# Patient Record
Sex: Male | Born: 1950 | Race: White | Hispanic: No | Marital: Married | Smoking: Former smoker
Health system: Southern US, Community
[De-identification: ages and names within clinical notes are randomized; demographics above are authoritative.]

## PROBLEM LIST (undated history)

## (undated) DIAGNOSIS — I25110 Atherosclerotic heart disease of native coronary artery with unstable angina pectoris: Secondary | ICD-10-CM

## (undated) DIAGNOSIS — I350 Nonrheumatic aortic (valve) stenosis: Secondary | ICD-10-CM

## (undated) DIAGNOSIS — E785 Hyperlipidemia, unspecified: Secondary | ICD-10-CM

## (undated) DIAGNOSIS — R011 Cardiac murmur, unspecified: Secondary | ICD-10-CM

## (undated) DIAGNOSIS — H269 Unspecified cataract: Secondary | ICD-10-CM

## (undated) DIAGNOSIS — E119 Type 2 diabetes mellitus without complications: Secondary | ICD-10-CM

## (undated) DIAGNOSIS — I10 Essential (primary) hypertension: Secondary | ICD-10-CM

## (undated) DIAGNOSIS — M1990 Unspecified osteoarthritis, unspecified site: Secondary | ICD-10-CM

## (undated) DIAGNOSIS — J45909 Unspecified asthma, uncomplicated: Secondary | ICD-10-CM

## (undated) HISTORY — DX: Atherosclerotic heart disease of native coronary artery with unstable angina pectoris: I25.110

## (undated) HISTORY — DX: Type 2 diabetes mellitus without complications: E11.9

## (undated) HISTORY — DX: Nonrheumatic aortic (valve) stenosis: I35.0

## (undated) HISTORY — DX: Unspecified asthma, uncomplicated: J45.909

## (undated) HISTORY — PX: COLONOSCOPY: SHX174

## (undated) HISTORY — DX: Essential (primary) hypertension: I10

## (undated) HISTORY — PX: CARDIAC CATHETERIZATION: SHX172

## (undated) HISTORY — DX: Unspecified cataract: H26.9

## (undated) HISTORY — DX: Cardiac murmur, unspecified: R01.1

## (undated) HISTORY — DX: Hyperlipidemia, unspecified: E78.5

## (undated) HISTORY — PX: WISDOM TOOTH EXTRACTION: SHX21

---
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
## 1998-08-12 ENCOUNTER — Ambulatory Visit (HOSPITAL_COMMUNITY): Admission: RE | Admit: 1998-08-12 | Discharge: 1998-08-12 | Payer: Self-pay | Admitting: *Deleted

## 1998-08-12 ENCOUNTER — Encounter: Payer: Self-pay | Admitting: *Deleted

## 1998-09-27 ENCOUNTER — Encounter: Admission: RE | Admit: 1998-09-27 | Discharge: 1998-12-26 | Payer: Self-pay | Admitting: *Deleted

## 2012-08-21 ENCOUNTER — Ambulatory Visit (INDEPENDENT_AMBULATORY_CARE_PROVIDER_SITE_OTHER): Payer: No Typology Code available for payment source | Admitting: Cardiovascular Disease

## 2012-08-21 ENCOUNTER — Encounter: Payer: Self-pay | Admitting: Cardiovascular Disease

## 2012-08-21 VITALS — BP 123/80 | HR 87 | Ht 69.0 in | Wt 230.0 lb

## 2012-08-21 DIAGNOSIS — E785 Hyperlipidemia, unspecified: Secondary | ICD-10-CM

## 2012-08-21 DIAGNOSIS — Z7189 Other specified counseling: Secondary | ICD-10-CM

## 2012-08-21 DIAGNOSIS — Z7689 Persons encountering health services in other specified circumstances: Secondary | ICD-10-CM | POA: Insufficient documentation

## 2012-08-21 DIAGNOSIS — E1165 Type 2 diabetes mellitus with hyperglycemia: Secondary | ICD-10-CM | POA: Insufficient documentation

## 2012-08-21 DIAGNOSIS — I10 Essential (primary) hypertension: Secondary | ICD-10-CM | POA: Insufficient documentation

## 2012-08-21 DIAGNOSIS — R42 Dizziness and giddiness: Secondary | ICD-10-CM

## 2012-08-21 DIAGNOSIS — E119 Type 2 diabetes mellitus without complications: Secondary | ICD-10-CM

## 2012-08-21 DIAGNOSIS — E782 Mixed hyperlipidemia: Secondary | ICD-10-CM | POA: Insufficient documentation

## 2012-08-21 NOTE — Patient Instructions (Addendum)
You are doing well. No medication changes were made.  If dizziness gets worse, consider cutting the losartan in 1/2 (25 mg daily) Call the office if you have any worsening Shortness of breath, chest pain  Please call us if you have new issues that need to be addressed before your next appt.  Your physician wants you to follow-up in: 12 months.  You will receive a reminder letter in the mail two months in advance. If you don't receive a letter, please call our office to schedule the follow-up appointment.

## 2012-08-21 NOTE — Assessment & Plan Note (Signed)
Records were requested from [facility].

## 2012-08-21 NOTE — Assessment & Plan Note (Addendum)
He states that diabetes numbers are slowly improving, still not at baseline. Dietary guide was given him today.

## 2012-08-21 NOTE — Assessment & Plan Note (Signed)
We have suggested he stay on his cholesterol medication.

## 2012-08-21 NOTE — Assessment & Plan Note (Signed)
We have encouraged continued exercise, careful diet management in an effort to lose weight. 

## 2012-08-21 NOTE — Assessment & Plan Note (Signed)
Orthostatic dizziness, worse when he bends over and then stands up. Uncertain if he is holding his breath when he bends over but he does have obesity which could be contributing to his symptoms. I suggested that he try not to bend at the waist and rather bent at the knees. No significant symptoms when upright and exerting himself. If symptoms get worse, I suggested he stay hydrated, cut his losartan in half.

## 2012-08-21 NOTE — Assessment & Plan Note (Signed)
Blood pressure is well controlled. If he continues to have dizziness with bending over, perhaps we could wean down the dose of his losartan.

## 2012-08-21 NOTE — Progress Notes (Signed)
Patient ID: Chase Saunders, male    DOB: 04/07/1950, 62 y.o.   MRN: [id number]  HPI Comments: 62 year old male who recently moved to the area from [place], with history of smoking for 20-25 years who stopped 20 years ago, history of diabetes, obesity, hypertension, who presents to establish care in our office.   He states that he has had some recent malaise of uncertain etiology. He attributes this to being off his medications for several months. For a period of time, he did not have insurance and was unable to afford his medications. Now he has insurance and is back on his medications. Hemoglobin A1c was previously in the 6 range, climbed up to the 9 range and reports it is better now. It is still not back to its baseline.   He reports having some lightheadedness and shortness of breath when he bends over and then stands up again. He walks his dog and when he bends over to help his dog or pick up something, with standing back up, he is symptomatic for several seconds.   Otherwise with activity such as walking his dog he has no symptoms. He is able to walk reasonable distances without any symptoms he reports having a stress test 4-5 years ago for chest pain that showed no abnormality.   He does report having a significant family history. Parents died of cancer in her 29s. Grandparents had coronary artery disease  EKG shows normal sinus rhythm with rate 87 beats a minute, no significant ST or T wave changes    Outpatient Encounter Prescriptions as of 08/21/2012  Medication Sig Dispense Refill  . aspirin EC 81 MG tablet Take 81 mg by mouth daily.      . Chromium Picolinate 1000 MCG TABS Take by mouth daily.      . Chromium-Cinnamon [phone] MCG-MG CAPS Take by mouth daily.      . citalopram (CELEXA) 20 MG tablet Take 20 mg by mouth daily.      . Fish Oil-Cholecalciferol (OMEGA-3 FISH OIL-VITAMIN D3) [phone] MG-UNIT CAPS Take by mouth daily.      . Glucosamine-Chondroitin-Vit D3  1500-1200-800 MG-MG-UNIT PACK Take by mouth daily.      . insulin detemir (LEVEMIR) 100 unit/ml SOLN Inject 68 Units into the skin daily at 10 pm.      . losartan (COZAAR) 50 MG tablet Take 50 mg by mouth daily.      Marland Kitchen lovastatin (MEVACOR) 40 MG tablet Take 40 mg by mouth at bedtime.      . Magnesium 400 MG CAPS Take by mouth daily.      . metFORMIN (GLUMETZA) 1000 MG (MOD) 24 hr tablet Take 1,000 mg by mouth daily with breakfast.      . Multiple Vitamins-Minerals (MENS MULTIVITAMIN PLUS) TABS Take by mouth daily.      . Triamcinolone Acetonide (NASACORT ALLERGY 24HR) 55 MCG/ACT AERO Place into the nose daily.         Review of Systems  Constitutional: Negative.   HENT: Negative.   Eyes: Negative.   Respiratory: Negative.   Cardiovascular: Negative.   Gastrointestinal: Negative.   Musculoskeletal: Negative.   Skin: Negative.   Neurological: Positive for dizziness.  Psychiatric/Behavioral: Negative.   All other systems reviewed and are negative.    BP 123/80  Pulse 87  Ht 5\' 9"  (1.753 m)  Wt 230 lb (104.327 kg)  BMI 33.95 kg/m2  Physical Exam  Nursing note and vitals reviewed. Constitutional: He is oriented to person, place,  and time. He appears well-developed and well-nourished.  HENT:  Head: Normocephalic.  Nose: Nose normal.  Mouth/Throat: Oropharynx is clear and moist.  Eyes: Conjunctivae are normal. Pupils are equal, round, and reactive to light.  Neck: Normal range of motion. Neck supple. No JVD present.  Cardiovascular: Normal rate, regular rhythm, S1 normal, S2 normal, normal heart sounds and intact distal pulses.  Exam reveals no gallop and no friction rub.   No murmur heard. Pulmonary/Chest: Effort normal and breath sounds normal. No respiratory distress. He has no wheezes. He has no rales. He exhibits no tenderness.  Abdominal: Soft. Bowel sounds are normal. He exhibits no distension. There is no tenderness.  Musculoskeletal: Normal range of motion. He exhibits  no edema and no tenderness.  Lymphadenopathy:    He has no cervical adenopathy.  Neurological: He is alert and oriented to person, place, and time. Coordination normal.  Skin: Skin is warm and dry. No rash noted. No erythema.  Psychiatric: He has a normal mood and affect. His behavior is normal. Judgment and thought content normal.      Assessment and Plan

## 2013-01-28 ENCOUNTER — Encounter: Payer: Self-pay | Admitting: Adult Health

## 2013-01-28 ENCOUNTER — Encounter (INDEPENDENT_AMBULATORY_CARE_PROVIDER_SITE_OTHER): Payer: Self-pay

## 2013-01-28 ENCOUNTER — Ambulatory Visit (INDEPENDENT_AMBULATORY_CARE_PROVIDER_SITE_OTHER): Payer: No Typology Code available for payment source | Admitting: Adult Health

## 2013-01-28 VITALS — BP 126/80 | HR 100 | Temp 97.8°F | Resp 14 | Ht 69.0 in | Wt 229.0 lb

## 2013-01-28 DIAGNOSIS — Z7689 Persons encountering health services in other specified circumstances: Secondary | ICD-10-CM

## 2013-01-28 DIAGNOSIS — E119 Type 2 diabetes mellitus without complications: Secondary | ICD-10-CM

## 2013-01-28 DIAGNOSIS — E785 Hyperlipidemia, unspecified: Secondary | ICD-10-CM

## 2013-01-28 DIAGNOSIS — R42 Dizziness and giddiness: Secondary | ICD-10-CM

## 2013-01-28 DIAGNOSIS — I10 Essential (primary) hypertension: Secondary | ICD-10-CM

## 2013-01-28 DIAGNOSIS — Z79899 Other long term (current) drug therapy: Secondary | ICD-10-CM

## 2013-01-28 DIAGNOSIS — Z7189 Other specified counseling: Secondary | ICD-10-CM

## 2013-01-28 NOTE — Assessment & Plan Note (Signed)
Check lipids. He will return for fasting labs.

## 2013-01-28 NOTE — Assessment & Plan Note (Addendum)
Symptoms occur occasionally. Currently he is asymptomatic. Suspect this may be related to drop in blood pressure with quick changes of position. Patient has been advised to change positions slowly. Maintain good hydration. Check metabolic panel

## 2013-01-28 NOTE — Progress Notes (Signed)
Pre visit review using our clinic review tool, if applicable. No additional management support is needed unless otherwise documented below in the visit note. 

## 2013-01-28 NOTE — Assessment & Plan Note (Signed)
Patient brought in medical records from previous PCP. Records reviewed and set to be scanned.

## 2013-01-28 NOTE — Patient Instructions (Signed)
   Thank you for choosing [facility] for your health care needs.  Please have your labs drawn at your earliest convenience. You will need to fast. You may drink water.  The results will be available through MyChart for your convenience. Please remember to activate this. The activation code is located at the end of this form.

## 2013-01-28 NOTE — Progress Notes (Signed)
Subjective:    Patient ID: Chase Saunders, male    DOB: Oct 10, 1950, 62 y.o.   MRN: [id number]  HPI  Pt is a pleasant 62 y/o male who presents to establish care. He was previously followed by Dr. Richard Miu in [place]. He moved back to the [place] area last year. He reports occasionally feeling lightheaded and dizziness with rapid change of positions. He has a hx of HTN but reports well controlled with medication. Also hx of diabetes but admits this is not as well controlled. Diet with some indiscretions. Otherwise he is feeling well.    Past Medical History  Diagnosis Date  . Asthma     Childhood  . Diabetes mellitus without complication   . Heart murmur   . Hypertension   . Hyperlipidemia      Past Surgical History  Procedure Laterality Date  . Tonsillectomy and adenoidectomy  1969     Family History  Problem Relation Age of Onset  . Alcohol abuse Mother   . Hypertension Mother   . Cancer Father 39    Lung Cancer - smoker  . Heart disease Father     Valve Replacement  . Diabetes Paternal Grandmother      History   Social History  . Marital Status: Married    Spouse Name: Rinaldo Cloud    Number of Children: 2  . Years of Education: 16   Occupational History  . Voice Over Artist     [employer]   Social History Main Topics  . Smoking status: Former Smoker -- 1.50 packs/day for 25 years    Quit date: 03/30/1997  . Smokeless tobacco: Not on file  . Alcohol Use: No  . Drug Use: No  . Sexual Activity: Not on file   Other Topics Concern  . Not on file   Social History Narrative   [name] grew up in [place], [place]. He attended [employer] and obtained his Bachelors in Special educational needs teacher. He moved to the area from [place], [place]. He lives at home with his wife. They have a son and daughter. They have 4 grandchildren (3 grandson, 1 granddaughter). 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 He enjoys fishing. He also enjoys  watching movies. He really considers himself a "home body".     Review of Systems  Constitutional: Negative.   HENT: Negative.   Eyes: Negative.   Respiratory: Negative.   Cardiovascular: Negative.   Gastrointestinal: Negative.   Endocrine: Negative.   Genitourinary: Negative.   Musculoskeletal: Positive for arthralgias.       Arthritis right hand ring finger.  Skin: Negative.   Allergic/Immunologic: Negative.   Neurological: Positive for dizziness and light-headedness.       Occasional episodes of low blood sugar.  Hematological: Negative.   Psychiatric/Behavioral: Negative.        Objective:   Physical Exam  Constitutional: He is oriented to person, place, and time.  Overweight pleasant male in no apparent distress  HENT:  Head: Normocephalic and atraumatic.  Right Ear: External ear normal.  Left Ear: External ear normal.  Mouth/Throat: Oropharynx is clear and moist. No oropharyngeal exudate.  Eyes: Conjunctivae and EOM are normal. Pupils are equal, round, and reactive to light.  Neck: Normal range of motion. No thyromegaly present.  Cardiovascular: Normal rate, regular rhythm and normal heart sounds.  Exam reveals no gallop and no friction rub.   No murmur heard. HR 88  Pulmonary/Chest: Effort normal and breath sounds normal.  No respiratory distress. He has no wheezes. He has no rales.  Abdominal: Soft. Bowel sounds are normal.  Musculoskeletal: Normal range of motion. He exhibits no edema and no tenderness.  Lymphadenopathy:    He has no cervical adenopathy.  Neurological: He is alert and oriented to person, place, and time.  Skin: Skin is warm and dry.  Psychiatric: He has a normal mood and affect. His behavior is normal. Judgment and thought content normal.          Assessment & Plan:

## 2013-01-28 NOTE — Assessment & Plan Note (Addendum)
Reports dietary indiscretions. Check hemoglobin A1c and urine microalbumin. Currently on metformin and Levemir 72 units in the morning

## 2013-01-31 ENCOUNTER — Other Ambulatory Visit (INDEPENDENT_AMBULATORY_CARE_PROVIDER_SITE_OTHER): Payer: No Typology Code available for payment source

## 2013-01-31 DIAGNOSIS — E119 Type 2 diabetes mellitus without complications: Secondary | ICD-10-CM

## 2013-01-31 DIAGNOSIS — E785 Hyperlipidemia, unspecified: Secondary | ICD-10-CM

## 2013-01-31 DIAGNOSIS — Z79899 Other long term (current) drug therapy: Secondary | ICD-10-CM

## 2013-01-31 LAB — CBC WITH DIFFERENTIAL/PLATELET
Basophils Absolute: 0.1 10*3/uL (ref 0.0–0.1)
Basophils Relative: 0.5 % (ref 0.0–3.0)
Eosinophils Absolute: 0.3 10*3/uL (ref 0.0–0.7)
Eosinophils Relative: 3.2 % (ref 0.0–5.0)
HCT: 42.1 % (ref 39.0–52.0)
Hemoglobin: 14.1 g/dL (ref 13.0–17.0)
Lymphocytes Relative: 24 % (ref 12.0–46.0)
Lymphs Abs: 2.4 10*3/uL (ref 0.7–4.0)
MCHC: 33.5 g/dL (ref 30.0–36.0)
MCV: 94.1 fl (ref 78.0–100.0)
Monocytes Absolute: 1 10*3/uL (ref 0.1–1.0)
Monocytes Relative: 9.4 % (ref 3.0–12.0)
Neutro Abs: 6.4 10*3/uL (ref 1.4–7.7)
Neutrophils Relative %: 62.9 % (ref 43.0–77.0)
Platelets: 296 10*3/uL (ref 150.0–400.0)
RBC: 4.47 Mil/uL (ref 4.22–5.81)
RDW: 13.7 % (ref 11.5–14.6)
WBC: 10.1 10*3/uL (ref 4.5–10.5)

## 2013-01-31 LAB — COMPREHENSIVE METABOLIC PANEL
ALT: 51 U/L (ref 0–53)
AST: 29 U/L (ref 0–37)
Albumin: 4 g/dL (ref 3.5–5.2)
Alkaline Phosphatase: 84 U/L (ref 39–117)
BUN: 15 mg/dL (ref 6–23)
CO2: 30 mEq/L (ref 19–32)
Calcium: 8.8 mg/dL (ref 8.4–10.5)
Chloride: 101 mEq/L (ref 96–112)
Creatinine, Ser: 1 mg/dL (ref 0.4–1.5)
GFR: 83.08 mL/min (ref >60.00)
Glucose, Bld: 113 mg/dL — ABNORMAL HIGH (ref 70–99)
Potassium: 4.6 mEq/L (ref 3.5–5.1)
Sodium: 137 mEq/L (ref 135–145)
Total Bilirubin: 0.6 mg/dL (ref 0.3–1.2)
Total Protein: 6.6 g/dL (ref 6.0–8.3)

## 2013-01-31 LAB — LIPID PANEL
Cholesterol: 146 mg/dL (ref 0–200)
HDL: 43.3 mg/dL (ref >39.00)
LDL Cholesterol: 80 mg/dL (ref 0–99)
Total CHOL/HDL Ratio: 3
Triglycerides: 115 mg/dL (ref 0.0–149.0)
VLDL: 23 mg/dL (ref 0.0–40.0)

## 2013-01-31 LAB — HEMOGLOBIN A1C: Hgb A1c MFr Bld: 8.4 % — ABNORMAL HIGH (ref 4.6–6.5)

## 2013-01-31 LAB — MICROALBUMIN / CREATININE URINE RATIO
Creatinine,U: 165.7 mg/dL
Microalb Creat Ratio: 0.2 mg/g (ref 0.0–30.0)
Microalb, Ur: 0.3 mg/dL (ref 0.0–1.9)

## 2013-02-03 ENCOUNTER — Encounter: Payer: Self-pay | Admitting: Adult Health

## 2013-02-04 NOTE — Telephone Encounter (Signed)
Pt came by office and wanted to let you know that he was going to increase dosage of the metformin.

## 2013-03-21 ENCOUNTER — Telehealth: Payer: Self-pay | Admitting: Adult Health

## 2013-03-21 NOTE — Telephone Encounter (Signed)
Pt dropped off paperwork.  Placed in Raquel's chart.  Pt dropped off new pt packet for his ?daughter, Frederik PearKristen Lickteig.  Kristen Erbes has not scheduled an appt.  Lu DuffelStuart Woollard did not wish to schedule an appt for her.  Mr. Jessy Otoorfleet stated that we needed to request Kristen's previous records before her appt.  Advised Mr. Jessy Otoorfleet that would be done at New pt visit and that Baxter HireKristen would need to call to set up an appt.  Mr. Jessy Otoorfleet walked out.  NPP for Kristen Mosher in pink folder up front.

## 2013-04-04 ENCOUNTER — Other Ambulatory Visit: Payer: Self-pay | Admitting: Adult Health

## 2013-04-16 ENCOUNTER — Encounter: Payer: Self-pay | Admitting: Adult Health

## 2013-05-06 ENCOUNTER — Other Ambulatory Visit: Payer: Self-pay | Admitting: Adult Health

## 2013-05-07 ENCOUNTER — Other Ambulatory Visit: Payer: Self-pay | Admitting: Adult Health

## 2013-05-07 MED ORDER — LOSARTAN POTASSIUM 50 MG PO TABS
50.0000 mg | ORAL_TABLET | Freq: Every day | ORAL | Status: DC
Start: 2013-05-07 — End: 2013-10-01

## 2013-05-07 NOTE — Telephone Encounter (Signed)
Spoke with pt about Metformin, refill request came in as 3 per day at 500mg , Pt states that you and him talked about him needing to take 4 per day.  Please advise

## 2013-05-08 ENCOUNTER — Other Ambulatory Visit: Payer: Self-pay | Admitting: Adult Health

## 2013-05-08 MED ORDER — METFORMIN HCL ER 500 MG PO TB24: ORAL_TABLET | ORAL | Status: DC

## 2013-06-03 ENCOUNTER — Telehealth: Payer: Self-pay | Admitting: *Deleted

## 2013-06-03 MED ORDER — INSULIN PEN NEEDLE 31G X 8 MM MISC: Status: AC

## 2013-06-03 NOTE — Telephone Encounter (Signed)
Pharmacy Note:  Need new Rx for b-d needles for levemir.

## 2013-06-25 ENCOUNTER — Other Ambulatory Visit: Payer: Self-pay | Admitting: Adult Health

## 2013-09-24 ENCOUNTER — Other Ambulatory Visit: Payer: Self-pay | Admitting: Adult Health

## 2013-09-24 NOTE — Telephone Encounter (Signed)
Sent mychart message on need for appt 

## 2013-10-01 ENCOUNTER — Ambulatory Visit (INDEPENDENT_AMBULATORY_CARE_PROVIDER_SITE_OTHER): Payer: BC Managed Care – PPO | Admitting: Adult Health

## 2013-10-01 ENCOUNTER — Encounter: Payer: Self-pay | Admitting: Adult Health

## 2013-10-01 VITALS — BP 111/74 | HR 96 | Temp 98.4°F | Resp 14 | Ht 69.0 in | Wt 227.0 lb

## 2013-10-01 DIAGNOSIS — R011 Cardiac murmur, unspecified: Secondary | ICD-10-CM

## 2013-10-01 DIAGNOSIS — IMO0002 Reserved for concepts with insufficient information to code with codable children: Secondary | ICD-10-CM

## 2013-10-01 DIAGNOSIS — IMO0001 Reserved for inherently not codable concepts without codable children: Secondary | ICD-10-CM

## 2013-10-01 DIAGNOSIS — E1165 Type 2 diabetes mellitus with hyperglycemia: Secondary | ICD-10-CM

## 2013-10-01 LAB — HM DIABETES FOOT EXAM: HM Diabetic Foot Exam: NORMAL

## 2013-10-01 LAB — HEMOGLOBIN A1C: Hgb A1c MFr Bld: 8.4 % — ABNORMAL HIGH (ref 4.6–6.5)

## 2013-10-01 NOTE — Progress Notes (Signed)
Patient ID: Chase Saunders, male   DOB: 09-19-1950, 63 y.o.   MRN: [id number]   Subjective:    Patient ID: Chase Saunders, male    DOB: 09-19-1950, 63 y.o.   MRN: [id number]  HPI  Pt presents for DM type 2 f/u. Last A1c was 8.4%. He is currently taking metformin 500 mg 1 tablet with breakfast and 1 with dinner. The order is for 1000 mg bid. He is injecting Levemir 80 units every morning. Not checking his blood glucoses regularly 2/2 painful fingers. Some ongoing dietary indiscretions but he reports that he and his wife have spoken and they are determined to improve their diet. Having trouble exercising. He normally walks but he reports it has been too hot. Takes his grandson to school and has to wake up early to do that. By the time he returns the temperature is unbearable.  Past Medical History  Diagnosis Date  . Asthma     Childhood  . Diabetes mellitus without complication   . Heart murmur   . Hypertension   . Hyperlipidemia   . Cataract     Current Outpatient Prescriptions on File Prior to Visit  Medication Sig Dispense Refill  . aspirin EC 81 MG tablet Take 81 mg by mouth daily.      . Chromium-Cinnamon [phone] MCG-MG CAPS Take by mouth daily.      . citalopram (CELEXA) 20 MG tablet TAKE 1 TABLET BY MOUTH EVERY DAY  30 tablet  0  . Fish Oil-Cholecalciferol (OMEGA-3 FISH OIL-VITAMIN D3) [id number] MG-UNIT CAPS Take by mouth daily.      . insulin detemir (LEVEMIR) 100 unit/ml SOLN Inject 80 Units into the skin every morning.       . Insulin Pen Needle 31G X 8 MM MISC Use as directed  100 each  3  . lovastatin (MEVACOR) 40 MG tablet TAKE 1 TABLET BY MOUTH AT BEDTIME  90 tablet  1  . Multiple Vitamins-Minerals (MENS MULTIVITAMIN PLUS) TABS Take by mouth daily.       No current facility-administered medications on file prior to visit.     Review of Systems  Constitutional: Positive for fatigue.  Respiratory: Positive for shortness of breath (with exertion).     Cardiovascular: Negative for chest pain, palpitations and leg swelling.  Gastrointestinal: Negative.   Endocrine: Negative.   Musculoskeletal: Negative.   Neurological: Positive for light-headedness (with bending at waist).  Psychiatric/Behavioral: Negative.   All other systems reviewed and are negative.      Objective:  BP 111/74  Pulse 96  Temp(Src) 98.4 F (36.9 C) (Oral)  Resp 14  Ht 5\' 9"  (1.753 m)  Wt 227 lb (102.967 kg)  BMI 33.51 kg/m2  SpO2 94%   Physical Exam  Constitutional: He is oriented to person, place, and time. He appears well-developed and well-nourished. No distress.  HENT:  Head: Normocephalic and atraumatic.  Eyes: Conjunctivae and EOM are normal.  Cardiovascular: Normal rate, regular rhythm and intact distal pulses.  Exam reveals no gallop and no friction rub.   Murmur heard. Systolic murmur  Pulmonary/Chest: Effort normal and breath sounds normal. No respiratory distress. He has no wheezes. He has no rales.  Musculoskeletal: Normal range of motion.  Neurological: He is alert and oriented to person, place, and time.  Skin: Skin is warm and dry.  Psychiatric: He has a normal mood and affect. His behavior is normal. Judgment and thought content normal.          Assessment &  Plan:   1. Diabetes type 2, uncontrolled Referring to [facility] for diabetic education. Provided with several pieces of literature for diabetes management, diet management. Check A1c. Adjust meds if necessary. We had long discussion about poorly controlled diabetes and end organ damage. I continue to encourage him about controlling his diabetes. Follow up in 3 month. If still not controlled will refer to Endocrine. - Hemoglobin A1c - Ambulatory referral to diabetic education  2. Heart murmur, systolic Aortic murmur, systolic. He is experiencing some lightheadedness with bending over and shortness of breath with exertion; worse with going up stairs. Last echo was "many  years ago". He is followed by Dr. Mariah Milling. I will order echo to be done at Dr. Windell Hummingbird office and follow up with him if necessary. - 2D Echocardiogram without contrast; Future

## 2013-10-01 NOTE — Progress Notes (Signed)
Pre visit review using our clinic review tool, if applicable. No additional management support is needed unless otherwise documented below in the visit note. 

## 2013-10-02 ENCOUNTER — Other Ambulatory Visit: Payer: Self-pay | Admitting: Adult Health

## 2013-10-02 ENCOUNTER — Encounter: Payer: Self-pay | Admitting: Adult Health

## 2013-10-02 MED ORDER — GLYBURIDE 5 MG PO TABS: 5.0000 mg | ORAL_TABLET | Freq: Every day | ORAL | Status: DC

## 2013-10-24 ENCOUNTER — Other Ambulatory Visit (INDEPENDENT_AMBULATORY_CARE_PROVIDER_SITE_OTHER): Payer: BC Managed Care – PPO

## 2013-10-24 ENCOUNTER — Ambulatory Visit (INDEPENDENT_AMBULATORY_CARE_PROVIDER_SITE_OTHER): Payer: BC Managed Care – PPO | Admitting: Cardiovascular Disease

## 2013-10-24 ENCOUNTER — Encounter: Payer: Self-pay | Admitting: Cardiovascular Disease

## 2013-10-24 ENCOUNTER — Other Ambulatory Visit: Payer: Self-pay

## 2013-10-24 VITALS — BP 120/72 | HR 80 | Ht 70.0 in | Wt 225.2 lb

## 2013-10-24 DIAGNOSIS — R0602 Shortness of breath: Secondary | ICD-10-CM

## 2013-10-24 DIAGNOSIS — R42 Dizziness and giddiness: Secondary | ICD-10-CM

## 2013-10-24 DIAGNOSIS — I359 Nonrheumatic aortic valve disorder, unspecified: Secondary | ICD-10-CM

## 2013-10-24 DIAGNOSIS — R011 Cardiac murmur, unspecified: Secondary | ICD-10-CM

## 2013-10-24 DIAGNOSIS — I10 Essential (primary) hypertension: Secondary | ICD-10-CM

## 2013-10-24 DIAGNOSIS — E785 Hyperlipidemia, unspecified: Secondary | ICD-10-CM

## 2013-10-24 DIAGNOSIS — E119 Type 2 diabetes mellitus without complications: Secondary | ICD-10-CM

## 2013-10-24 NOTE — Assessment & Plan Note (Signed)
Blood pressure is well controlled on today's visit. No changes made to the medications. 

## 2013-10-24 NOTE — Assessment & Plan Note (Signed)
Recommended he restart 3 metformin per day. He will continue to monitor his sugars. These run high, he we'll restart one half pill glyburide

## 2013-10-24 NOTE — Progress Notes (Signed)
Patient ID: Chase Saunders, male    DOB: 11-07-1950, 64 y.o.   MRN: [id number]  HPI Comments: 63 year old male who recently moved to the area from [place], with history of smoking for 20-25 years who stopped 20 years ago, history of diabetes, obesity, hypertension, who presents   Prelim echocardiogram done today shows mild to moderate aortic valve stenosis, normal ejection fraction. Final reading is pending  In followup today, he reports that he is doing well. Denies having any significant chest pain with exertion. He does have mild chronic shortness of breath with exertion, worse with hills and stairs.  Brother recently had bypass surgery  Is working on his diabetes. Increased his metformin up to 3 pills per day, started glyburide and reports that his sugar dropped several times. He has since stopped the glyburide and went back to the low-dose metformin.  Hemoglobin A1c 8.4  On his prior clinic visit, reported having some lightheadedness and shortness of breath when he bends over and then stands up again.  He walks his dog and when he bends over to help his dog or pick up something, with standing back up, he is symptomatic for several seconds.    He does report having a significant family history. Parents died of cancer in her 27s. Grandparents had coronary artery disease  EKG shows normal sinus rhythm with rate 80 beats a minute, no significant ST or T wave changes    Outpatient Encounter Prescriptions as of 10/24/2013  Medication Sig  . aspirin EC 81 MG tablet Take 81 mg by mouth daily.  . Chromium-Cinnamon [phone] MCG-MG CAPS Take by mouth daily.  . citalopram (CELEXA) 20 MG tablet TAKE 1 TABLET BY MOUTH EVERY DAY  . Fish Oil-Cholecalciferol (OMEGA-3 FISH OIL-VITAMIN D3) [phone] MG-UNIT CAPS Take by mouth daily.  . insulin detemir (LEVEMIR) 100 unit/ml SOLN Inject 80 Units into the skin every morning.   . Insulin Pen Needle 31G X 8 MM MISC Use as directed  . losartan  (COZAAR) 50 MG tablet Take 25 mg by mouth daily.  Marland Kitchen lovastatin (MEVACOR) 40 MG tablet TAKE 1 TABLET BY MOUTH AT BEDTIME  . metFORMIN (GLUCOPHAGE-XR) 500 MG 24 hr tablet Take 500 mg by mouth daily with breakfast.  He takes 2 per day   . Misc Natural Products (OSTEO BI-FLEX JOINT SHIELD PO) Take by mouth 3 (three) times daily.  . Multiple Vitamins-Minerals (MENS MULTIVITAMIN PLUS) TABS Take by mouth daily.  Marland Kitchen  glyBURIDE (DIABETA) 5 MG tablet Take 1 tablet (5 mg total) by mouth daily with breakfast. Currently is not taking      Review of Systems  Constitutional: Negative.   HENT: Negative.   Eyes: Negative.   Respiratory: Positive for shortness of breath.   Cardiovascular: Negative.   Gastrointestinal: Negative.   Endocrine: Negative.   Musculoskeletal: Negative.   Skin: Negative.   Allergic/Immunologic: Negative.   Neurological: Negative.   Hematological: Negative.   Psychiatric/Behavioral: Negative.   All other systems reviewed and are negative.   BP 120/72  Pulse 80  Ht 5\' 10"  (1.778 m)  Wt 225 lb 4 oz (102.173 kg)  BMI 32.32 kg/m2  Physical Exam  Nursing note and vitals reviewed. Constitutional: He is oriented to person, place, and time. He appears well-developed and well-nourished.  HENT:  Head: Normocephalic.  Nose: Nose normal.  Mouth/Throat: Oropharynx is clear and moist.  Eyes: Conjunctivae are normal. Pupils are equal, round, and reactive to light.  Neck: Normal range of motion. Neck supple.  No JVD present.  Cardiovascular: Normal rate, regular rhythm, S1 normal, S2 normal, normal heart sounds and intact distal pulses.  Exam reveals no gallop and no friction rub.   No murmur heard. Pulmonary/Chest: Effort normal and breath sounds normal. No respiratory distress. He has no wheezes. He has no rales. He exhibits no tenderness.  Abdominal: Soft. Bowel sounds are normal. He exhibits no distension. There is no tenderness.  Musculoskeletal: Normal range of motion. He  exhibits no edema and no tenderness.  Lymphadenopathy:    He has no cervical adenopathy.  Neurological: He is alert and oriented to person, place, and time. Coordination normal.  Skin: Skin is warm and dry. No rash noted. No erythema.  Psychiatric: He has a normal mood and affect. His behavior is normal. Judgment and thought content normal.      Assessment and Plan

## 2013-10-24 NOTE — Assessment & Plan Note (Signed)
We have encouraged continued exercise, careful diet management in an effort to lose weight. 

## 2013-10-24 NOTE — Patient Instructions (Addendum)
You are doing well. No medication changes were made.  Please increase the metformin back up to 3 a day If ok, then restart 1/2 glyburide in the morning   Please call us if you have new issues that need to be addressed before your next appt.  Your physician wants you to follow-up in: 12 months.  You will receive a reminder letter in the mail two months in advance. If you don't receive a letter, please call our office to schedule the follow-up appointment.

## 2013-10-24 NOTE — Assessment & Plan Note (Signed)
Cholesterol is at goal on the current lipid regimen. No changes to the medications were made.  

## 2013-10-25 ENCOUNTER — Encounter: Payer: Self-pay | Admitting: Adult Health

## 2013-10-28 ENCOUNTER — Other Ambulatory Visit: Payer: Self-pay

## 2013-10-29 ENCOUNTER — Other Ambulatory Visit: Payer: Self-pay

## 2013-10-29 MED ORDER — LOVASTATIN 20 MG PO TABS: 20.0000 mg | ORAL_TABLET | Freq: Every day | ORAL | Status: DC

## 2013-10-29 MED ORDER — CITALOPRAM HYDROBROMIDE 20 MG PO TABS: ORAL_TABLET | ORAL | Status: DC

## 2013-10-29 MED ORDER — INSULIN DETEMIR 100 UNIT/ML FLEXPEN: 80.0000 [IU] | Freq: Every morning | SUBCUTANEOUS | Status: DC

## 2013-10-29 MED ORDER — LOVASTATIN 40 MG PO TABS: 40.0000 mg | ORAL_TABLET | Freq: Every day | ORAL | Status: DC

## 2013-10-29 MED ORDER — LOSARTAN POTASSIUM 50 MG PO TABS: 25.0000 mg | ORAL_TABLET | Freq: Every day | ORAL | Status: DC

## 2013-10-29 NOTE — Telephone Encounter (Signed)
Lovastatin  sent to pharmacy in error. Called to notify pharmacy it was deleted and correct dose was sent to pharmacy.

## 2013-11-03 ENCOUNTER — Other Ambulatory Visit: Payer: Self-pay | Admitting: *Deleted

## 2013-11-03 MED ORDER — INSULIN DETEMIR 100 UNIT/ML FLEXPEN: PEN_INJECTOR | SUBCUTANEOUS | Status: DC

## 2013-11-03 NOTE — Telephone Encounter (Signed)
Fax from pharmacy, pt requesting Levemir pens, not vial. New Rx sent to pharmacy

## 2014-02-07 ENCOUNTER — Encounter: Payer: Self-pay | Admitting: Cardiovascular Disease

## 2014-02-24 ENCOUNTER — Other Ambulatory Visit: Payer: Self-pay | Admitting: *Deleted

## 2014-02-24 MED ORDER — CITALOPRAM HYDROBROMIDE 20 MG PO TABS: ORAL_TABLET | ORAL | Status: DC

## 2014-06-25 ENCOUNTER — Telehealth: Payer: Self-pay | Admitting: *Deleted

## 2014-06-25 NOTE — Telephone Encounter (Signed)
Fax from pharmacy requesting Metformin refill.  Review of chart, pts last OV 7.29.15 by Chase Saunders.  Pt never seen by you.  Called left message on VM to return call to schedule appoint.

## 2014-06-25 NOTE — Telephone Encounter (Signed)
Thanks

## 2014-12-24 ENCOUNTER — Other Ambulatory Visit: Payer: Self-pay

## 2014-12-24 MED ORDER — LOSARTAN POTASSIUM 50 MG PO TABS: 25.0000 mg | ORAL_TABLET | Freq: Every day | ORAL | Status: DC

## 2014-12-24 NOTE — Telephone Encounter (Signed)
Patient was seen last by Chase Saunders, please advise refill?

## 2015-01-08 ENCOUNTER — Telehealth: Payer: Self-pay | Admitting: Cardiovascular Disease

## 2015-01-08 NOTE — Telephone Encounter (Signed)
Patient not interested in scheduling a fu at thos time.   Will call office when new insurance is active first of new year.     Deleting Recall.

## 2015-05-25 ENCOUNTER — Other Ambulatory Visit: Payer: Self-pay | Admitting: Family Medicine

## 2015-05-25 DIAGNOSIS — Z136 Encounter for screening for cardiovascular disorders: Secondary | ICD-10-CM

## 2015-05-25 DIAGNOSIS — Z87891 Personal history of nicotine dependence: Secondary | ICD-10-CM

## 2015-07-16 ENCOUNTER — Other Ambulatory Visit: Payer: Self-pay | Admitting: Family Medicine

## 2015-07-16 ENCOUNTER — Ambulatory Visit
Admission: RE | Admit: 2015-07-16 | Discharge: 2015-07-16 | Disposition: A | Discharge status: Home / Self Care | Payer: Medicare PPO | Source: Ambulatory Visit | Attending: Family Medicine | Admitting: Family Medicine

## 2015-07-16 DIAGNOSIS — Z136 Encounter for screening for cardiovascular disorders: Secondary | ICD-10-CM

## 2015-07-16 DIAGNOSIS — Z87891 Personal history of nicotine dependence: Secondary | ICD-10-CM

## 2016-04-03 ENCOUNTER — Ambulatory Visit
Admission: RE | Admit: 2016-04-03 | Discharge: 2016-04-03 | Disposition: A | Discharge status: Home / Self Care | Payer: Medicare PPO | Source: Ambulatory Visit | Attending: Cardiology | Admitting: Cardiology

## 2016-04-03 ENCOUNTER — Other Ambulatory Visit: Payer: Self-pay | Admitting: Cardiology

## 2016-04-03 DIAGNOSIS — R011 Cardiac murmur, unspecified: Secondary | ICD-10-CM

## 2017-10-23 ENCOUNTER — Ambulatory Visit
Admission: RE | Admit: 2017-10-23 | Discharge: 2017-10-23 | Disposition: A | Discharge status: Home / Self Care | Payer: Medicare PPO | Source: Ambulatory Visit | Attending: Family Medicine | Admitting: Family Medicine

## 2017-10-23 ENCOUNTER — Other Ambulatory Visit: Payer: Self-pay | Admitting: Family Medicine

## 2017-10-23 DIAGNOSIS — S60212A Contusion of left wrist, initial encounter: Secondary | ICD-10-CM

## 2018-03-25 DIAGNOSIS — F411 Generalized anxiety disorder: Secondary | ICD-10-CM | POA: Diagnosis not present

## 2018-03-25 DIAGNOSIS — Z23 Encounter for immunization: Secondary | ICD-10-CM | POA: Diagnosis not present

## 2018-03-25 DIAGNOSIS — I10 Essential (primary) hypertension: Secondary | ICD-10-CM | POA: Diagnosis not present

## 2018-03-25 DIAGNOSIS — Z125 Encounter for screening for malignant neoplasm of prostate: Secondary | ICD-10-CM | POA: Diagnosis not present

## 2018-03-25 DIAGNOSIS — Z1389 Encounter for screening for other disorder: Secondary | ICD-10-CM | POA: Diagnosis not present

## 2018-03-25 DIAGNOSIS — Z0000 Encounter for general adult medical examination without abnormal findings: Secondary | ICD-10-CM | POA: Diagnosis not present

## 2018-03-25 DIAGNOSIS — I350 Nonrheumatic aortic (valve) stenosis: Secondary | ICD-10-CM | POA: Diagnosis not present

## 2018-03-25 DIAGNOSIS — E1169 Type 2 diabetes mellitus with other specified complication: Secondary | ICD-10-CM | POA: Diagnosis not present

## 2018-03-25 DIAGNOSIS — E7800 Pure hypercholesterolemia, unspecified: Secondary | ICD-10-CM | POA: Diagnosis not present

## 2018-03-25 DIAGNOSIS — L853 Xerosis cutis: Secondary | ICD-10-CM | POA: Diagnosis not present

## 2018-03-25 DIAGNOSIS — Z1211 Encounter for screening for malignant neoplasm of colon: Secondary | ICD-10-CM | POA: Diagnosis not present

## 2018-06-20 ENCOUNTER — Telehealth: Payer: Self-pay

## 2018-06-20 NOTE — Telephone Encounter (Signed)
Called patient.  No answer. LMOV.  Need to change patients appointment to an Evisit.  Possibly move patients appointment up to April.

## 2018-06-25 NOTE — Telephone Encounter (Signed)
Called patient.  No answer/ LMOV.  Need to change appointment to a telehealth visit 

## 2018-07-08 ENCOUNTER — Ambulatory Visit: Payer: Medicare PPO | Admitting: Cardiovascular Disease

## 2018-07-09 DIAGNOSIS — M79674 Pain in right toe(s): Secondary | ICD-10-CM | POA: Diagnosis not present

## 2018-09-05 DIAGNOSIS — R350 Frequency of micturition: Secondary | ICD-10-CM | POA: Diagnosis not present

## 2018-09-30 DIAGNOSIS — I350 Nonrheumatic aortic (valve) stenosis: Secondary | ICD-10-CM | POA: Diagnosis not present

## 2018-09-30 DIAGNOSIS — E1169 Type 2 diabetes mellitus with other specified complication: Secondary | ICD-10-CM | POA: Diagnosis not present

## 2018-09-30 DIAGNOSIS — I10 Essential (primary) hypertension: Secondary | ICD-10-CM | POA: Diagnosis not present

## 2018-09-30 DIAGNOSIS — E7800 Pure hypercholesterolemia, unspecified: Secondary | ICD-10-CM | POA: Diagnosis not present

## 2018-09-30 DIAGNOSIS — F411 Generalized anxiety disorder: Secondary | ICD-10-CM | POA: Diagnosis not present

## 2018-10-14 DIAGNOSIS — E7800 Pure hypercholesterolemia, unspecified: Secondary | ICD-10-CM | POA: Diagnosis not present

## 2018-10-14 DIAGNOSIS — E1169 Type 2 diabetes mellitus with other specified complication: Secondary | ICD-10-CM | POA: Diagnosis not present

## 2018-12-19 DIAGNOSIS — E113213 Type 2 diabetes mellitus with mild nonproliferative diabetic retinopathy with macular edema, bilateral: Secondary | ICD-10-CM | POA: Diagnosis not present

## 2018-12-19 DIAGNOSIS — H43313 Vitreous membranes and strands, bilateral: Secondary | ICD-10-CM | POA: Diagnosis not present

## 2019-04-07 DIAGNOSIS — Z1211 Encounter for screening for malignant neoplasm of colon: Secondary | ICD-10-CM | POA: Diagnosis not present

## 2019-04-07 DIAGNOSIS — F411 Generalized anxiety disorder: Secondary | ICD-10-CM | POA: Diagnosis not present

## 2019-04-07 DIAGNOSIS — Z125 Encounter for screening for malignant neoplasm of prostate: Secondary | ICD-10-CM | POA: Diagnosis not present

## 2019-04-07 DIAGNOSIS — Z0000 Encounter for general adult medical examination without abnormal findings: Secondary | ICD-10-CM | POA: Diagnosis not present

## 2019-04-07 DIAGNOSIS — E1169 Type 2 diabetes mellitus with other specified complication: Secondary | ICD-10-CM | POA: Diagnosis not present

## 2019-04-07 DIAGNOSIS — I10 Essential (primary) hypertension: Secondary | ICD-10-CM | POA: Diagnosis not present

## 2019-04-07 DIAGNOSIS — E7800 Pure hypercholesterolemia, unspecified: Secondary | ICD-10-CM | POA: Diagnosis not present

## 2019-05-07 DIAGNOSIS — H40003 Preglaucoma, unspecified, bilateral: Secondary | ICD-10-CM | POA: Diagnosis not present

## 2019-05-20 DIAGNOSIS — H40003 Preglaucoma, unspecified, bilateral: Secondary | ICD-10-CM | POA: Diagnosis not present

## 2019-06-23 ENCOUNTER — Encounter: Payer: Self-pay | Admitting: Cardiovascular Disease

## 2019-06-23 ENCOUNTER — Other Ambulatory Visit: Payer: Self-pay

## 2019-06-23 ENCOUNTER — Ambulatory Visit (INDEPENDENT_AMBULATORY_CARE_PROVIDER_SITE_OTHER): Payer: Medicare HMO | Admitting: Cardiovascular Disease

## 2019-06-23 VITALS — BP 122/66 | HR 89 | Ht 69.5 in | Wt 212.5 lb

## 2019-06-23 DIAGNOSIS — I10 Essential (primary) hypertension: Secondary | ICD-10-CM

## 2019-06-23 DIAGNOSIS — I359 Nonrheumatic aortic valve disorder, unspecified: Secondary | ICD-10-CM

## 2019-06-23 DIAGNOSIS — R5383 Other fatigue: Secondary | ICD-10-CM | POA: Diagnosis not present

## 2019-06-23 NOTE — Patient Instructions (Addendum)
Echo for aortic valve stenosis On a Monday only  Talk with PMD, About fatigue Check thyroid, vit D, B12, testosterone   Medication Instructions:  No changes  If you need a refill on your cardiac medications before your next appointment, please call your pharmacy.    Lab work: No new labs needed   If you have labs (blood work) drawn today and your tests are completely normal, you will receive your results only by: Marland Kitchen MyChart Message (if you have MyChart) OR . A paper copy in the mail If you have any lab test that is abnormal or we need to change your treatment, we will call you to review the results.   Testing/Procedures: Your physician has requested that you have an echocardiogram. Echocardiography is a painless test that uses sound waves to create images of your heart. It provides your doctor with information about the size and shape of your heart and how well your heart's chambers and valves are working. This procedure takes approximately one hour. There are no restrictions for this procedure.     Follow-Up: At [facility], you and your health needs are our priority.  As part of our continuing mission to provide you with exceptional heart care, we have created designated Provider Care Teams.  These Care Teams include your primary Cardiologist (physician) and Advanced Practice Providers (APPs -  Physician Assistants and Nurse Practitioners) who all work together to provide you with the care you need, when you need it.  . You will need a follow up appointment in 12 months   . Providers on your designated Care Team:   . Nicolasa Ducking, NP . Eula Listen, PA-C . Marisue Ivan, PA-C  Any Other Special Instructions Will Be Listed Below (If Applicable).  For educational health videos Log in to : [email or web address] Or : [email or web address], password : [email or web address]

## 2019-06-23 NOTE — Progress Notes (Signed)
Cardiology Office Note  Date:  06/23/2019   ID:  Chase Saunders, DOB October 23, 1950, MRN [id number]  PCP:  Asencion Gowda.[name], MD   Chief Complaint  Patient presents with  . New Patient (Initial Visit)    Pt states light headed when leaning over then up comes back up. BP running mildly low. Meds verbally reviewed w/ pt.     HPI:  69 year old male  with history of smoking for 20-25 years who stopped 20 years ago,  history of diabetes,  obesity,  hypertension,  who presents  moderate aortic valve stenosis in 2015, normal ejection fraction Who presents for lightheadedness when bending over, fatigue, aortic valve  Works at [employer], long hours No regular exercise program No plan on retiring Some stress at work  Lab work reviewed Prior HBA1C 8.4 in 2015 HAB1c recently:8 Total cholesterol 133 LDL 49  Weight 209 propr weight in 2015 was 225-227  Reports sleeping well, chronic fatigue Some dizziness when bending over and then standing back up Not on a regular basis  Echo in 2015, reviewed with him - Left ventricle: The cavity size was normal. Wall thickness was  normal. Systolic function was normal. The estimated ejection  fraction was in the range of 55% to 60%. Wall motion was normal;  there were no regional wall motion abnormalities. Doppler  parameters are consistent with abnormal left ventricular  relaxation (grade 1 diastolic dysfunction).  - Aortic valve: A bicuspid morphology cannot be excluded; mildly  thickened, severely calcified leaflets. There was moderate  stenosis. Mean gradient (S): 24 mm Hg. Valve area (VTI): 1.28  cm^2.  - Left atrium: The atrium was mildly dilated.    Family history Brother recently had bypass surgery  EKG personally reviewed by myself on todays visit Shows normal sinus rhythm rate 89 bpm no significant ST-T wave changes   PMH:   has a past medical history of Asthma, Cataract, Diabetes mellitus without complication  (HCC), Heart murmur, Hyperlipidemia, and Hypertension.  PSH:    Past Surgical History:  Procedure Laterality Date  . TONSILLECTOMY AND ADENOIDECTOMY  1969    Current Outpatient Medications  Medication Sig Dispense Refill  . aspirin EC 81 MG tablet Take 81 mg by mouth daily.    . Chromium-Cinnamon [phone] MCG-MG CAPS Take by mouth daily.    . citalopram (CELEXA) 20 MG tablet TAKE 1 TABLET BY MOUTH EVERY DAY 30 tablet 2  . Insulin Detemir (LEVEMIR FLEXTOUCH) 100 UNIT/ML Pen Inject 0.8 mLs (80 Units total) into the skin every morning. 8 pen 5  . Insulin Pen Needle 31G X 8 MM MISC Use as directed 100 each 3  . losartan (COZAAR) 50 MG tablet Take 0.5 tablets (25 mg total) by mouth daily. 30 tablet 0  . lovastatin (MEVACOR) 40 MG tablet Take 1 tablet (40 mg total) by mouth at bedtime. 90 tablet 3  . metFORMIN (GLUCOPHAGE-XR) 500 MG 24 hr tablet Take 500 mg by mouth daily with breakfast.     . Multiple Vitamins-Minerals (MENS MULTIVITAMIN PLUS) TABS Take by mouth daily.     No current facility-administered medications for this visit.     Allergies:   Patient has no known allergies.   Social History:  The patient  reports that he quit smoking about 22 years ago. He has a 37.50 pack-year smoking history. He does not have any smokeless tobacco history on file. He reports that he does not drink alcohol or use drugs.   Family History:   family history includes  Alcohol abuse in his mother; Cancer (age of onset: 60) in his father; Diabetes in his paternal grandmother; Heart disease in his father; Hypertension in his mother.   Review of Systems: Review of Systems  Constitutional: Positive for malaise/fatigue.  HENT: Negative.   Respiratory: Negative.   Cardiovascular: Negative.   Gastrointestinal: Negative.   Musculoskeletal: Negative.   Neurological: Positive for dizziness.  Psychiatric/Behavioral: Negative.   All other systems reviewed and are negative.   PHYSICAL EXAM: VS:  BP 122/66  (BP Location: Right Arm, Patient Position: Sitting, Cuff Size: Normal)   Pulse 89   Ht 5' 9.5" (1.765 m)   Wt 212 lb 8 oz (96.4 kg)   SpO2 95%   BMI 30.93 kg/m  , BMI Body mass index is 30.93 kg/m. GEN: Well nourished, well developed, in no acute distress HEENT: normal Neck: no JVD, carotid bruits, or masses Cardiac: RRR; 2/6 systolic ejection murmur right sternal border No rubs, or gallops,no edema  Respiratory:  clear to auscultation bilaterally, normal work of breathing GI: soft, nontender, nondistended, + BS MS: no deformity or atrophy Skin: warm and dry, no rash Neuro:  Strength and sensation are intact Psych: euthymic mood, full affect  Recent Labs: No results found for requested labs within last 8760 hours.    Lipid Panel Lab Results  Component Value Date   CHOL 146 01/31/2013   HDL 43.30 01/31/2013   LDLCALC 80 01/31/2013   TRIG 115.0 01/31/2013      Wt Readings from Last 3 Encounters:  06/23/19 212 lb 8 oz (96.4 kg)  10/24/13 225 lb 4 oz (102.2 kg)  10/01/13 227 lb (103 kg)     ASSESSMENT AND PLAN:  Problem List Items Addressed This Visit      Cardiology Problems   Essential hypertension   Relevant Orders   EKG 12-Lead    Other Visit Diagnoses    Aortic valve disease    -  Primary   Relevant Orders   ECHOCARDIOGRAM COMPLETE   Other fatigue         Aortic valve stenosis Murmur on exam, prior echocardiogram unable to exclude bicuspid aortic valve with moderate stenosis at that time 2015 Discussed with him in detail We have ordered repeat echocardiogram  Fatigue Etiology unclear, recommend various labs primary care could check including testosterone vitamin D B12 thyroid Recommended regular walking program, weight loss  Essential hypertension Blood pressure stable Given dizziness no changes made Lifestyle modification recommended  Disposition:   F/U  12 months   Total encounter time more than 45 minutes  Greater than 50% was spent in  counseling and coordination of care with the patient    Signed, Esmond Plants, M.D., Ph.D. [place], [place]

## 2019-07-28 DIAGNOSIS — B029 Zoster without complications: Secondary | ICD-10-CM | POA: Diagnosis not present

## 2019-08-25 ENCOUNTER — Ambulatory Visit (INDEPENDENT_AMBULATORY_CARE_PROVIDER_SITE_OTHER): Payer: Medicare HMO

## 2019-08-25 ENCOUNTER — Other Ambulatory Visit: Payer: Self-pay

## 2019-08-25 DIAGNOSIS — I359 Nonrheumatic aortic valve disorder, unspecified: Secondary | ICD-10-CM | POA: Diagnosis not present

## 2019-09-03 ENCOUNTER — Telehealth: Payer: Self-pay | Admitting: *Deleted

## 2019-09-03 NOTE — Telephone Encounter (Signed)
-----   Message from Antonieta Iba, MD sent at 08/28/2019  7:42 PM EDT ----- Echocardiogram Aortic stenosis valve stenosis is severe We need to set up a time to discuss the results and plan out a course of action for symptoms before they become Critical

## 2019-09-03 NOTE — Telephone Encounter (Signed)
Left voicemail message to call back for results.  

## 2019-09-04 DIAGNOSIS — B029 Zoster without complications: Secondary | ICD-10-CM

## 2019-09-04 HISTORY — DX: Zoster without complications: B02.9

## 2019-09-04 NOTE — Telephone Encounter (Signed)
Call from patient to review echo results.    Pt verbalized understanding and has no further questions at this time.    Advised pt to call for any further questions or concerns.  No further orders.  Confirmed upcoming appt on 7/12. Discussed sx to look out for and will call if any concerns arise.

## 2019-09-04 NOTE — Telephone Encounter (Signed)
Pt has reviewed results in My Chart. Patient has appointment set up for September 15, 2019.

## 2019-09-15 ENCOUNTER — Other Ambulatory Visit: Payer: Self-pay

## 2019-09-15 ENCOUNTER — Encounter: Payer: Self-pay | Admitting: Family

## 2019-09-15 ENCOUNTER — Ambulatory Visit (INDEPENDENT_AMBULATORY_CARE_PROVIDER_SITE_OTHER): Payer: Medicare HMO | Admitting: Family

## 2019-09-15 VITALS — BP 112/64 | HR 80 | Ht 70.0 in | Wt 214.5 lb

## 2019-09-15 DIAGNOSIS — E1165 Type 2 diabetes mellitus with hyperglycemia: Secondary | ICD-10-CM | POA: Diagnosis not present

## 2019-09-15 DIAGNOSIS — I350 Nonrheumatic aortic (valve) stenosis: Secondary | ICD-10-CM | POA: Diagnosis not present

## 2019-09-15 DIAGNOSIS — I10 Essential (primary) hypertension: Secondary | ICD-10-CM | POA: Diagnosis not present

## 2019-09-15 DIAGNOSIS — E782 Mixed hyperlipidemia: Secondary | ICD-10-CM | POA: Diagnosis not present

## 2019-09-15 DIAGNOSIS — Z794 Long term (current) use of insulin: Secondary | ICD-10-CM

## 2019-09-15 NOTE — Patient Instructions (Addendum)
Medication Instructions:  No medication changes today.   *If you need a refill on your cardiac medications before your next appointment, please call your pharmacy*  Lab Work: Your physician recommends lab work today: BMET, CBC  You will need a COVID test prior to the procedure. Please report to the [facility] medical arts building drive up test site on 03/11/24 between 8am-1pm.  If you have labs (blood work) drawn today and your tests are completely normal, you will receive your results only by: Marland Kitchen MyChart Message (if you have MyChart) OR . A paper copy in the mail If you have any lab test that is abnormal or we need to change your treatment, we will call you to review the results.   Testing/Procedures: [facility] Cardiac Cath Instructions   You are scheduled for a Cardiac Cath on: Friday 09/19/19 with Dr.Gollan  Please arrive at 7:30 am on the day of your procedure  Please expect a call from our [facility] to pre-register you  Do not eat/drink anything after midnight  Someone will need to drive you home  It is recommended someone be with you for the first 24 hours after your procedure  Wear clothes that are easy to get on/off and wear slip on shoes if possible   Medications bring a current list of all medications with you  _X__ Do not take these medications before your procedure:__DO NOT take Metformin the monring of the procedure and for 48 hours after.  Take your insulin 1/2 dose the morning of the procedure. You can take it later in the day after you have eaten.  You may take all of your other  medications the morning of your procedure with enough water to swallow safely    Day of your procedure: Arrive at the [facility] entrance.  Free valet service is available.  After entering the [facility] please check-in at the registration desk (1st desk on your right) to receive your armband. After receiving your armband someone will escort you to the cardiac  cath/special procedures waiting area.  The usual length of stay after your procedure is about 2 to 3 hours.  This can vary.  If you have any questions, please call our office at [phone], or you may call the cardiac cath lab at [facility] directly at [phone]   Follow-Up: At [facility], you and your health needs are our priority.  As part of our continuing mission to provide you with exceptional heart care, we have created designated Provider Care Teams.  These Care Teams include your primary Cardiologist (physician) and Advanced Practice Providers (APPs -  Physician Assistants and Nurse Practitioners) who all work together to provide you with the care you need, when you need it.  We recommend signing up for the patient portal called "MyChart".  Sign up information is provided on this After Visit Summary.  MyChart is used to connect with patients for Virtual Visits (Telemedicine).  Patients are able to view lab/test results, encounter notes, upcoming appointments, etc.  Non-urgent messages can be sent to your provider as well.   To learn more about what you can do with MyChart, go to [email or web address].    Your next appointment:   2 weeks  The format for your next appointment:   In Person  Provider:   You may see Julien Nordmann, MD or one of the following Advanced Practice Providers on your designated Care Team:    Nicolasa Ducking, NP  Eula Listen, PA-C  Marisue Ivan,  PA-C    Other Instructions  Aortic Valve Stenosis  Aortic valve stenosis is a narrowing of the aortic valve in the heart. The aortic valve opens and closes to regulate blood flow between the left side of the heart (left ventricle) and the artery that leads away from the heart (aorta). When the aortic valve becomes narrow, it is difficult for the heart to pump blood out to the body, which causes the heart to work harder. The extra work can weaken the heart muscle over time. Aortic valve stenosis can  range from mild to severe. If it is not treated, it can become more severe over time and lead to heart failure. What are the causes? This condition may be caused by:  Buildup of calcium around and on the aortic valve. This can occur with aging. This is the most common cause of aortic valve stenosis.  A heart problem that developed in the womb (birth defect).  Rheumatic fever.  Radiation to the chest. What increases the risk? You may be more likely to develop this condition if:  You are older than age 32.  You were born with an abnormal bicuspid valve. What are the signs or symptoms? You may not have any symptoms until your condition becomes severe. It may take 10-20 years for mild or moderate aortic valve stenosis to become severe. Symptoms may include:  Shortness of breath. This may get worse during physical activity.  Feeling unusually weak and tired (fatigue).  Extreme discomfort in the chest, neck, or arm during physical activity (angina).  A heartbeat that is irregular or faster than normal (palpitations).  Dizziness or fainting. This may happen when you get physically tired or after you take certain heart medicines, such as nitroglycerin. How is this diagnosed? This condition may be diagnosed with:  A physical exam.  Echocardiogram. This is a type of imaging test that uses sound waves (ultrasound) to make images of your heart. There are two kinds of this test that may be used. ? Transthoracic echocardiogram (TTE). For this type, a wand-like tool (transducer) is moved over your chest to create ultrasound images that are recorded by a computer. ? Transesophageal echocardiogram (TEE). For this type, a flexible tube (probe) is inserted down the part of the body that moves food from your mouth to your stomach (esophagus). The heart and the esophagus are close to each other. Your health care provider will use the probe to take clear, detailed pictures of the heart.  Cardiac  catheterization. For this procedure, a small, thin tube (catheter) is passed through a large vein in your neck, groin, or arm. The catheter is used to get information about arteries, structures, blood pressure, and oxygen levels in your heart.  Stress tests. These are tests that evaluate the blood supply to your heart and your heart's response to exercise. You may work with a health care provider who specializes in the heart (cardiologist) for diagnosis and treatment. How is this treated? Treatment depends on how severe your condition is and what your symptoms are. You will need to have your heart checked regularly to make sure that your condition is not getting worse or causing serious problems. Treatment may also include:  Surgery to replace your aortic valve. This is the most common treatment for aortic valve stenosis, and it is the only treatment to cure the condition. Several types of surgeries are available. The surgery may be done: ? Through a large incision over your heart (open-heart surgery). ? Through small  incisions, using a flexible tube called a catheter (transcatheter aortic valve replacement, TAVR).  Medicines that help to keep your heart rate regular.  Medicines that thin your blood (anticoagulants) to prevent blood clots.  Antibiotic medicines to help prevent infection. If your condition is mild, you may only need regular follow-up visits for monitoring. Follow these instructions at home: Lifestyle  Limit alcohol intake to no more than 1 drink a day for nonpregnant women and 2 drinks a day for men. One drink equals 12 oz of beer, 5 oz of wine, or 1 oz of hard liquor.  Do not use any products that contain nicotine or tobacco, such as cigarettes and e-cigarettes. If you need help quitting, ask your health care provider.  Work with your health care provider to manage your blood pressure and cholesterol.  Maintain a healthy weight. Eating and drinking   Eat a  heart-healthy diet that includes plenty of fresh fruits and vegetables, whole grains, lean protein, and low-fat or nonfat dairy.  Limit how much caffeine you drink. Caffeine can affect your heart's rate and rhythm.  Avoid foods that are: ? High in salt (sodium), saturated fat, or sugar. ? Canned or highly processed. ? Fried.  Follow instructions from your health care provider about any other eating or drinking restrictions. Activity  Exercise regularly and return to your normal activities as told by your health care provider. Ask your health care provider what amount and type of physical activity is safe for you. ? If your aortic valve stenosis is mild, you may only need to avoid very intense physical activity, such as heavy weight lifting. ? The more severe your aortic valve stenosis is, the more activities you may need to avoid. If you are taking blood thinners:  Before you take any medicines that contain aspirin or NSAIDs, talk with your health care provider. These medicines increase your risk for dangerous bleeding.  Take your medicine exactly as told, at the same time every day.  Avoid activities that could cause injury or bruising, and follow instructions about how to prevent falls.  Wear a medical alert bracelet or carry a card that lists what medicines you take. General instructions  Take over-the-counter and prescription medicines only as told by your health care provider.  If you were prescribed an antibiotic, take it as told by your health care provider. Do not stop taking the antibiotic even if you start to feel better.  If you are a woman and you plan to become pregnant, talk with your health care provider before you become pregnant.  Before you have any type of medical or dental procedure or surgery, tell all health care providers that you have aortic valve stenosis. This may affect the treatment that you receive.  Keep all follow-up visits as told by your health care  provider. This is important. Contact a health care provider if:  You have a fever. Get help right away if:  You develop any of the following symptoms: ? Chest pain. ? Chest tightness. ? Shortness of breath. ? Trouble breathing.  You feel light-headed.  You feel like you might faint.  Your heartbeat is irregular or faster than normal. These symptoms may represent a serious problem that is an emergency. Do not wait to see if the symptoms will go away. Get medical help right away. Call your local emergency services (911 in the [place] Do not drive yourself to the hospital. Summary  Aortic valve stenosis is a narrowing of the aortic valve  in the heart. The aortic valve opens and closes to regulate blood flow between the left side of the heart (left ventricle) and the artery that leads away from the heart (aorta).  Aortic valve stenosis can range from mild to severe. If it is not treated, it can become more severe over time and lead to heart failure.  Treatment depends on how severe your condition is and what your symptoms are. You will need to have your heart checked regularly to make sure that your condition is not getting worse or causing serious problems.  Exercise regularly and return to your normal activities as told by your health care provider. Ask your health care provider what amount and type of physical activity is safe for you. This information is not intended to replace advice given to you by your health care provider. Make sure you discuss any questions you have with your health care provider. Document Revised: 02/02/2017 Document Reviewed: 11/23/2016 Elsevier Patient Education  2020 [employer]

## 2019-09-15 NOTE — H&P (View-Only) (Signed)
  Office Visit    Patient Name: Chase Saunders Date of Encounter: 09/15/2019  Primary Care Provider:  Mitchell, L.Dean, MD Primary Cardiologist:  Timothy Gollan, MD Electrophysiologist:  None   Chief Complaint    Chase Saunders is a 68 y.o. male with a hx of severe aortic stenosis, remote tobacco use, DM, obesity, HTN presents today for follow up after echocardiogram   Past Medical History    Past Medical History:  Diagnosis Date  . Asthma    Childhood  . Cataract   . Diabetes mellitus without complication (HCC)   . Heart murmur   . Hyperlipidemia   . Hypertension    Past Surgical History:  Procedure Laterality Date  . TONSILLECTOMY AND ADENOIDECTOMY  1969   Allergies  No Known Allergies  History of Present Illness    Chase Saunders is a 68 y.o. male with a hx of severe aortic stenosis, remote tobacco use, DM, obesity, HTN. He was last seen 06/23/19 by Dr. Gollan.  He works at [employer]. He was previously seen in 2015 with moderate aortic valve stenosis. Echo at that time with AV mean gradient 24mmHg and valve area 1.28 cm^2.  When seen in clinic 06/23/19 he reported lightheadedness, fatigue.   Underwent echocardiogram 08/25/19 showing severe aortic stenosis (AV area 0.84 cm, AV mean gradient 52.5mmHg), EF 60-65%, no RWMA, mild LVH, gr1DD, RV normal size and function   Tells me he continues to be fatigued. We has had difficulty with mowing the grass and was having to stop frequently. He was stopping due to dyspnea, fatigue, and diaphoresis.   Reports episodes of near-syncope a few times per day particularly when bending over to pick something up. Reports no chest pain, pressure, tightness.   We reviewed indication for cardiac catheterization for further workup of aortic stenosis. Long discussion regarding the procedure, risks and benefits, and likely next steps.   EKGs/Labs/Other Studies Reviewed:   The following studies were reviewed today: Echo  08/25/2019  1. Left ventricular ejection fraction, by estimation, is 60 to 65%. The  left ventricle has normal function. The left ventricle has no regional  wall motion abnormalities. There is mild left ventricular hypertrophy.  Left ventricular diastolic parameters  are consistent with Grade I diastolic dysfunction (impaired relaxation).   2. Right ventricular systolic function is normal. The right ventricular  size is normal. There is normal pulmonary artery systolic pressure.   3. The mitral valve is normal in structure. No evidence of mitral valve  regurgitation. No evidence of mitral stenosis.   4. The aortic valve is abnormal. Aortic valve regurgitation is mild.  Severe aortic valve stenosis. Aortic valve area, by VTI measures 0.84 cm.  Aortic valve mean gradient measures 52.5 mmHg.   Comparison(s): Previous Echo showed LV EF 55-60%, no RWMA, grade I  diastolic dysfunction. Moderate AS, severely calcified valves, could not  exclude bicuspid morphology, mean gradient 24 mmHg.   EKG:  EKG is ordered today.  The ekg ordered today demonstrates NSR 80 bpm with no acute ST/T wave changes.   Recent Labs: No results found for requested labs within last 8760 hours.  Recent Lipid Panel    Component Value Date/Time   CHOL 146 01/31/2013 0930   TRIG 115.0 01/31/2013 0930   HDL 43.30 01/31/2013 0930   CHOLHDL 3 01/31/2013 0930   VLDL 23.0 01/31/2013 0930   LDLCALC 80 01/31/2013 0930    Home Medications   Current Meds  Medication Sig  . aspirin   EC 81 MG tablet Take 81 mg by mouth daily.  . Chromium-Cinnamon [phone] MCG-MG CAPS Take by mouth daily.  . citalopram (CELEXA) 20 MG tablet TAKE 1 TABLET BY MOUTH EVERY DAY  . Insulin Detemir (LEVEMIR FLEXTOUCH) 100 UNIT/ML Pen Inject 0.8 mLs (80 Units total) into the skin every morning.  . Insulin Pen Needle 31G X 8 MM MISC Use as directed  . losartan (COZAAR) 50 MG tablet Take 0.5 tablets (25 mg total) by mouth daily.  Marland Kitchen lovastatin  (MEVACOR) 40 MG tablet Take 1 tablet (40 mg total) by mouth at bedtime.  . metFORMIN (GLUCOPHAGE-XR) 500 MG 24 hr tablet Take 500 mg by mouth daily with breakfast.   . Multiple Vitamins-Minerals (MENS MULTIVITAMIN PLUS) TABS Take by mouth daily.      Review of Systems       Review of Systems  Constitutional: Negative for chills, fever and malaise/fatigue.  Cardiovascular: Positive for dyspnea on exertion and near-syncope. Negative for chest pain, leg swelling, orthopnea, palpitations and syncope.  Respiratory: Negative for cough, shortness of breath and wheezing.   Gastrointestinal: Negative for nausea and vomiting.  Neurological: Positive for light-headedness. Negative for dizziness and weakness.   All other systems reviewed and are otherwise negative except as noted above.  Physical Exam    VS:  BP 112/64 (BP Location: Left Arm, Patient Position: Sitting, Cuff Size: Normal)   Pulse 80   Ht 5\' 10"  (1.778 m)   Wt 214 lb 8 oz (97.3 kg)   SpO2 97%   BMI 30.78 kg/m  , BMI Body mass index is 30.78 kg/m. GEN: Well nourished, well developed, in no acute distress. HEENT: normal. Neck: Supple, no JVD, carotid bruits, or masses. Cardiac: RRR, no, rubs, or gallops. Gr 2-3/6 systolic murmur. No clubbing, cyanosis, edema.  Radials/DP/PT 2+ and equal bilaterally.  Respiratory:  Respirations regular and unlabored, clear to auscultation bilaterally. GI: Soft, nontender, nondistended, BS + x 4. MS: No deformity or atrophy. Skin: Warm and dry, no rash. Neuro:  Strength and sensation are intact. Psych: Normal affect.  Assessment & Plan    1. Severe aortic stenosis - Noted by echo 08/2019 with valve area 0.84 cm^2 and mean gradient 52.23mmHg. Symptomatic with dyspnea and near-syncope.   Plan for [facility] for further evaluation of aortic stenosis. The patient understands that risks include but are not limited to stroke (1 in 1000), death (1 in 1000), kidney failure [usually temporary] (1 in 500),  bleeding (1 in 200), allergic reaction [possibly serious] (1 in 200), and agrees to proceed.   Discussed repair modalities of TAVR vs SAVR. Discussed that with his age, likely SAVR will be procedure of choice but await results of cath for referral to cardiothoracic surgery.  We reviewed precautions with near-syncope including adequate hydration, slow position changes.  2. HTN - BP well controlled. Continue Losartan 25mg  daily per PCP.   3. DM2 - 04/2019 A1c of 8.0. Continue to follow with PCP.   4. HLD - 04/2019 lipid panel total cholesterol 133, triglycerides 89, HDL 49, LDL 67. Continue Lovastatin 40mg  daily.   Disposition: Follow up after cardiac catheterization with Dr. 01-31-1981 or APP   05/2019, NP 09/15/2019, 1:52 PM

## 2019-09-15 NOTE — Progress Notes (Signed)
Office Visit    Patient Name: Chase Saunders Date of Encounter: 09/15/2019  Primary Care Provider:  Clovis Riley, L.[name], MD Primary Cardiologist:  Julien Nordmann, MD Electrophysiologist:  None   Chief Complaint    Chase Saunders is a 69 y.o. male with a hx of severe aortic stenosis, remote tobacco use, DM, obesity, HTN presents today for follow up after echocardiogram   Past Medical History    Past Medical History:  Diagnosis Date  . Asthma    Childhood  . Cataract   . Diabetes mellitus without complication (HCC)   . Heart murmur   . Hyperlipidemia   . Hypertension    Past Surgical History:  Procedure Laterality Date  . TONSILLECTOMY AND ADENOIDECTOMY  1969   Allergies  No Known Allergies  History of Present Illness    Chase Saunders is a 69 y.o. male with a hx of severe aortic stenosis, remote tobacco use, DM, obesity, HTN. He was last seen 06/23/19 by Dr. Mariah Milling.  He works at [employer]. He was previously seen in 2015 with moderate aortic valve stenosis. Echo at that time with AV mean gradient and valve area 1.28 cm^2.  When seen in clinic 06/23/19 he reported lightheadedness, fatigue.   Underwent echocardiogram 08/25/19 showing severe aortic stenosis (AV area 0.84 cm, AV mean gradient 52.34mmHg), EF 60-65%, no RWMA, mild LVH, gr1DD, RV normal size and function   Tells me he continues to be fatigued. We has had difficulty with mowing the grass and was having to stop frequently. He was stopping due to dyspnea, fatigue, and diaphoresis.   Reports episodes of near-syncope a few times per day particularly when bending over to pick something up. Reports no chest pain, pressure, tightness.   We reviewed indication for cardiac catheterization for further workup of aortic stenosis. Long discussion regarding the procedure, risks and benefits, and likely next steps.   EKGs/Labs/Other Studies Reviewed:   The following studies were reviewed today: Echo  08/25/2019  1. Left ventricular ejection fraction, by estimation, is 60 to 65%. The  left ventricle has normal function. The left ventricle has no regional  wall motion abnormalities. There is mild left ventricular hypertrophy.  Left ventricular diastolic parameters  are consistent with Grade I diastolic dysfunction (impaired relaxation).   2. Right ventricular systolic function is normal. The right ventricular  size is normal. There is normal pulmonary artery systolic pressure.   3. The mitral valve is normal in structure. No evidence of mitral valve  regurgitation. No evidence of mitral stenosis.   4. The aortic valve is abnormal. Aortic valve regurgitation is mild.  Severe aortic valve stenosis. Aortic valve area, by VTI measures 0.84 cm.  Aortic valve mean gradient measures 52.5 mmHg.   Comparison(s): Previous Echo showed LV EF 55-60%, no RWMA, grade I  diastolic dysfunction. Moderate AS, severely calcified valves, could not  exclude bicuspid morphology, mean gradient 24 mmHg.   EKG:  EKG is ordered today.  The ekg ordered today demonstrates NSR 80 bpm with no acute ST/T wave changes.   Recent Labs: No results found for requested labs within last 8760 hours.  Recent Lipid Panel    Component Value Date/Time   CHOL 146 01/31/2013 0930   TRIG 115.0 01/31/2013 0930   HDL 43.30 01/31/2013 0930   CHOLHDL 3 01/31/2013 0930   VLDL 23.0 01/31/2013 0930   LDLCALC 80 01/31/2013 0930    Home Medications   Current Meds  Medication Sig  . aspirin  EC 81 MG tablet Take 81 mg by mouth daily.  . Chromium-Cinnamon [phone] MCG-MG CAPS Take by mouth daily.  . citalopram (CELEXA) 20 MG tablet TAKE 1 TABLET BY MOUTH EVERY DAY  . Insulin Detemir (LEVEMIR FLEXTOUCH) 100 UNIT/ML Pen Inject 0.8 mLs (80 Units total) into the skin every morning.  . Insulin Pen Needle 31G X 8 MM MISC Use as directed  . losartan (COZAAR) 50 MG tablet Take 0.5 tablets (25 mg total) by mouth daily.  Marland Kitchen lovastatin  (MEVACOR) 40 MG tablet Take 1 tablet (40 mg total) by mouth at bedtime.  . metFORMIN (GLUCOPHAGE-XR) 500 MG 24 hr tablet Take 500 mg by mouth daily with breakfast.   . Multiple Vitamins-Minerals (MENS MULTIVITAMIN PLUS) TABS Take by mouth daily.      Review of Systems       Review of Systems  Constitutional: Negative for chills, fever and malaise/fatigue.  Cardiovascular: Positive for dyspnea on exertion and near-syncope. Negative for chest pain, leg swelling, orthopnea, palpitations and syncope.  Respiratory: Negative for cough, shortness of breath and wheezing.   Gastrointestinal: Negative for nausea and vomiting.  Neurological: Positive for light-headedness. Negative for dizziness and weakness.   All other systems reviewed and are otherwise negative except as noted above.  Physical Exam    VS:  BP 112/64 (BP Location: Left Arm, Patient Position: Sitting, Cuff Size: Normal)   Pulse 80   Ht 5\' 10"  (1.778 m)   Wt 214 lb 8 oz (97.3 kg)   SpO2 97%   BMI 30.78 kg/m  , BMI Body mass index is 30.78 kg/m. GEN: Well nourished, well developed, in no acute distress. HEENT: normal. Neck: Supple, no JVD, carotid bruits, or masses. Cardiac: RRR, no, rubs, or gallops. Gr 2-3/6 systolic murmur. No clubbing, cyanosis, edema.  Radials/DP/PT 2+ and equal bilaterally.  Respiratory:  Respirations regular and unlabored, clear to auscultation bilaterally. GI: Soft, nontender, nondistended, BS + x 4. MS: No deformity or atrophy. Skin: Warm and dry, no rash. Neuro:  Strength and sensation are intact. Psych: Normal affect.  Assessment & Plan    1. Severe aortic stenosis - Noted by echo 08/2019 with valve area 0.84 cm^2 and mean gradient 52.23mmHg. Symptomatic with dyspnea and near-syncope.   Plan for [facility] for further evaluation of aortic stenosis. The patient understands that risks include but are not limited to stroke (1 in 1000), death (1 in 1000), kidney failure [usually temporary] (1 in 500),  bleeding (1 in 200), allergic reaction [possibly serious] (1 in 200), and agrees to proceed.   Discussed repair modalities of TAVR vs SAVR. Discussed that with his age, likely SAVR will be procedure of choice but await results of cath for referral to cardiothoracic surgery.  We reviewed precautions with near-syncope including adequate hydration, slow position changes.  2. HTN - BP well controlled. Continue Losartan 25mg  daily per PCP.   3. DM2 - 04/2019 A1c of 8.0. Continue to follow with PCP.   4. HLD - 04/2019 lipid panel total cholesterol 133, triglycerides 89, HDL 49, LDL 67. Continue Lovastatin 40mg  daily.   Disposition: Follow up after cardiac catheterization with Dr. 01-31-1981 or APP   05/2019, NP 09/15/2019, 1:52 PM

## 2019-09-16 LAB — CBC WITH DIFFERENTIAL/PLATELET
Basophils Absolute: 0.1 10*3/uL (ref 0.0–0.2)
Basos: 1 %
EOS (ABSOLUTE): 0.4 10*3/uL (ref 0.0–0.4)
Eos: 6 %
Hematocrit: 40.3 % (ref 37.5–51.0)
Hemoglobin: 13.8 g/dL (ref 13.0–17.7)
Immature Grans (Abs): 0 10*3/uL (ref 0.0–0.1)
Immature Granulocytes: 0 %
Lymphocytes Absolute: 2.4 10*3/uL (ref 0.7–3.1)
Lymphs: 35 %
MCH: 33.5 pg — ABNORMAL HIGH (ref 26.6–33.0)
MCHC: 34.2 g/dL (ref 31.5–35.7)
MCV: 98 fL — ABNORMAL HIGH (ref 79–97)
Monocytes Absolute: 0.9 10*3/uL (ref 0.1–0.9)
Monocytes: 13 %
Neutrophils Absolute: 3.1 10*3/uL (ref 1.4–7.0)
Neutrophils: 45 %
Platelets: 251 10*3/uL (ref 150–450)
RBC: 4.12 x10E6/uL — ABNORMAL LOW (ref 4.14–5.80)
RDW: 12.6 % (ref 11.6–15.4)
WBC: 6.8 10*3/uL (ref 3.4–10.8)

## 2019-09-16 LAB — BASIC METABOLIC PANEL
BUN/Creatinine Ratio: 11 (ref 10–24)
BUN: 10 mg/dL (ref 8–27)
CO2: 26 mmol/L (ref 20–29)
Calcium: 9 mg/dL (ref 8.6–10.2)
Chloride: 101 mmol/L (ref 96–106)
Creatinine, Ser: 0.95 mg/dL (ref 0.76–1.27)
GFR calc Af Amer: 94 mL/min/{1.73_m2} (ref >59)
GFR calc non Af Amer: 81 mL/min/{1.73_m2} (ref >59)
Glucose: 210 mg/dL — ABNORMAL HIGH (ref 65–99)
Potassium: 4.6 mmol/L (ref 3.5–5.2)
Sodium: 139 mmol/L (ref 134–144)

## 2019-09-17 ENCOUNTER — Other Ambulatory Visit
Admission: RE | Admit: 2019-09-17 | Discharge: 2019-09-17 | Disposition: A | Discharge status: Home / Self Care | Payer: Medicare HMO | Source: Ambulatory Visit | Attending: Family | Admitting: Family

## 2019-09-17 ENCOUNTER — Other Ambulatory Visit: Payer: Self-pay

## 2019-09-17 DIAGNOSIS — Z20822 Contact with and (suspected) exposure to covid-19: Secondary | ICD-10-CM | POA: Diagnosis not present

## 2019-09-17 DIAGNOSIS — Z01812 Encounter for preprocedural laboratory examination: Secondary | ICD-10-CM | POA: Insufficient documentation

## 2019-09-17 LAB — SARS CORONAVIRUS 2 (TAT 6-24 HRS): SARS Coronavirus 2: NEGATIVE

## 2019-09-19 ENCOUNTER — Encounter
Admission: RE | Disposition: A | Discharge status: Home / Self Care | Payer: Self-pay | Source: Home / Self Care | Attending: Cardiovascular Disease

## 2019-09-19 ENCOUNTER — Telehealth: Payer: Self-pay | Admitting: *Deleted

## 2019-09-19 ENCOUNTER — Other Ambulatory Visit: Payer: Self-pay

## 2019-09-19 ENCOUNTER — Encounter: Payer: Self-pay | Admitting: Cardiovascular Disease

## 2019-09-19 ENCOUNTER — Other Ambulatory Visit: Payer: Self-pay | Admitting: *Deleted

## 2019-09-19 ENCOUNTER — Other Ambulatory Visit: Payer: Self-pay | Admitting: Cardiovascular Disease

## 2019-09-19 ENCOUNTER — Ambulatory Visit
Admission: RE | Admit: 2019-09-19 | Discharge: 2019-09-19 | Disposition: A | Discharge status: Home / Self Care | Payer: Medicare HMO | Attending: Cardiovascular Disease | Admitting: Cardiovascular Disease

## 2019-09-19 DIAGNOSIS — E1136 Type 2 diabetes mellitus with diabetic cataract: Secondary | ICD-10-CM | POA: Insufficient documentation

## 2019-09-19 DIAGNOSIS — Z6830 Body mass index (BMI) 30.0-30.9, adult: Secondary | ICD-10-CM | POA: Insufficient documentation

## 2019-09-19 DIAGNOSIS — I25110 Atherosclerotic heart disease of native coronary artery with unstable angina pectoris: Secondary | ICD-10-CM | POA: Diagnosis not present

## 2019-09-19 DIAGNOSIS — Z79899 Other long term (current) drug therapy: Secondary | ICD-10-CM | POA: Insufficient documentation

## 2019-09-19 DIAGNOSIS — Z7982 Long term (current) use of aspirin: Secondary | ICD-10-CM | POA: Diagnosis not present

## 2019-09-19 DIAGNOSIS — I350 Nonrheumatic aortic (valve) stenosis: Secondary | ICD-10-CM | POA: Insufficient documentation

## 2019-09-19 DIAGNOSIS — I2510 Atherosclerotic heart disease of native coronary artery without angina pectoris: Secondary | ICD-10-CM

## 2019-09-19 DIAGNOSIS — E785 Hyperlipidemia, unspecified: Secondary | ICD-10-CM | POA: Insufficient documentation

## 2019-09-19 DIAGNOSIS — Z794 Long term (current) use of insulin: Secondary | ICD-10-CM | POA: Insufficient documentation

## 2019-09-19 DIAGNOSIS — I10 Essential (primary) hypertension: Secondary | ICD-10-CM | POA: Diagnosis not present

## 2019-09-19 DIAGNOSIS — E1165 Type 2 diabetes mellitus with hyperglycemia: Secondary | ICD-10-CM

## 2019-09-19 DIAGNOSIS — R0602 Shortness of breath: Secondary | ICD-10-CM

## 2019-09-19 DIAGNOSIS — I200 Unstable angina: Secondary | ICD-10-CM

## 2019-09-19 HISTORY — PX: RIGHT/LEFT HEART CATH AND CORONARY ANGIOGRAPHY: CATH118266

## 2019-09-19 LAB — GLUCOSE, CAPILLARY: Glucose-Capillary: 138 mg/dL — ABNORMAL HIGH (ref 70–99)

## 2019-09-19 SURGERY — RIGHT/LEFT HEART CATH AND CORONARY ANGIOGRAPHY
Anesthesia: Moderate Sedation | Laterality: Bilateral

## 2019-09-19 MED ORDER — LIDOCAINE HCL (PF) 1 % IJ SOLN
INTRAMUSCULAR | Status: AC
  Filled 2019-09-19: qty 30

## 2019-09-19 MED ORDER — NITROGLYCERIN 0.4 MG SL SUBL
0.4000 mg | SUBLINGUAL_TABLET | SUBLINGUAL | 3 refills | Status: DC | PRN
Start: 2019-09-19 — End: 2023-03-20

## 2019-09-19 MED ORDER — HYDRALAZINE HCL 20 MG/ML IJ SOLN: 10.0000 mg | INTRAMUSCULAR | Status: DC | PRN

## 2019-09-19 MED ORDER — FENTANYL CITRATE (PF) 100 MCG/2ML IJ SOLN
INTRAMUSCULAR | Status: DC | PRN
  Administered 2019-09-19: 25 ug via INTRAVENOUS

## 2019-09-19 MED ORDER — SODIUM CHLORIDE 0.9% FLUSH: 3.0000 mL | INTRAVENOUS | Status: DC | PRN

## 2019-09-19 MED ORDER — LABETALOL HCL 5 MG/ML IV SOLN: 10.0000 mg | INTRAVENOUS | Status: DC | PRN

## 2019-09-19 MED ORDER — HEPARIN (PORCINE) IN NACL 1000-0.9 UT/500ML-% IV SOLN
INTRAVENOUS | Status: DC | PRN
  Administered 2019-09-19: 500 mL

## 2019-09-19 MED ORDER — SODIUM CHLORIDE 0.9 % IV SOLN: INTRAVENOUS | Status: DC

## 2019-09-19 MED ORDER — ONDANSETRON HCL 4 MG/2ML IJ SOLN: 4.0000 mg | Freq: Four times a day (QID) | INTRAMUSCULAR | Status: DC | PRN

## 2019-09-19 MED ORDER — ASPIRIN 81 MG PO CHEW
CHEWABLE_TABLET | ORAL | Status: AC
  Filled 2019-09-19: qty 1

## 2019-09-19 MED ORDER — SODIUM CHLORIDE 0.9 % IV SOLN: 250.0000 mL | INTRAVENOUS | Status: DC | PRN

## 2019-09-19 MED ORDER — ACETAMINOPHEN 325 MG PO TABS: 650.0000 mg | ORAL_TABLET | ORAL | Status: DC | PRN

## 2019-09-19 MED ORDER — MIDAZOLAM HCL 2 MG/2ML IJ SOLN
INTRAMUSCULAR | Status: DC | PRN
  Administered 2019-09-19: 1 mg via INTRAVENOUS

## 2019-09-19 MED ORDER — SODIUM CHLORIDE 0.9% FLUSH: 3.0000 mL | Freq: Two times a day (BID) | INTRAVENOUS | Status: DC

## 2019-09-19 MED ORDER — MIDAZOLAM HCL 2 MG/2ML IJ SOLN
INTRAMUSCULAR | Status: AC
  Filled 2019-09-19: qty 2

## 2019-09-19 MED ORDER — IOHEXOL 300 MG/ML  SOLN
INTRAMUSCULAR | Status: DC | PRN
  Administered 2019-09-19: 130 mL

## 2019-09-19 MED ORDER — LIDOCAINE HCL (PF) 1 % IJ SOLN
INTRAMUSCULAR | Status: DC | PRN
  Administered 2019-09-19: 30 mL

## 2019-09-19 MED ORDER — FENTANYL CITRATE (PF) 100 MCG/2ML IJ SOLN
INTRAMUSCULAR | Status: AC
  Filled 2019-09-19: qty 2

## 2019-09-19 MED ORDER — SODIUM CHLORIDE 0.9 % WEIGHT BASED INFUSION: 1.0000 mL/kg/h | INTRAVENOUS | Status: DC

## 2019-09-19 MED ORDER — HEPARIN (PORCINE) IN NACL 1000-0.9 UT/500ML-% IV SOLN
INTRAVENOUS | Status: AC
  Filled 2019-09-19: qty 1000

## 2019-09-19 MED ORDER — ASPIRIN 81 MG PO CHEW
81.0000 mg | CHEWABLE_TABLET | ORAL | Status: AC
  Administered 2019-09-19: 81 mg via ORAL

## 2019-09-19 SURGICAL SUPPLY — 14 items
CANNULA 5F STIFF (CANNULA) ×3 IMPLANT
CATH INFINITI 5FR AL1 (CATHETERS) ×3 IMPLANT
CATH INFINITI 5FR ANG PIGTAIL (CATHETERS) ×3 IMPLANT
CATH INFINITI 5FR JL4 (CATHETERS) ×3 IMPLANT
CATH INFINITI JR4 5F (CATHETERS) ×3 IMPLANT
CATH SWANZ 7F THERMO (CATHETERS) ×3 IMPLANT
DEVICE CLOSURE MYNXGRIP 5F (Vascular Products) ×3 IMPLANT
KIT MANI 3VAL PERCEP (MISCELLANEOUS) ×3 IMPLANT
NEEDLE PERC 18GX7CM (NEEDLE) ×3 IMPLANT
PACK CARDIAC CATH (CUSTOM PROCEDURE TRAY) ×3 IMPLANT
SHEATH AVANTI 5FR X 11CM (SHEATH) ×3 IMPLANT
SHEATH AVANTI 7FRX11 (SHEATH) ×3 IMPLANT
WIRE EMERALD ST .035X150CM (WIRE) ×3 IMPLANT
WIRE GUIDERIGHT .035X150 (WIRE) ×3 IMPLANT

## 2019-09-19 NOTE — Telephone Encounter (Signed)
Message from Dr Mariah Milling received via secure chat: Chase Saunders, is pam in today, who is covering office that could help me place a referral on patient for CT surgery for aortic valve replacement (aortic valve stenosis) and CAD unstbale angina, needs LIMA to LAD, worsening sx, woul prefer expedited appt please thx   Referral for TCTS placed.

## 2019-09-19 NOTE — Telephone Encounter (Signed)
Scheduled for 09/22/19 with Dr Cornelius Moras.

## 2019-09-19 NOTE — Discharge Instructions (Signed)
Moderate Conscious Sedation, Adult, Care After °These instructions provide you with information about caring for yourself after your procedure. Your health care provider may also give you more specific instructions. Your treatment has been planned according to current medical practices, but problems sometimes occur. Call your health care provider if you have any problems or questions after your procedure. °What can I expect after the procedure? °After your procedure, it is common: °· To feel sleepy for several hours. °· To feel clumsy and have poor balance for several hours. °· To have poor judgment for several hours. °· To vomit if you eat too soon. °Follow these instructions at home: °For at least 24 hours after the procedure: ° °· Do not: °? Participate in activities where you could fall or become injured. °? Drive. °? Use heavy machinery. °? Drink alcohol. °? Take sleeping pills or medicines that cause drowsiness. °? Make important decisions or sign legal documents. °? Take care of children on your own. °· Rest. °Eating and drinking °· Follow the diet recommended by your health care provider. °· If you vomit: °? Drink water, juice, or soup when you can drink without vomiting. °? Make sure you have little or no nausea before eating solid foods. °General instructions °· Have a responsible adult stay with you until you are awake and alert. °· Take over-the-counter and prescription medicines only as told by your health care provider. °· If you smoke, do not smoke without supervision. °· Keep all follow-up visits as told by your health care provider. This is important. °Contact a health care provider if: °· You keep feeling nauseous or you keep vomiting. °· You feel light-headed. °· You develop a rash. °· You have a fever. °Get help right away if: °· You have trouble breathing. °This information is not intended to replace advice given to you by your health care provider. Make sure you discuss any questions you have  with your health care provider. °Document Revised: 02/02/2017 Document Reviewed: 06/12/2015 °Elsevier Patient Education © 2020 [employer] °Femoral Site Care °This sheet gives you information about how to care for yourself after your procedure. Your health care provider may also give you more specific instructions. If you have problems or questions, contact your health care provider. °What can I expect after the procedure? °After the procedure, it is common to have: °· Bruising that usually fades within 1-2 weeks. °· Tenderness at the site. °Follow these instructions at home: °Wound care °· Follow instructions from your health care provider about how to take care of your insertion site. Make sure you: °? Wash your hands with soap and water before you change your bandage (dressing). If soap and water are not available, use hand sanitizer. °? Change your dressing as told by your health care provider. °? Leave stitches (sutures), skin glue, or adhesive strips in place. These skin closures may need to stay in place for 2 weeks or longer. If adhesive strip edges start to loosen and curl up, you may trim the loose edges. Do not remove adhesive strips completely unless your health care provider tells you to do that. °· Do not take baths, swim, or use a hot tub until your health care provider approves. °· You may shower 24-48 hours after the procedure or as told by your health care provider. °? Gently wash the site with plain soap and water. °? Pat the area dry with a clean towel. °? Do not rub the site. This may cause bleeding. °· Do not   apply powder or lotion to the site. Keep the site clean and dry. °· Check your femoral site every day for signs of infection. Check for: °? Redness, swelling, or pain. °? Fluid or blood. °? Warmth. °? Pus or a bad smell. °Activity °· For the first 2-3 days after your procedure, or as long as directed: °? Avoid climbing stairs as much as possible. °? Do not squat. °· Do not lift anything  that is heavier than 10 lb (4.5 kg), or the limit that you are told, until your health care provider says that it is safe. °· Rest as directed. °? Avoid sitting for a long time without moving. Get up to take short walks every 1-2 hours. °· Do not drive for 24 hours if you were given a medicine to help you relax (sedative). °General instructions °· Take over-the-counter and prescription medicines only as told by your health care provider. °· Keep all follow-up visits as told by your health care provider. This is important. °Contact a health care provider if you have: °· A fever or chills. °· You have redness, swelling, or pain around your insertion site. °Get help right away if: °· The catheter insertion area swells very fast. °· You pass out. °· You suddenly start to sweat or your skin gets clammy. °· The catheter insertion area is bleeding, and the bleeding does not stop when you hold steady pressure on the area. °· The area near or just beyond the catheter insertion site becomes pale, cool, tingly, or numb. °These symptoms may represent a serious problem that is an emergency. Do not wait to see if the symptoms will go away. Get medical help right away. Call your local emergency services (911 in the [place] Do not drive yourself to the hospital. °Summary °· After the procedure, it is common to have bruising that usually fades within 1-2 weeks. °· Check your femoral site every day for signs of infection. °· Do not lift anything that is heavier than 10 lb (4.5 kg), or the limit that you are told, until your health care provider says that it is safe. °This information is not intended to replace advice given to you by your health care provider. Make sure you discuss any questions you have with your health care provider. °Document Revised: 03/05/2017 Document Reviewed: 03/05/2017 °Elsevier Patient Education © 2020 [employer] °Coronary Angiogram °A coronary angiogram is an X-ray procedure that is used to examine the  arteries in the heart. Contrast dye is injected through a long, thin tube (catheter) into these arteries. Then X-rays are taken to show any blockage in these arteries. °You may have this procedure if you: °· Are having chest pain, or other symptoms of angina, and you are at risk for heart disease. °· Have an abnormal stress test or test of your heart's electrical activity (electrocardiogram, or ECG). °· Have chest pain and heart failure. °· Are having irregular heart rhythms. °A coronary angiogram or heart catheterization can show if you have valve disease or a disease of the aorta. This procedure can also be used to check the overall function of your heart muscle. °Let your health care provider know about: °· Any allergies you have, including allergies to medicines or contrast dye. °· All medicines you are taking, including vitamins, herbs, eye drops, creams, and over-the-counter medicines. °· Any problems you or family members have had with anesthetic medicines. °· Any blood disorders you have. °· Any surgeries you have had. °· Any history of kidney   problems or kidney failure. °· Any medical conditions you have. °· Whether you are pregnant or may be pregnant. °· Whether you are breastfeeding. °What are the risks? °Generally, this is a safe procedure. However, problems may occur, including: °· Infection. °· Allergic reaction to medicines or dyes that are used. °· Bleeding from the insertion site or other places. °· Damage to nearby structures, such as blood vessels, or damage to kidneys from contrast dye. °· Irregular heart rhythms. °· Stroke (rare). °· Heart attack (rare). °What happens before the procedure? °Staying hydrated °Follow instructions from your health care provider about hydration, which may include: °· Up to 2 hours before the procedure - you may continue to drink clear liquids, such as water, clear fruit juice, black coffee, and plain tea. ° °Eating and drinking restrictions °Follow instructions from  your health care provider about eating and drinking, which may include: °· 8 hours before the procedure - stop eating heavy meals or foods, such as meat, fried foods, or fatty foods. °· 6 hours before the procedure - stop eating light meals or foods, such as toast or cereal. °· 6 hours before the procedure - stop drinking milk or drinks that contain milk. °· 2 hours before the procedure - stop drinking clear liquids. °Medicines °Ask your health care provider about: °· Changing or stopping your regular medicines. This is especially important if you are taking diabetes medicines or blood thinners. °· Taking medicines such as aspirin and ibuprofen. These medicines can thin your blood. Do not take these medicines unless your health care provider tells you to take them. Aspirin may be recommended before coronary angiograms even if you do not normally take it. °· Taking over-the-counter medicines, vitamins, herbs, and supplements. °General instructions °· Do not use any products that contain nicotine or tobacco for at least 4 weeks before the procedure. These products include cigarettes, e-cigarettes, and chewing tobacco. If you need help quitting, ask your health care provider. °· You may have an exam or testing. °· Plan to have someone take you home from the hospital or clinic. °· If you will be going home right after the procedure, plan to have someone with you for 24 hours. °· Ask your health care provider: °? How your insertion site will be marked. °? What steps will be taken to help prevent infection. These may include: °§ Removing hair at the insertion site. °§ Washing skin with a germ-killing soap. °§ Taking antibiotic medicine. °What happens during the procedure? ° °· You will lie on your back on an X-ray table. °· An IV will be inserted into one of your veins. °· Electrodes will be placed on your chest. °· You will be given one or more of the following: °? A medicine to help you relax (sedative). °? A medicine  to numb the catheter insertion area (local anesthetic). °· You will be connected to a continuous ECG monitor. °· The catheter will be inserted into an artery in one of these areas: °? Your groin area in your upper thigh. °? Your wrist. °? The fold of your arm, near your elbow. °· An X-ray procedure (fluoroscopy) will be used to help guide the catheter to the opening of the blood vessel to be used. °· A dye will be injected into the catheter and X-rays will be taken. The dye will help to show any narrowing or blockages in the heart arteries. °· Tell your health care provider if you have chest pain or trouble breathing. °· If   blockages are found, another procedure may be done to open the artery. °· The catheter will be removed after the fluoroscopy is complete. °· A bandage (dressing) will be placed over the insertion site. Pressure will be applied to stop bleeding. °· The IV will be removed. °The procedure may vary among health care providers and hospitals. °What happens after the procedure? °· Your blood pressure, heart rate, breathing rate, and blood oxygen level will be monitored until you leave the hospital or clinic. °· You will need to lie still for a few hours, or for as long as told by your health care provider. °? If the procedure is done through the groin, you will be told not to bend or cross your legs. °· The insertion site and the pulse in your foot or wrist will be checked often. °· More blood tests, X-rays, and an ECG may be done. °· Do not drive for 24 hours if you were given a sedative during your procedure. °Summary °· A coronary angiogram is an X-ray procedure that is used to examine the arteries in the heart. °· Contrast dye is injected through a long, thin tube (catheter) into each artery. °· Tell your health care provider about any allergies you have, including allergies to contrast dye. °· After the procedure, you will need to lie still for a few hours and drink plenty of fluids. °This  information is not intended to replace advice given to you by your health care provider. Make sure you discuss any questions you have with your health care provider. °Document Revised: 09/12/2018 Document Reviewed: 09/12/2018 °Elsevier Patient Education © 2020 [employer] Inc. ° °

## 2019-09-21 NOTE — H&P (Signed)
H&P Addendum, precardiac catheterization  Patient was seen and evaluated prior to Cardiac catheterization procedure Symptoms, prior testing details again confirmed with the patient Patient examined, no significant change from prior exam Lab work reviewed in detail personally by myself Patient understands risk and benefit of the procedure, willing to proceed  Signed, Tim Atley Scarboro, MD, Ph.D [facility]    

## 2019-09-22 ENCOUNTER — Encounter (HOSPITAL_COMMUNITY)
Admission: RE | Admit: 2019-09-22 | Discharge: 2019-09-22 | Disposition: A | Discharge status: Home / Self Care | Payer: Medicare HMO | Source: Ambulatory Visit | Attending: Thoracic Surgery (Cardiothoracic Vascular Surgery) | Admitting: Thoracic Surgery (Cardiothoracic Vascular Surgery)

## 2019-09-22 ENCOUNTER — Institutional Professional Consult (permissible substitution): Payer: Medicare HMO | Admitting: Thoracic Surgery (Cardiothoracic Vascular Surgery)

## 2019-09-22 ENCOUNTER — Other Ambulatory Visit (HOSPITAL_COMMUNITY)
Admission: RE | Admit: 2019-09-22 | Discharge: 2019-09-22 | Disposition: A | Discharge status: Home / Self Care | Payer: Medicare HMO | Source: Ambulatory Visit | Attending: Thoracic Surgery (Cardiothoracic Vascular Surgery) | Admitting: Thoracic Surgery (Cardiothoracic Vascular Surgery)

## 2019-09-22 ENCOUNTER — Encounter: Payer: Self-pay | Admitting: Thoracic Surgery (Cardiothoracic Vascular Surgery)

## 2019-09-22 ENCOUNTER — Ambulatory Visit (HOSPITAL_COMMUNITY)
Admission: RE | Admit: 2019-09-22 | Discharge: 2019-09-22 | Disposition: A | Discharge status: Home / Self Care | Payer: Medicare HMO | Source: Ambulatory Visit | Attending: Thoracic Surgery (Cardiothoracic Vascular Surgery) | Admitting: Thoracic Surgery (Cardiothoracic Vascular Surgery)

## 2019-09-22 ENCOUNTER — Other Ambulatory Visit: Payer: Self-pay

## 2019-09-22 ENCOUNTER — Encounter (HOSPITAL_COMMUNITY): Payer: Self-pay

## 2019-09-22 ENCOUNTER — Other Ambulatory Visit: Payer: Self-pay | Admitting: Thoracic Surgery (Cardiothoracic Vascular Surgery)

## 2019-09-22 VITALS — BP 142/77 | HR 91 | Temp 97.7°F | Resp 18 | Ht 70.0 in | Wt 214.0 lb

## 2019-09-22 DIAGNOSIS — I110 Hypertensive heart disease with heart failure: Secondary | ICD-10-CM | POA: Diagnosis present

## 2019-09-22 DIAGNOSIS — Z01812 Encounter for preprocedural laboratory examination: Secondary | ICD-10-CM | POA: Insufficient documentation

## 2019-09-22 DIAGNOSIS — Z20822 Contact with and (suspected) exposure to covid-19: Secondary | ICD-10-CM | POA: Insufficient documentation

## 2019-09-22 DIAGNOSIS — Z794 Long term (current) use of insulin: Secondary | ICD-10-CM | POA: Diagnosis not present

## 2019-09-22 DIAGNOSIS — Z01818 Encounter for other preprocedural examination: Secondary | ICD-10-CM | POA: Diagnosis not present

## 2019-09-22 DIAGNOSIS — I350 Nonrheumatic aortic (valve) stenosis: Secondary | ICD-10-CM

## 2019-09-22 DIAGNOSIS — I517 Cardiomegaly: Secondary | ICD-10-CM | POA: Diagnosis not present

## 2019-09-22 DIAGNOSIS — R918 Other nonspecific abnormal finding of lung field: Secondary | ICD-10-CM | POA: Diagnosis not present

## 2019-09-22 DIAGNOSIS — Z6830 Body mass index (BMI) 30.0-30.9, adult: Secondary | ICD-10-CM | POA: Diagnosis not present

## 2019-09-22 DIAGNOSIS — I2510 Atherosclerotic heart disease of native coronary artery without angina pectoris: Secondary | ICD-10-CM

## 2019-09-22 DIAGNOSIS — I200 Unstable angina: Secondary | ICD-10-CM | POA: Diagnosis not present

## 2019-09-22 DIAGNOSIS — E782 Mixed hyperlipidemia: Secondary | ICD-10-CM | POA: Diagnosis present

## 2019-09-22 DIAGNOSIS — I083 Combined rheumatic disorders of mitral, aortic and tricuspid valves: Secondary | ICD-10-CM | POA: Diagnosis not present

## 2019-09-22 DIAGNOSIS — E119 Type 2 diabetes mellitus without complications: Secondary | ICD-10-CM | POA: Diagnosis present

## 2019-09-22 DIAGNOSIS — Z8249 Family history of ischemic heart disease and other diseases of the circulatory system: Secondary | ICD-10-CM | POA: Diagnosis not present

## 2019-09-22 DIAGNOSIS — Z833 Family history of diabetes mellitus: Secondary | ICD-10-CM | POA: Diagnosis not present

## 2019-09-22 DIAGNOSIS — Z79899 Other long term (current) drug therapy: Secondary | ICD-10-CM | POA: Diagnosis not present

## 2019-09-22 DIAGNOSIS — I358 Other nonrheumatic aortic valve disorders: Secondary | ICD-10-CM | POA: Diagnosis not present

## 2019-09-22 DIAGNOSIS — R079 Chest pain, unspecified: Secondary | ICD-10-CM | POA: Diagnosis not present

## 2019-09-22 DIAGNOSIS — Z87891 Personal history of nicotine dependence: Secondary | ICD-10-CM | POA: Diagnosis not present

## 2019-09-22 DIAGNOSIS — Z7982 Long term (current) use of aspirin: Secondary | ICD-10-CM | POA: Diagnosis not present

## 2019-09-22 DIAGNOSIS — I5032 Chronic diastolic (congestive) heart failure: Secondary | ICD-10-CM | POA: Diagnosis present

## 2019-09-22 DIAGNOSIS — J90 Pleural effusion, not elsewhere classified: Secondary | ICD-10-CM | POA: Diagnosis not present

## 2019-09-22 DIAGNOSIS — I25110 Atherosclerotic heart disease of native coronary artery with unstable angina pectoris: Secondary | ICD-10-CM

## 2019-09-22 DIAGNOSIS — J9811 Atelectasis: Secondary | ICD-10-CM | POA: Diagnosis not present

## 2019-09-22 DIAGNOSIS — D62 Acute posthemorrhagic anemia: Secondary | ICD-10-CM | POA: Diagnosis not present

## 2019-09-22 DIAGNOSIS — I10 Essential (primary) hypertension: Secondary | ICD-10-CM | POA: Diagnosis not present

## 2019-09-22 DIAGNOSIS — E669 Obesity, unspecified: Secondary | ICD-10-CM | POA: Diagnosis present

## 2019-09-22 HISTORY — DX: Unspecified osteoarthritis, unspecified site: M19.90

## 2019-09-22 LAB — COMPREHENSIVE METABOLIC PANEL
ALT: 33 U/L (ref 0–44)
AST: 30 U/L (ref 15–41)
Albumin: 3.9 g/dL (ref 3.5–5.0)
Alkaline Phosphatase: 82 U/L (ref 38–126)
Anion gap: 10 (ref 5–15)
BUN: 13 mg/dL (ref 8–23)
CO2: 25 mmol/L (ref 22–32)
Calcium: 9.2 mg/dL (ref 8.9–10.3)
Chloride: 102 mmol/L (ref 98–111)
Creatinine, Ser: 0.96 mg/dL (ref 0.61–1.24)
GFR calc Af Amer: >60 mL/min (ref >60)
GFR calc non Af Amer: >60 mL/min (ref >60)
Glucose, Bld: 188 mg/dL — ABNORMAL HIGH (ref 70–99)
Potassium: 4.2 mmol/L (ref 3.5–5.1)
Sodium: 137 mmol/L (ref 135–145)
Total Bilirubin: 0.7 mg/dL (ref 0.3–1.2)
Total Protein: 6.2 g/dL — ABNORMAL LOW (ref 6.5–8.1)

## 2019-09-22 LAB — BLOOD GAS, ARTERIAL
Acid-Base Excess: 3.1 mmol/L — ABNORMAL HIGH (ref 0.0–2.0)
Bicarbonate: 26.9 mmol/L (ref 20.0–28.0)
Drawn by: 421801
FIO2: 21
O2 Saturation: 97.7 %
Patient temperature: 37
pCO2 arterial: 39.7 mmHg (ref 32.0–48.0)
pH, Arterial: 7.446 (ref 7.350–7.450)
pO2, Arterial: 101 mmHg (ref 83.0–108.0)

## 2019-09-22 LAB — CBC
HCT: 40.8 % (ref 39.0–52.0)
Hemoglobin: 14.1 g/dL (ref 13.0–17.0)
MCH: 33.7 pg (ref 26.0–34.0)
MCHC: 34.6 g/dL (ref 30.0–36.0)
MCV: 97.4 fL (ref 80.0–100.0)
Platelets: 249 10*3/uL (ref 150–400)
RBC: 4.19 MIL/uL — ABNORMAL LOW (ref 4.22–5.81)
RDW: 12.7 % (ref 11.5–15.5)
WBC: 8.4 10*3/uL (ref 4.0–10.5)
nRBC: 0 % (ref 0.0–0.2)

## 2019-09-22 LAB — SARS CORONAVIRUS 2 (TAT 6-24 HRS): SARS Coronavirus 2: NEGATIVE

## 2019-09-22 LAB — TYPE AND SCREEN
ABO/RH(D): A POS
Antibody Screen: NEGATIVE

## 2019-09-22 LAB — URINALYSIS, ROUTINE W REFLEX MICROSCOPIC
Bilirubin Urine: NEGATIVE
Glucose, UA: NEGATIVE mg/dL
Hgb urine dipstick: NEGATIVE
Ketones, ur: NEGATIVE mg/dL
Leukocytes,Ua: NEGATIVE
Nitrite: NEGATIVE
Protein, ur: NEGATIVE mg/dL
Specific Gravity, Urine: 1.019 (ref 1.005–1.030)
pH: 6 (ref 5.0–8.0)

## 2019-09-22 LAB — HEMOGLOBIN A1C
Hgb A1c MFr Bld: 8.2 % — ABNORMAL HIGH (ref 4.8–5.6)
Mean Plasma Glucose: 188.64 mg/dL

## 2019-09-22 LAB — APTT: aPTT: 29 seconds (ref 24–36)

## 2019-09-22 LAB — GLUCOSE, CAPILLARY: Glucose-Capillary: 188 mg/dL — ABNORMAL HIGH (ref 70–99)

## 2019-09-22 LAB — PROTIME-INR
INR: 1.1 (ref 0.8–1.2)
Prothrombin Time: 13.9 seconds (ref 11.4–15.2)

## 2019-09-22 LAB — SURGICAL PCR SCREEN
MRSA, PCR: NEGATIVE
Staphylococcus aureus: NEGATIVE

## 2019-09-22 NOTE — Progress Notes (Addendum)
PCP - Dr. Lupe Carney- I requested last office not and  labs.  Cardiologist - Dr. Mariah Milling  Chest x-ray - 09/22/2019  EKG - 09/19/19  Stress Test - no  ECHO - 08/25/19  Cardiac Cath - 11/20/19  Sleep Study - no CPAP - no  LABS-CBC, CMP, PT, PTT, Hgb A1C, ABG, UA, PCR  ASA- will not take day of surgery  ERAS-no  HA1C-8.2 Fasting Blood Sugar - 90 Checks Blood Sugar _1____ times a day  Anesthesia-  Pt denies having chest pain, sob, or fever at this time. All instructions explained to the pt, with a verbal understanding of the material. Pt agrees to go over the instructions while at home for a better understanding. Pt also instructed to self quarantine after being tested for COVID-19. The opportunity to ask questions was provided. Chase Saunders states that he becomes short of breath when exertioing himself- like mowing - Drs told him to not mow anymore.  I called and left a voice message for Chase Saunders and asked if she had seen A1C of 8.2 and to tell her that Urine specimen will be ran soon.  I called Radiology and spoke to Chase Saunders about [name]'s CXR done yesterday and has not been read yet, OR in AM. Chase Saunders said she will locate it and move it to the front.

## 2019-09-22 NOTE — Pre-Procedure Instructions (Signed)
Chase Saunders  09/22/2019     Your procedure is scheduled on Wednesday, July 21..  Report to [facility], Main Entrance or Entrance "A" at 6:30 AM                     Your surgery or procedure is scheduled to begin at 8:25 AM                           Call this number if you have problems the morning of surgery:[phone]  This is the number for the Pre- Surgical Desk.    For any other questions, please call [phone], Monday - Friday 8 AM - 4 PM.   Remember:  Do not eat or drink after midnight Tuesday, July 21.   Take these medicines the morning of surgery with A SIP OF WATER : citalopram (CELEXA     May take nitroGLYCERIN (NITROSTAT) if needed.  Follow Dr Barry Dienes instructions  Regarding Aspirin.  STOP taking Aspirin, Aspirin Products (Goody Powder, Excedrin Migraine), Ibuprofen (Advil), Naproxen (Aleve), Vitamins and Herbal Products (ie Fish Oil, CInnamon).   WHAT DO I DO ABOUT MY DIABETES MEDICATION? . THE NIGHT BEFORE SURGERY, take ____8___ units of _Lantus insulin.       . THE MORNING OF SURGERY, take _____ 8___ units of _Lantus insulin, if CBG is greater than 70  . Do not take oral diabetes medicines (pills) the morning of surgery  metFORMIN (GLUCOPHAGE-XR)  .  How to Manage Your Diabetes Before and After Surgery  Why is it important to control my blood sugar before and after surgery? . Improving blood sugar levels before and after surgery helps healing and can limit problems. . A way of improving blood sugar control is eating a healthy diet by: o  Eating less sugar and carbohydrates o  Increasing activity/exercise o  Talking with your doctor about reaching your blood sugar goals . High blood sugars (greater than 180 mg/dL) can raise your risk of infections and slow your recovery, so you will need to focus on controlling your diabetes during the weeks before surgery. . Make sure that the doctor who takes care of your diabetes knows about your  planned surgery including the date and location.  How do I manage my blood sugar before surgery? . Check your blood sugar at least 4 times a day, starting 2 days before surgery, to make sure that the level is not too high or low. o Check your blood sugar the morning of your surgery when you wake up and every 2 hours until you get to the Short Stay unit. . If your blood sugar is less than 70 mg/dL, you will need to treat for low blood sugar: o Do not take insulin. o Treat a low blood sugar (less than 70 mg/dL) with  cup of clear juice (cranberry or apple), 4 glucose tablets, OR glucose gel. Recheck blood sugar in 15 minutes after treatment (to make sure it is greater than 70 mg/dL). If your blood sugar is not greater than 70 mg/dL on recheck, call  o [phone] for further instructions. . Report your blood sugar to the short stay nurse when you get to Short Stay.  . If you are admitted to the hospital after surgery: o Your blood sugar will be checked by the staff and you will probably be given insulin after surgery (instead of oral diabetes medicines) to make sure you  have good blood sugar levels. o The goal for blood sugar control after surgery is 80-180 mg/dL.   Special instructions:   [facility]- Preparing For Surgery  Before surgery, you can play an important role. Because skin is not sterile, your skin needs to be as free of germs as possible. You can reduce the number of germs on your skin by washing with CHG (chlorahexidine gluconate) Soap before surgery.  CHG is an antiseptic cleaner which kills germs and bonds with the skin to continue killing germs even after washing.    Oral Hygiene is also important to reduce your risk of infection.  Remember - BRUSH YOUR TEETH THE MORNING OF SURGERY WITH YOUR REGULAR TOOTHPASTE  Please do not use if you have an allergy to CHG or antibacterial soaps. If your skin becomes reddened/irritated stop using the CHG.  Do not shave (including legs and  underarms) for at least 48 hours prior to first CHG shower. It is OK to shave your face.  Please follow these instructions carefully.   1. Shower the NIGHT BEFORE SURGERY and the MORNING OF SURGERY with CHG.   2. If you chose to wash your hair, wash your hair first as usual with your normal shampoo.  3. After you shampoo, wash your face and private area with the soap you use at home, then rinse your hair and body thoroughly to remove the shampoo and soap.  4. Use CHG as you would any other liquid soap. You can apply CHG directly to the skin and wash gently with a scrungie or a clean washcloth.   5. Apply the CHG Soap to your body ONLY FROM THE NECK DOWN.  Do not use on open wounds or open sores. Avoid contact with your eyes, ears, mouth and genitals (private parts).   6. Wash thoroughly, paying special attention to the area where your surgery will be performed.  7. Thoroughly rinse your body with warm water from the neck down.  8. DO NOT shower/wash with your normal soap after using and rinsing off the CHG Soap.  9. Pat yourself dry with a CLEAN TOWEL.  10. Wear CLEAN PAJAMAS to bed the night before surgery, wear comfortable clothes the morning of surgery  11. Place CLEAN SHEETS on your bed the night of your first shower and DO NOT SLEEP WITH PETS.   Day of Surgery:. Shower as instructed above. Do not wear lotions, powders, or colognes, or deodorant. Please wear clean clothes to the hospital/surgery center.   Remember to brush your teeth WITH YOUR REGULAR TOOTHPASTE.  Do not wear jewelry, make-up or nail polish.  Men may shave face and neck.  Do not bring valuables to the hospital.  [facility] is not responsible for any belongings or valuables.  Contacts, dentures or bridgework may not be worn into surgery.  Leave your suitcase in the car.  After surgery it may be brought to your room.  For patients admitted to the hospital, discharge time will be determined by your treatment  team.  Patients discharged the day of surgery will not be allowed to drive home.   Please read over the  fact sheets that you were given.

## 2019-09-22 NOTE — Patient Instructions (Signed)
Stop taking metformin  Continue taking all other medications without change through the day before surgery.  Make sure to bring all of your medications with you when you come for your Pre-Admission Testing appointment at [facility] Short-Stay Department.  Have nothing to eat or drink after midnight the night before surgery.  On the morning of surgery do not take any medications  At your appointment for Pre-Admission Testing at the [facility] Short-Stay Department you will be asked to sign permission forms for your upcoming surgery.  By definition your signature on these forms implies that you and/or your designee provide full informed consent for your planned surgical procedure(s), that alternative treatment options have been discussed, that you understand and accept any and all potential risks, and that you have some understanding of what to expect for your post-operative convalescence.  For any major cardiac surgical procedure potential operative risks include but are not limited to at least some risk of death, stroke or other neurologic complication, myocardial infarction, congestive heart failure, respiratory failure, renal failure, bleeding requiring blood transfusion and/or reexploration, irregular heart rhythm, heart block or bradycardia requiring permanent pacemaker, pneumonia, pericardial effusion, pleural effusion, wound infection, pulmonary embolus or other thromboembolic complication, chronic pain, or other complications related to the specific procedure(s) performed.  Please call to schedule a follow-up appointment in our office prior to surgery if you have any unresolved questions about your planned surgical procedure, the associated risks, alternative treatment options, and/or expectations for your post-operative recovery.

## 2019-09-22 NOTE — H&P (View-Only) (Signed)
     [address]       [place] [address]             [phone]     CARDIOTHORACIC SURGERY CONSULTATION REPORT  Referring Provider is Gollan, Timothy J, MD PCP is Mitchell, L.Dean, MD  Chief Complaint  Patient presents with  . Aortic Stenosis    new patient consultation, cath 09/19/19  . Coronary Artery Disease    HPI:  Patient is a 69-year-old male with history of aortic stenosis, hypertension, hyperlipidemia, and type 2 diabetes mellitus who has been referred for surgical consultation to discuss treatment options for management of severe symptomatic aortic stenosis and multivessel coronary artery disease with accelerating symptoms of angina pectoris.  Patient states that he has known of a heart murmur all of his life.  He has been followed for the last several years by Dr. Gollan with known history of aortic stenosis.  Echocardiogram performed in 2015 suggested the presence of bicuspid aortic valve with normal left ventricular systolic function and moderate aortic valve stenosis.  The patient states that over the past year or more he has developed progressive symptoms of exertional shortness of breath and chest discomfort.  Symptoms typically occur with more strenuous physical exertion such as mowing the lawn.  Symptoms have accelerated dramatically over the past 8 weeks, although the patient continues to deny any history of substernal chest discomfort or shortness of breath at rest or with low-level activity.  Recent follow-up echocardiogram performed August 25, 2019 revealed normal left ventricular systolic function with severe aortic stenosis.  Peak velocity across aortic valve measured as high as 4.4 m/s corresponding to mean transvalvular gradient estimated 52.5 mmHg and aortic valve area calculated 0.84 cm by VTI.  The DVI was reported 0.27 and there was reportedly mild aortic insufficiency.  Left ventricular ejection fraction was reported 60 to 65%.  Diagnostic  cardiac catheterization was performed September 19, 2019.  Catheterization confirmed the presence of severe aortic stenosis with mean transvalvular gradient measured 56 mmHg by catheterization.  Right heart pressures were mildly elevated.  There was severe multivessel coronary artery disease including some ostial disease in the left main coronary artery and the ostial left anterior descending coronary artery with high-grade long segment stenosis of the mid left anterior descending coronary artery.  Cardiothoracic surgical consultation was requested.  Patient is married and lives locally in [place] with his wife.  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  He reports that he lives a somewhat sedentary lifestyle and he does not exercise on a regular basis.  However, he reports no significant physical limitations other than the fact that over the past year he has developed progressive symptoms of exertional shortness of breath and chest discomfort as noted previously.  He denies any history of chest pain or chest tightness at rest or at night when he is sleeping.  He has not had resting shortness of breath.  He denies PND, orthopnea, or lower extremity edema.  He reports occasional dizzy spells, particularly if he bends forward at the waist.  He has not had any syncopal events although he states that he has come close to passing out.  Past Medical History:  Diagnosis Date  . Asthma    Childhood  . Cataract   . Coronary artery disease   . Diabetes mellitus without complication (HCC)   . Heart murmur   . Hyperlipidemia   . Hypertension   . Severe aortic valve   stenosis     Past Surgical History:  Procedure Laterality Date  . RIGHT/LEFT HEART CATH AND CORONARY ANGIOGRAPHY Bilateral 09/19/2019   Procedure: RIGHT/LEFT HEART CATH AND CORONARY ANGIOGRAPHY;  Surgeon: Gollan, Timothy J, MD;  Location: [facility] INVASIVE CV [facility];  Service: Cardiovascular;  Laterality: Bilateral;  . TONSILLECTOMY AND  ADENOIDECTOMY  1969  . WISDOM TOOTH EXTRACTION      Family History  Problem Relation Age of Onset  . Alcohol abuse Mother   . Hypertension Mother   . Cancer Father 80       Lung Cancer - smoker  . Heart disease Father        Valve Replacement  . Diabetes Paternal Grandmother     Social History   Socioeconomic History  . Marital status: Married    Spouse name: Pamela  . Number of children: 2  . Years of education: 16  . Highest education level: Not on file  Occupational History  . Occupation: Voice Over Artist    Comment: [employer]  Tobacco Use  . Smoking status: Former Smoker    Packs/day: 1.50    Years: 25.00    Pack years: 37.50    Quit date: 03/30/1997    Years since quitting: 22.4  . Smokeless tobacco: Never Used  Vaping Use  . Vaping Use: Never used  Substance and Sexual Activity  . Alcohol use: No  . Drug use: No  . Sexual activity: Not on file  Other Topics Concern  . Not on file  Social History Narrative   Oluwatimilehin grew up in [place], [place]. He attended [employer] and obtained his Bachelors in Communication. He moved to the area from [place], [place]. He lives at home with his wife. They have a son and daughter. They have 4 grandchildren (3 grandson, 1 granddaughter). 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 He enjoys fishing. He also enjoys watching movies. He really considers himself a "home body".   Social Determinants of Health   Financial Resource Strain:   . Difficulty of Paying Living Expenses:   Food Insecurity:   . Worried About Running Out of Food in the Last Year:   . Ran Out of Food in the Last Year:   Transportation Needs:   . Lack of Transportation (Medical):   . Lack of Transportation (Non-Medical):   Physical Activity:   . Days of Exercise per Week:   . Minutes of Exercise per Session:   Stress:   . Feeling of Stress :   Social Connections:   . Frequency of Communication with Friends and Family:     . Frequency of Social Gatherings with Friends and Family:   . Attends Religious Services:   . Active Member of Clubs or Organizations:   . Attends Club or Organization Meetings:   . Marital Status:   Intimate Partner Violence:   . Fear of Current or Ex-Partner:   . Emotionally Abused:   . Physically Abused:   . Sexually Abused:     Current Outpatient Medications  Medication Sig Dispense Refill  . aspirin EC 81 MG tablet Take 81 mg by mouth at bedtime.     . CINNAMON PO Take 200 mcg by mouth daily.    . citalopram (CELEXA) 20 MG tablet TAKE 1 TABLET BY MOUTH EVERY DAY (Patient taking differently: Take 20 mg by mouth daily. ) 30 tablet 2  . ibuprofen (ADVIL) 200 MG tablet Take 400 mg by mouth every 6 (six)   hours as needed for headache.    . Insulin Pen Needle 31G X 8 MM MISC Use as directed 100 each 3  . LANTUS SOLOSTAR 100 UNIT/ML Solostar Pen Inject 10-16 Units into the skin daily. Take 16 mg in the morning and 10 mg at bedtime    . losartan (COZAAR) 50 MG tablet Take 0.5 tablets (25 mg total) by mouth daily. 30 tablet 0  . lovastatin (MEVACOR) 40 MG tablet Take 1 tablet (40 mg total) by mouth at bedtime. 90 tablet 3  . metFORMIN (GLUCOPHAGE-XR) 500 MG 24 hr tablet Take 500 mg by mouth at bedtime.     . Multiple Vitamins-Minerals (MENS MULTIVITAMIN PLUS) TABS Take by mouth daily.    . nitroGLYCERIN (NITROSTAT) 0.4 MG SL tablet Place 1 tablet (0.4 mg total) under the tongue every 5 (five) minutes as needed for chest pain. 25 tablet 3   No current facility-administered medications for this visit.    No Known Allergies    Review of Systems:   General:  normal appetite, decreased energy, no weight gain, no weight loss, no fever  Cardiac:  + chest pain with exertion, no chest pain at rest, +SOB with exertion, no resting SOB, no PND, no orthopnea, no palpitations, no arrhythmia, no atrial fibrillation, no LE edema, + dizzy spells, no syncope  Respiratory:  exertional shortness of  breath, no home oxygen, no productive cough, no dry cough, no bronchitis, no wheezing, no hemoptysis, no asthma, no pain with inspiration or cough, no sleep apnea, no CPAP at night  GI:   no difficulty swallowing, no reflux, no frequent heartburn, no hiatal hernia, no abdominal pain, no constipation, no diarrhea, no hematochezia, no hematemesis, no melena  GU:   no dysuria,  + frequency, no urinary tract infection, no hematuria, no enlarged prostate, no kidney stones, no kidney disease  Vascular:  no pain suggestive of claudication, no pain in feet, no leg cramps, no varicose veins, no DVT, no non-healing foot ulcer  Neuro:   no stroke, no TIA's, no seizures, no headaches, no temporary blindness one eye,  no slurred speech, no peripheral neuropathy, no chronic pain, no instability of gait, no memory/cognitive dysfunction  Musculoskeletal: + arthritis, no joint swelling, no myalgias, no difficulty walking, normal mobility   Skin:   no rash, no itching, no skin infections, no pressure sores or ulcerations  Psych:   no anxiety, no depression, no nervousness, + unusual recent stress  Eyes:   no blurry vision, no floaters, no recent vision changes, + wears glasses or contacts  ENT:   no hearing loss, no loose or painful teeth, partial dentures, last saw dentist within the past year  Hematologic:  no easy bruising, no abnormal bleeding, no clotting disorder, no frequent epistaxis  Endocrine:  + diabetes, does check CBG's at home     Physical Exam:   BP (!) 142/77 (BP Location: Right Arm, Patient Position: Sitting, Cuff Size: Normal)   Pulse 91   Temp 97.7 F (36.5 C)   Resp 18   Ht 5' 10" (1.778 m)   Wt 214 lb (97.1 kg)   SpO2 94% Comment: ra  BMI 30.71 kg/m   General:  Mildly obese, well-appearing  HEENT:  Unremarkable   Neck:   no JVD, no bruits, no adenopathy   Chest:   clear to auscultation, symmetrical breath sounds, no wheezes, no rhonchi   CV:   RRR, grade III/VI crescendo/decrescendo  systolic murmur   Abdomen:  soft, non-tender, no   masses   Extremities:  warm, well-perfused, pulses palpable, no LE edema  Rectal/GU  Deferred  Neuro:   Grossly non-focal and symmetrical throughout  Skin:   Clean and dry, no rashes, no breakdown   Diagnostic Tests:  ECHOCARDIOGRAM REPORT       Patient Name:  Tlaloc B Farrior Date of Exam: 08/25/2019  Medical Rec #: [id number]     Height:    69.5 in  Accession #:  [id number]    Weight:    212.5 lb  Date of Birth: 06/06/1950    BSA:     2.130 m  Patient Age:  68 years     BP:      122/66 mmHg  Patient Gender: M         HR:      91 bpm.  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 Aortic Valve Disease,  Signs/Symptoms:Chest         Pain, Dyspnea and Dizziness/Lightheadedness; Risk  Factors:Former         Smoker. C/o worsening DOE and chest pains with exertion  that he         did not used to notice, as well as fatigue.    Sonographer:  Melissa Church BS, RVT, RDCS  Referring Phys: [id number] TIMOTHY J GOLLAN   IMPRESSIONS    1. Left ventricular ejection fraction, by estimation, is 60 to 65%. The  left ventricle has normal function. The left ventricle has no regional  wall motion abnormalities. There is mild left ventricular hypertrophy.  Left ventricular diastolic parameters  are consistent with Grade I diastolic dysfunction (impaired relaxation).  2. Right ventricular systolic function is normal. The right ventricular  size is normal. There is normal pulmonary artery systolic pressure.  3. The mitral valve is normal in structure. No evidence of mitral valve  regurgitation. No evidence of mitral stenosis.  4. The  aortic valve is abnormal. Aortic valve regurgitation is mild.  Severe aortic valve stenosis. Aortic valve area, by VTI measures 0.84 cm.  Aortic valve mean gradient measures 52.5 mmHg.   Comparison(s): Previous Echo showed LV EF 55-60%, no RWMA, grade I  diastolic dysfunction. Moderate AS, severely calcified valves, could not  exclude bicuspid morphology, mean gradient 24 mmHg.   FINDINGS  Left Ventricle: Left ventricular ejection fraction, by estimation, is 60  to 65%. The left ventricle has normal function. The left ventricle has no  regional wall motion abnormalities. The left ventricular internal cavity  size was normal in size. There is  mild left ventricular hypertrophy. Left ventricular diastolic parameters  are consistent with Grade I diastolic dysfunction (impaired relaxation).   Right Ventricle: The right ventricular size is normal. No increase in  right ventricular wall thickness. Right ventricular systolic function is  normal. There is normal pulmonary artery systolic pressure. The tricuspid  regurgitant velocity is 2.33 m/s, and  with an assumed right atrial pressure of 10 mmHg, the estimated right  ventricular systolic pressure is 31.7 mmHg.   Left Atrium: Left atrial size was normal in size.   Right Atrium: Right atrial size was normal in size.   Pericardium: There is no evidence of pericardial effusion.   Mitral Valve: The mitral valve is normal in structure. Normal mobility of    the mitral valve leaflets. Moderate mitral annular calcification. No  evidence of mitral valve regurgitation. No evidence of mitral valve  stenosis.   Tricuspid Valve: The tricuspid valve is normal in structure. Tricuspid  valve regurgitation is trivial. No evidence of tricuspid stenosis.   Aortic Valve: The aortic valve is abnormal. . There is severe thickening  and severe calcifcation of the aortic valve. Aortic valve regurgitation is  mild. Aortic regurgitation PHT measures 333  msec. Severe aortic stenosis  is present. There is severe  thickening of the aortic valve. There is severe calcifcation of the aortic  valve. Aortic valve mean gradient measures 52.5 mmHg. Aortic valve peak  gradient measures 76.7 mmHg. Aortic valve area, by VTI measures 0.84 cm.   Pulmonic Valve: The pulmonic valve was normal in structure. Pulmonic valve  regurgitation is mild. No evidence of pulmonic stenosis.   Aorta: The aortic root is normal in size and structure.   Venous: The inferior vena cava was not well visualized.   IAS/Shunts: No atrial level shunt detected by color flow Doppler.     LEFT VENTRICLE  PLAX 2D  LVIDd:     5.80 cm   Diastology  LVIDs:     3.60 cm   LV e' lateral:  6.96 cm/s  LV PW:     1.10 cm   LV E/e' lateral: 14.1  LV IVS:    1.10 cm   LV e' medial:  5.00 cm/s  LVOT diam:   2.00 cm   LV E/e' medial: 19.6  LV SV:     88  LV SV Index:  41  LVOT Area:   3.14 cm                 3D Volume EF:  LV Volumes (MOD)      3D EF:    57 %  LV vol d, MOD A2C: 69.0 ml LV EDV:    161 ml  LV vol d, MOD A4C: 106.0 ml LV ESV:    70 ml  LV vol s, MOD A2C: 49.8 ml LV SV:    91 ml  LV vol s, MOD A4C: 37.0 ml  LV SV MOD A2C:   19.2 ml  LV SV MOD A4C:   106.0 ml  LV SV MOD BP:   48.2 ml   RIGHT VENTRICLE  RV Basal diam: 3.80 cm  RV Mid diam:  2.00 cm  RV S prime:   15.60 cm/s  TAPSE (M-mode): 2.0 cm   LEFT ATRIUM       Index    RIGHT ATRIUM      Index  LA diam:    4.30 cm 2.02 cm/m RA Area:   15.30 cm  LA Vol (A2C):  49.6 ml 23.28 ml/m RA Volume:  37.50 ml 17.60 ml/m  LA Vol (A4C):  45.9 ml 21.54 ml/m  LA Biplane Vol: 48.0 ml 22.53 ml/m  AORTIC VALVE          PULMONIC VALVE  AV Area (Vmax):  0.91 cm   PV Vmax:    1.16 m/s  AV Area (Vmean):  0.79 cm   PV Vmean:   95.200 cm/s  AV Area (VTI):   0.84 cm   PV  VTI:    0.258 m  AV Vmax:      438.00 cm/s PV Peak grad: 5.4 mmHg  AV Vmean:     347.500 cm/s PV Mean grad: 4.0 mmHg  AV VTI:        1.055 m  AV Peak Grad:   76.7 mmHg  AV Mean Grad:   52.5 mmHg  LVOT Vmax:     127.00 cm/s  LVOT Vmean:    87.100 cm/s  LVOT VTI:     0.281 m  LVOT/AV VTI ratio: 0.27  AI PHT:      333 msec    AORTA  Ao Root diam: 3.30 cm  Ao Asc diam: 3.10 cm   MITRAL VALVE        TRICUSPID VALVE  MV Area (PHT): 3.77 cm   TR Peak grad:  21.7 mmHg  MV Decel Time: 201 msec   TR Vmax:    233.00 cm/s  MV E velocity: 98.20 cm/s  MV A velocity: 148.00 cm/s SHUNTS  MV E/A ratio: 0.66     Systemic VTI: 0.28 m               Systemic Diam: 2.00 cm   Muhammad Arida MD  Electronically signed by Muhammad Arida MD  Signature Date/Time: 08/26/2019/5:38:25 PM      RIGHT/LEFT HEART CATH AND CORONARY ANGIOGRAPHY  Conclusion   Mid LAD lesion is 75% stenosed.  Ost LM lesion is 40% stenosed.  Ost LAD to Prox LAD lesion is 40% stenosed.  The left ventricular ejection fraction is 55-65% by visual estimate.  LV end diastolic pressure is normal.  The left ventricular systolic function is normal.  There is no mitral valve regurgitation.  There is severe aortic valve stenosis.   Indications  Aortic stenosis, moderate [I35.0 (ICD-10-CM)]  Procedural Details  Technical Details Cardiac Catheterization Procedure Note  Name: Damari B Gough MRN: [id number] DOB: 11/28/1950  Procedure: Left Heart Cath, Selective Coronary Angiography, LV angiography, right heart catheterization  Indication:   68-year-old male  with history of smoking for 20-25 years who stopped 20 years ago,  history of diabetes,  obesity,  hypertension,  moderate aortic valve stenosis in 2015, normal ejection fraction Severe aortic valve stenosis on echocardiogram in 2021 Worsening chest discomfort, shortness of  breath symptoms on exertion over the past several weeks to months Presented for cardiac catheterization, preop evaluation for aortic valve surgery  Procedural details: The right groin was prepped, draped, and anesthetized with 1% lidocaine. Using modified Seldinger technique, a 5 French sheath was introduced into the right femoral artery.  7 French sheath placed into right femoral vein . standard Judkins catheters (JL 4, JR 4 and pigtail catheter) were used for coronary angiography and left ventriculography.  Swan-Ganz catheter used to obtain right heart pressures . catheter exchanges were performed over a guidewire. There were no immediate procedural complications. The patient was transferred to the post catheterization recovery area for further monitoring.  Moderate sedation: 1. Sedation used: 1 mg Versed, 25 mcg fentanyl 2. Time of administration:       9:45 AM     Time patient left for recovery 10:30 AM Total sedation time 45 minutes 3. I was Face to Face with the patient during this time: (code: [id number])   Procedural Findings:  Hemodynamics:     AO 113/64    LV 169/6/18   Coronary angiography:  Coronary dominance: Right  Left mainstem:   Large vessel that bifurcates into the LAD and left circumflex, mild ostial left main disease, select images with ventricularization of waveform on engagement of the left main  Left anterior descending (LAD):   Large vessel that extends to the apical region, diagonal branch 2 of moderate size, severe proximal to mid LAD disease    Left circumflex (LCx):  Large vessel with OM branch 2, no significant disease noted  Right coronary artery (RCA):  Right dominant vessel with PL and PDA, no significant disease noted  Left ventriculography: Left ventricular systolic function is normal, LVEF is estimated at 55-65%, there is no significant mitral regurgitation ,  Severe aortic valve stenosis, mean gradient estimated 56 mmHg LV pressure 169/6/18 Ao pressure  on pullback 113/64  Right heart catheterization RA 10/7/5 RV 33/4/9 PA 28/11/18 Pulmonary capillary wedge pressure 10/5/4 Cardiac output 12.39, cardiac index 5.78    Final Conclusions:   Single-vessel disease of the LAD Otherwise mild luminal irregularities noted of the left circumflex and RCA system Severe aortic valve stenosis mean gradient close to 60 mmHg   Recommendations:  Referral to CT surgery for consideration of aortic valve replacement, LIMA to the LAD Given worsening unstable angina symptoms, a hybrid approach may be less beneficial though worth a discussion  Timothy Gollan 09/19/2019, 10:27 AM  Estimated blood loss <50 mL.   During this procedure medications were administered to achieve and maintain moderate conscious sedation while the patient's heart rate, blood pressure, and oxygen saturation were continuously monitored and I was present face-to-face 100% of this time.  Medications (Filter: Administrations occurring from 0840 to 1026 on 09/19/19) (important) Continuous medications are totaled by the amount administered until 09/19/19 1026.  fentaNYL (SUBLIMAZE) injection (mcg) Total dose:  25 mcg Date/Time  Rate/Dose/Volume Action  09/19/19 0900  25 mcg Given    midazolam (VERSED) injection (mg) Total dose:  1 mg Date/Time  Rate/Dose/Volume Action  09/19/19 0900  1 mg Given    lidocaine (PF) (XYLOCAINE) 1 % injection (mL) Total volume:  30 mL Date/Time  Rate/Dose/Volume Action  09/19/19 0908  30 mL Given    iohexol (OMNIPAQUE) 300 MG/ML solution (mL) Total volume:  130 mL Date/Time  Rate/Dose/Volume Action  09/19/19 1009  130 mL Given    Heparin (Porcine) in NaCl 1000-0.9 UT/500ML-% SOLN (mL) Total volume:  500 mL Date/Time  Rate/Dose/Volume Action  09/19/19 1009  500 mL Given    Sedation Time  Sedation Time Physician-1: 1 hour 10 minutes 16 seconds  Contrast  Medication Name Total Dose  iohexol (OMNIPAQUE) 300 MG/ML solution 130 mL      Radiation/Fluoro  Fluoro time: 10.5 (min) DAP: 40.3 (Gycm2) Cumulative Air Kerma: 470 (mGy)  Coronary Findings  Diagnostic Dominance: Right Left Main  Ost LM lesion is 40% stenosed.  Left Anterior Descending  Ost LAD to Prox LAD lesion is 40% stenosed.  Mid LAD lesion is 75% stenosed.  Intervention  No interventions have been documented. Wall Motion  Resting               Left Heart  Left Ventricle The left ventricular size is normal. The left ventricular systolic function is normal. LV end diastolic pressure is normal. The left ventricular ejection fraction is 55-65% by visual estimate. No regional wall motion abnormalities. There is no evidence of mitral regurgitation.  Aortic Valve There is severe aortic valve stenosis. The aortic valve is calcified. There is restricted aortic valve motion.  Coronary Diagrams  Diagnostic Dominance: Right  Intervention  Implants   Vascular Products  Device Closure Mynxgrip 5f - [id number] - Implanted Inventory item: DEVICE CLOSURE MYNXGRIP 5F Model/Cat number: [id number]  Manufacturer: [employer] Lot number: [id number]  Device identifier: [id number] Device identifier type: GS1  Area Of Implantation: Groin    GUDID Information  Request status Successful    Brand name: MYNXGRIP   Version/Model: MX5021  Company name: [employer] MRI safety info as of 09/19/19: MR Safe  Contains dry or latex rubber: No    GMDN P.T. name: Wound hydrogel dressing, non-antimicrobial    As of 09/19/2019  Status: Implanted      Syngo Images  Show images for CARDIAC CATHETERIZATION Images on Long Term Storage  Show images for [place], [employer] B Link to Procedure Log  Procedure Log    Hemo Data (last day) before discharge   AO Systolic Cath Pressure  AO Diastolic Cath Pressure  AO Mean Cath Pressure  LV Systolic Cath Pressure  LV End Diastolic  LV Systolic  LV End Diastolic  LV dP/dt  PA Systolic Cath Pressure  PA Diastolic Cath  Pressure  PA Mean Cath Pressure  RA Wedge A Wave  RA Wedge V Wave  RV Systolic Cath Pressure  RV Diastolic Cath Pressure  RV End Diastolic  RV Systolic  RV End Diastolic  RV dP/dt  PCW A Wave  PCW V Wave  PCW Mean  AO O2 Sat  PA O2 Sat  AO O2 Sat  Fick C.O.  Fick C.I.   --  --  --  --  --  169 mmHg  18 mmHg  1392 mmHg/sec  --  --  --  10 mmHg  7 mmHg  --  --  --  33 mmHg  9 mmHg  480 mmHg/sec  10 mmHg  5 mmHg  4 mmHg  --  --  --  12.39 L/min  5.78 L/min/m2   --  --  --  --  --  --  --  --  28 mmHg  11 mmHg  18 mmHg  --  --  --  --  --  --  --  --  --  --  --  --  --  --  --  --   --  --  --  --  --  --  --  --  --  --  --  --  --  --  --  --  --  --  --  --  --  --  96.5 %  --  SA  --  --   --  --  --  --  --  --  --  --  --  --  --  --  --  --  --  --  --  --  --  --  --  --  --  MV  --  --  --   --  --  --  --  --  --  --  --  --  --  --  --  --  33 mmHg  4 mmHg  9 mmHg  --  --  --  --  --  --  --  --  --  --  --   105  55 mmHg  77 mmHg  --  --  --  --  --  --  --  --  --  --  --  --  --  --  --  --  --  --  --  --  --  --  --  --   --  --  --  166 mmHg  18 mmHg  --  --  --  --  --  --  --  --  --  --  --  --  --  --  --  --  --  --  --  --  --  --   --  --  --    164 mmHg  20 mmHg  --  --  --  --  --  --  --  --  --  --  --  --  --  --  --  --  --  --  --  --  --  --   --  --  --  169 mmHg  18 mmHg  --  --  --  --  --  --  --  --  --  --  --  --  --  --  --  --  --  --  --  --  --  --   113  64 mmHg  86 mmHg  --  --  --  --  --                                            EKG: NSR w/out acute ischemic changes or significant AV conduction delay (09/19/2019)    Impression:  Patient has stage D severe symptomatic aortic stenosis as well as severe coronary artery disease with accelerating symptoms of exertional chest pain and shortness of breath consistent with angina pectoris and chronic diastolic congestive heart failure, [place] Heart Association functional class II.  I have personally reviewed  the patient's recent transthoracic echocardiogram and diagnostic cardiac catheterization.  Echocardiogram reveals the presence of normal left ventricular systolic function with severe aortic stenosis.  The aortic valve is likely bicuspid (Sievers type I).  There is severe aortic stenosis with severely restricted leaflet mobility with moderately severe thickening of both leaflets.  Peak velocity across aortic valve measured greater than 4.3 m/s corresponding to mean transvalvular gradient estimated greater than 50 mmHg.  Mean transvalvular gradients performed at diagnostic cardiac catheterization were greater than 50 mmHg as well.  Catheterization also revealed the presence of severe coronary artery disease with long segment high-grade stenosis of the mid left anterior descending coronary artery.  There is also some ostial stenosis of the left main coronary artery with ostial stenosis of the left anterior descending coronary artery which I feel may be hemodynamically significant.  Right heart pressures were mildly elevated.  I feel the patient would best be treated with combined aortic valve replacement and coronary artery bypass grafting using conventional surgical techniques.  I am concerned by the presence of significant left main and ostial left anterior descending coronary artery stenosis.  Moreover, the long segment stenosis of the mid left anterior descending coronary artery might not be well suited to PCI and stenting.  Given the patient's relatively young age, the presence of a bicuspid aortic valve, and anatomical concerns related to the possible use of PCI for treating his coronary artery disease, I cannot recommend PCI and stenting with transcatheter aortic valve replacement as an alternative to conventional surgery.   Plan:  The patient and his wife were counseled at length regarding treatment alternatives for management of severe symptomatic aortic stenosis and coronary artery disease.  Alternative approaches such as conventional aortic valve replacement with coronary artery bypass grafting, transcatheter aortic valve replacement with or without PCI and stenting of the LAD, and continued medical therapy without intervention were compared and contrasted at length.  The risks associated with conventional surgery were discussed in detail, as were expectations for post-operative convalescence, alternative surgical approaches, prosthetic valve choices, and the presence of significant CAD which needs intervention.  Issues specific to transcatheter aortic   valve replacement were discussed including questions about long term valve durability, the potential for paravalvular leak, possible increased risk of need for permanent pacemaker placement, the suitability of the patient's surgical anatomy, and other potential technical complications related to the procedure itself.  Long-term prognosis without surgical intervention was discussed.  All treatment options were discussed in the context of the patient's own specific clinical presentation, past medical history, long-term prognosis and life expectancy.  All of their questions have been addressed.  The patient desires to proceed with aortic valve replacement and coronary artery bypass grafting as soon as possible.  Discussion was held comparing the relative risks of mechanical valve replacement with need for lifelong anticoagulation versus use of a bioprosthetic tissue valve and the associated potential for late structural valve deterioration and failure.  This discussion was placed in the context of the patient's particular circumstances, and as a result the patient specifically requests that their valve be replaced using a bioprosthetic tissue valve. The patient understands and accepts all potential associated risks of surgery including but not limited to risk of death, stroke, myocardial infarction, congestive heart failure, respiratory failure, renal  failure, pneumonia, bleeding requiring blood transfusion and or reexploration, arrhythmia, heart block or bradycardia requiring permanent pacemaker, aortic dissection or other major vascular complication, pleural effusions or other delayed complications related to continued congestive heart failure, other late complications related to valve replacement including structural valve deterioration and failure, thrombosis, endocarditis, or paravalvular leak, as well as late recurrence of symptomatic ischemic heart disease.  The importance of long-term risk factor modification have been discussed.  We tentatively plan for surgery on September 24, 2019.  Prior to surgery the patient will undergo noncontrast CT scan of the chest to screen for possible aneurysmal involvement of the ascending thoracic aorta given the patient's history of bicuspid aortic valve.    I spent in excess of 90 minutes during the conduct of this office consultation and >50% of this time involved direct face-to-face encounter with the patient for counseling and/or coordination of their care.    Elvera Almario H. Tamaiya Bump, MD 09/22/2019 12:58 PM  

## 2019-09-22 NOTE — Progress Notes (Signed)
[address]       Chase Saunders [address]             [phone]     CARDIOTHORACIC SURGERY CONSULTATION REPORT  Referring Provider is Mariah Milling, Tollie Pizza, MD PCP is Clovis Riley, L.[name], MD  Chief Complaint  Patient presents with  . Aortic Stenosis    new patient consultation, cath 09/19/19  . Coronary Artery Disease    HPI:  Patient is a 69 year old male with history of aortic stenosis, hypertension, hyperlipidemia, and type 2 diabetes mellitus who has been referred for surgical consultation to discuss treatment options for management of severe symptomatic aortic stenosis and multivessel coronary artery disease with accelerating symptoms of angina pectoris.  Patient states that he has known of a heart murmur all of his life.  He has been followed for the last several years by Dr. Mariah Milling with known history of aortic stenosis.  Echocardiogram performed in 2015 suggested the presence of bicuspid aortic valve with normal left ventricular systolic function and moderate aortic valve stenosis.  The patient states that over the past year or more he has developed progressive symptoms of exertional shortness of breath and chest discomfort.  Symptoms typically occur with more strenuous physical exertion such as mowing the lawn.  Symptoms have accelerated dramatically over the past 8 weeks, although the patient continues to deny any history of substernal chest discomfort or shortness of breath at rest or with low-level activity.  Recent follow-up echocardiogram performed August 25, 2019 revealed normal left ventricular systolic function with severe aortic stenosis.  Peak velocity across aortic valve measured as high as 4.4 m/s corresponding to mean transvalvular gradient estimated 52.5 mmHg and aortic valve area calculated 0.84 cm by VTI.  The DVI was reported 0.27 and there was reportedly mild aortic insufficiency.  Left ventricular ejection fraction was reported 60 to 65%.  Diagnostic  cardiac catheterization was performed September 19, 2019.  Catheterization confirmed the presence of severe aortic stenosis with mean transvalvular gradient measured 56 mmHg by catheterization.  Right heart pressures were mildly elevated.  There was severe multivessel coronary artery disease including some ostial disease in the left main coronary artery and the ostial left anterior descending coronary artery with high-grade long segment stenosis of the mid left anterior descending coronary artery.  Cardiothoracic surgical consultation was requested.  Patient is married and lives locally in [place] with his wife.  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  He reports that he lives a somewhat sedentary lifestyle and he does not exercise on a regular basis.  However, he reports no significant physical limitations other than the fact that over the past year he has developed progressive symptoms of exertional shortness of breath and chest discomfort as noted previously.  He denies any history of chest pain or chest tightness at rest or at night when he is sleeping.  He has not had resting shortness of breath.  He denies PND, orthopnea, or lower extremity edema.  He reports occasional dizzy spells, particularly if he bends forward at the waist.  He has not had any syncopal events although he states that he has come close to passing out.  Past Medical History:  Diagnosis Date  . Asthma    Childhood  . Cataract   . Coronary artery disease   . Diabetes mellitus without complication (HCC)   . Heart murmur   . Hyperlipidemia   . Hypertension   . Severe aortic valve  stenosis     Past Surgical History:  Procedure Laterality Date  . RIGHT/LEFT HEART CATH AND CORONARY ANGIOGRAPHY Bilateral 09/19/2019   Procedure: RIGHT/LEFT HEART CATH AND CORONARY ANGIOGRAPHY;  Surgeon: Antonieta Iba, MD;  Location: [facility] INVASIVE CV LAB;  Service: Cardiovascular;  Laterality: Bilateral;  . TONSILLECTOMY AND  ADENOIDECTOMY  1969  . WISDOM TOOTH EXTRACTION      Family History  Problem Relation Age of Onset  . Alcohol abuse Mother   . Hypertension Mother   . Cancer Father 87       Lung Cancer - smoker  . Heart disease Father        Valve Replacement  . Diabetes Paternal Grandmother     Social History   Socioeconomic History  . Marital status: Married    Spouse name: Rinaldo Cloud  . Number of children: 2  . Years of education: 65  . Highest education level: Not on file  Occupational History  . Occupation: Voice Over Artist    Comment: [employer]  Tobacco Use  . Smoking status: Former Smoker    Packs/day: 1.50    Years: 25.00    Pack years: 68.50    Quit date: 03/30/1997    Years since quitting: 22.4  . Smokeless tobacco: Never Used  Vaping Use  . Vaping Use: Never used  Substance and Sexual Activity  . Alcohol use: No  . Drug use: No  . Sexual activity: Not on file  Other Topics Concern  . Not on file  Social History Narrative   Chase grew up in [place], [place]. He attended [employer] and obtained his Bachelors in Special educational needs teacher. He moved to the area from [place], [place]. He lives at home with his wife. They have a son and daughter. They have 4 grandchildren (3 grandson, 1 granddaughter). 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 He enjoys fishing. He also enjoys watching movies. He really considers himself a "home body".   Social Determinants of Health   Financial Resource Strain:   . Difficulty of Paying Living Expenses:   Food Insecurity:   . Worried About Programme researcher, broadcasting/film/video in the Last Year:   . Barista in the Last Year:   Transportation Needs:   . Freight forwarder (Medical):   Marland Kitchen Lack of Transportation (Non-Medical):   Physical Activity:   . Days of Exercise per Week:   . Minutes of Exercise per Session:   Stress:   . Feeling of Stress :   Social Connections:   . Frequency of Communication with Friends and Family:     . Frequency of Social Gatherings with Friends and Family:   . Attends Religious Services:   . Active Member of Clubs or Organizations:   . Attends Banker Meetings:   Marland Kitchen Marital Status:   Intimate Partner Violence:   . Fear of Current or Ex-Partner:   . Emotionally Abused:   Marland Kitchen Physically Abused:   . Sexually Abused:     Current Outpatient Medications  Medication Sig Dispense Refill  . aspirin EC 81 MG tablet Take 81 mg by mouth at bedtime.     Marland Kitchen CINNAMON PO Take 200 mcg by mouth daily.    . citalopram (CELEXA) 20 MG tablet TAKE 1 TABLET BY MOUTH EVERY DAY (Patient taking differently: Take 20 mg by mouth daily. ) 30 tablet 2  . ibuprofen (ADVIL) 200 MG tablet Take 400 mg by mouth every 6 (six)  hours as needed for headache.    . Insulin Pen Needle 31G X 8 MM MISC Use as directed 100 each 3  . LANTUS SOLOSTAR 100 UNIT/ML Solostar Pen Inject 10-16 Units into the skin daily. Take 16 mg in the morning and 10 mg at bedtime    . losartan (COZAAR) 50 MG tablet Take 0.5 tablets (25 mg total) by mouth daily. 30 tablet 0  . lovastatin (MEVACOR) 40 MG tablet Take 1 tablet (40 mg total) by mouth at bedtime. 90 tablet 3  . metFORMIN (GLUCOPHAGE-XR) 500 MG 24 hr tablet Take 500 mg by mouth at bedtime.     . Multiple Vitamins-Minerals (MENS MULTIVITAMIN PLUS) TABS Take by mouth daily.    . nitroGLYCERIN (NITROSTAT) 0.4 MG SL tablet Place 1 tablet (0.4 mg total) under the tongue every 5 (five) minutes as needed for chest pain. 25 tablet 3   No current facility-administered medications for this visit.    No Known Allergies    Review of Systems:   General:  normal appetite, decreased energy, no weight gain, no weight loss, no fever  Cardiac:  + chest pain with exertion, no chest pain at rest, +SOB with exertion, no resting SOB, no PND, no orthopnea, no palpitations, no arrhythmia, no atrial fibrillation, no LE edema, + dizzy spells, no syncope  Respiratory:  exertional shortness of  breath, no home oxygen, no productive cough, no dry cough, no bronchitis, no wheezing, no hemoptysis, no asthma, no pain with inspiration or cough, no sleep apnea, no CPAP at night  GI:   no difficulty swallowing, no reflux, no frequent heartburn, no hiatal hernia, no abdominal pain, no constipation, no diarrhea, no hematochezia, no hematemesis, no melena  GU:   no dysuria,  + frequency, no urinary tract infection, no hematuria, no enlarged prostate, no kidney stones, no kidney disease  Vascular:  no pain suggestive of claudication, no pain in feet, no leg cramps, no varicose veins, no DVT, no non-healing foot ulcer  Neuro:   no stroke, no TIA's, no seizures, no headaches, no temporary blindness one eye,  no slurred speech, no peripheral neuropathy, no chronic pain, no instability of gait, no memory/cognitive dysfunction  Musculoskeletal: + arthritis, no joint swelling, no myalgias, no difficulty walking, normal mobility   Skin:   no rash, no itching, no skin infections, no pressure sores or ulcerations  Psych:   no anxiety, no depression, no nervousness, + unusual recent stress  Eyes:   no blurry vision, no floaters, no recent vision changes, + wears glasses or contacts  ENT:   no hearing loss, no loose or painful teeth, partial dentures, last saw dentist within the past year  Hematologic:  no easy bruising, no abnormal bleeding, no clotting disorder, no frequent epistaxis  Endocrine:  + diabetes, does check CBG's at home     Physical Exam:   BP (!) 142/77 (BP Location: Right Arm, Patient Position: Sitting, Cuff Size: Normal)   Pulse 91   Temp 97.7 F (36.5 C)   Resp 18   Ht 5\' 10"  (1.778 m)   Wt 214 lb (97.1 kg)   SpO2 94% Comment: ra  BMI 30.71 kg/m   General:  Mildly obese, well-appearing  HEENT:  Unremarkable   Neck:   no JVD, no bruits, no adenopathy   Chest:   clear to auscultation, symmetrical breath sounds, no wheezes, no rhonchi   CV:   RRR, grade III/VI crescendo/decrescendo  systolic murmur   Abdomen:  soft, non-tender, no  masses   Extremities:  warm, well-perfused, pulses palpable, no LE edema  Rectal/GU  Deferred  Neuro:   Grossly non-focal and symmetrical throughout  Skin:   Clean and dry, no rashes, no breakdown   Diagnostic Tests:  ECHOCARDIOGRAM REPORT       Patient Name:  KORIN HARTWELL Settles Date of Exam: 08/25/2019  Medical Rec #: [id number]     Height:    69.5 in  Accession #:  [id number]    Weight:    212.5 lb  Date of Birth: 08-18-1950    BSA:     2.130 m  Patient Age:  36 years     BP:      122/66 mmHg  Patient Gender: M         HR:      91 bpm.  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 Aortic Valve Disease,  Signs/Symptoms:Chest         Pain, Dyspnea and Dizziness/Lightheadedness; Risk  Factors:Former         Smoker. C/o worsening DOE and chest pains with exertion  that he         did not used to notice, as well as fatigue.    Sonographer:  Vella Kohler BS, RVT, RDCS  Referring Phys: [id number] TIMOTHY J GOLLAN   IMPRESSIONS    1. Left ventricular ejection fraction, by estimation, is 60 to 65%. The  left ventricle has normal function. The left ventricle has no regional  wall motion abnormalities. There is mild left ventricular hypertrophy.  Left ventricular diastolic parameters  are consistent with Grade I diastolic dysfunction (impaired relaxation).  2. Right ventricular systolic function is normal. The right ventricular  size is normal. There is normal pulmonary artery systolic pressure.  3. The mitral valve is normal in structure. No evidence of mitral valve  regurgitation. No evidence of mitral stenosis.  4. The  aortic valve is abnormal. Aortic valve regurgitation is mild.  Severe aortic valve stenosis. Aortic valve area, by VTI measures 0.84 cm.  Aortic valve mean gradient measures 52.5 mmHg.   Comparison(s): Previous Echo showed LV EF 55-60%, no RWMA, grade I  diastolic dysfunction. Moderate AS, severely calcified valves, could not  exclude bicuspid morphology, mean gradient 24 mmHg.   FINDINGS  Left Ventricle: Left ventricular ejection fraction, by estimation, is 60  to 65%. The left ventricle has normal function. The left ventricle has no  regional wall motion abnormalities. The left ventricular internal cavity  size was normal in size. There is  mild left ventricular hypertrophy. Left ventricular diastolic parameters  are consistent with Grade I diastolic dysfunction (impaired relaxation).   Right Ventricle: The right ventricular size is normal. No increase in  right ventricular wall thickness. Right ventricular systolic function is  normal. There is normal pulmonary artery systolic pressure. The tricuspid  regurgitant velocity is 2.33 m/s, and  with an assumed right atrial pressure of 10 mmHg, the estimated right  ventricular systolic pressure is 31.7 mmHg.   Left Atrium: Left atrial size was normal in size.   Right Atrium: Right atrial size was normal in size.   Pericardium: There is no evidence of pericardial effusion.   Mitral Valve: The mitral valve is normal in structure. Normal mobility of  the mitral valve leaflets. Moderate mitral annular calcification. No  evidence of mitral valve regurgitation. No evidence of mitral valve  stenosis.   Tricuspid Valve: The tricuspid valve is normal in structure. Tricuspid  valve regurgitation is trivial. No evidence of tricuspid stenosis.   Aortic Valve: The aortic valve is abnormal. . There is severe thickening  and severe calcifcation of the aortic valve. Aortic valve regurgitation is  mild. Aortic regurgitation PHT measures 333  msec. Severe aortic stenosis  is present. There is severe  thickening of the aortic valve. There is severe calcifcation of the aortic  valve. Aortic valve mean gradient measures 52.5 mmHg. Aortic valve peak  gradient measures 76.7 mmHg. Aortic valve area, by VTI measures 0.84 cm.   Pulmonic Valve: The pulmonic valve was normal in structure. Pulmonic valve  regurgitation is mild. No evidence of pulmonic stenosis.   Aorta: The aortic root is normal in size and structure.   Venous: The inferior vena cava was not well visualized.   IAS/Shunts: No atrial level shunt detected by color flow Doppler.     LEFT VENTRICLE  PLAX 2D  LVIDd:     5.80 cm   Diastology  LVIDs:     3.60 cm   LV e' lateral:  6.96 cm/s  LV PW:     1.10 cm   LV E/e' lateral: 14.1  LV IVS:    1.10 cm   LV e' medial:  5.00 cm/s  LVOT diam:   2.00 cm   LV E/e' medial: 19.6  LV SV:     88  LV SV Index:  41  LVOT Area:   3.14 cm                 3D Volume EF:  LV Volumes (MOD)      3D EF:    57 %  LV vol d, MOD A2C: 69.0 ml LV EDV:    161 ml  LV vol d, MOD A4C: 106.0 ml LV ESV:    70 ml  LV vol s, MOD A2C: 49.8 ml LV SV:    91 ml  LV vol s, MOD A4C: 37.0 ml  LV SV MOD A2C:   19.2 ml  LV SV MOD A4C:   106.0 ml  LV SV MOD BP:   48.2 ml   RIGHT VENTRICLE  RV Basal diam: 3.80 cm  RV Mid diam:  2.00 cm  RV S prime:   15.60 cm/s  TAPSE (M-mode): 2.0 cm   LEFT ATRIUM       Index    RIGHT ATRIUM      Index  LA diam:    4.30 cm 2.02 cm/m RA Area:   15.30 cm  LA Vol (A2C):  49.6 ml 23.28 ml/m RA Volume:  37.50 ml 17.60 ml/m  LA Vol (A4C):  45.9 ml 21.54 ml/m  LA Biplane Vol: 48.0 ml 22.53 ml/m  AORTIC VALVE          PULMONIC VALVE  AV Area (Vmax):  0.91 cm   PV Vmax:    1.16 m/s  AV Area (Vmean):  0.79 cm   PV Vmean:   95.200 cm/s  AV Area (VTI):   0.84 cm   PV  VTI:    0.258 m  AV Vmax:      438.00 cm/s PV Peak grad: 5.4 mmHg  AV Vmean:     347.500 cm/s PV Mean grad: 4.0 mmHg  AV VTI:  1.055 m  AV Peak Grad:   76.7 mmHg  AV Mean Grad:   52.5 mmHg  LVOT Vmax:     127.00 cm/s  LVOT Vmean:    87.100 cm/s  LVOT VTI:     0.281 m  LVOT/AV VTI ratio: 0.27  AI PHT:      333 msec    AORTA  Ao Root diam: 3.30 cm  Ao Asc diam: 3.10 cm   MITRAL VALVE        TRICUSPID VALVE  MV Area (PHT): 3.77 cm   TR Peak grad:  21.7 mmHg  MV Decel Time: 201 msec   TR Vmax:    233.00 cm/s  MV E velocity: 98.20 cm/s  MV A velocity: 148.00 cm/s SHUNTS  MV E/A ratio: 0.66     Systemic VTI: 0.28 m               Systemic Diam: 2.00 cm   Lorine Bears MD  Electronically signed by Lorine Bears MD  Signature Date/Time: 08/26/2019/5:38:25 PM      RIGHT/LEFT HEART CATH AND CORONARY ANGIOGRAPHY  Conclusion   Mid LAD lesion is 75% stenosed.  Ost LM lesion is 40% stenosed.  Ost LAD to Prox LAD lesion is 40% stenosed.  The left ventricular ejection fraction is 55-65% by visual estimate.  LV end diastolic pressure is normal.  The left ventricular systolic function is normal.  There is no mitral valve regurgitation.  There is severe aortic valve stenosis.   Indications  Aortic stenosis, moderate [I35.0 (ICD-10-CM)]  Procedural Details  Technical Details Cardiac Catheterization Procedure Note  Name: Chase Saunders MRN: [id number] DOB: Nov 13, 1950  Procedure: Left Heart Cath, Selective Coronary Angiography, LV angiography, right heart catheterization  Indication:   69 year old male  with history of smoking for 20-25 years who stopped 20 years ago,  history of diabetes,  obesity,  hypertension,  moderate aortic valve stenosis in 2015, normal ejection fraction Severe aortic valve stenosis on echocardiogram in 2021 Worsening chest discomfort, shortness of  breath symptoms on exertion over the past several weeks to months Presented for cardiac catheterization, preop evaluation for aortic valve surgery  Procedural details: The right groin was prepped, draped, and anesthetized with 1% lidocaine. Using modified Seldinger technique, a 5 French sheath was introduced into the right femoral artery.  7 French sheath placed into right femoral vein . standard Judkins catheters (JL 4, JR 4 and pigtail catheter) were used for coronary angiography and left ventriculography.  Swan-Ganz catheter used to obtain right heart pressures . catheter exchanges were performed over a guidewire. There were no immediate procedural complications. The patient was transferred to the post catheterization recovery area for further monitoring.  Moderate sedation: 1. Sedation used: 1 mg Versed, 25 mcg fentanyl 2. Time of administration:       9:45 AM     Time patient left for recovery 10:30 AM Total sedation time 45 minutes 3. I was Face to Face with the patient during this time: (code: [id number])   Procedural Findings:  Hemodynamics:     AO 113/64    LV 169/6/18   Coronary angiography:  Coronary dominance: Right  Left mainstem:   Large vessel that bifurcates into the LAD and left circumflex, mild ostial left main disease, select images with ventricularization of waveform on engagement of the left main  Left anterior descending (LAD):   Large vessel that extends to the apical region, diagonal branch 2 of moderate size, severe proximal to mid LAD disease  Left circumflex (LCx):  Large vessel with OM branch 2, no significant disease noted  Right coronary artery (RCA):  Right dominant vessel with PL and PDA, no significant disease noted  Left ventriculography: Left ventricular systolic function is normal, LVEF is estimated at 55-65%, there is no significant mitral regurgitation ,  Severe aortic valve stenosis, mean gradient estimated 56 mmHg LV pressure 169/6/18 Ao pressure  on pullback 113/64  Right heart catheterization RA 10/7/5 RV 33/4/9 PA 28/11/18 Pulmonary capillary wedge pressure 10/5/4 Cardiac output 12.39, cardiac index 5.78    Final Conclusions:   Single-vessel disease of the LAD Otherwise mild luminal irregularities noted of the left circumflex and RCA system Severe aortic valve stenosis mean gradient close to 60 mmHg   Recommendations:  Referral to CT surgery for consideration of aortic valve replacement, LIMA to the LAD Given worsening unstable angina symptoms, a hybrid approach may be less beneficial though worth a discussion  Julien Nordmann 09/19/2019, 10:27 AM  Estimated blood loss <50 mL.   During this procedure medications were administered to achieve and maintain moderate conscious sedation while the patient's heart rate, blood pressure, and oxygen saturation were continuously monitored and I was present face-to-face 100% of this time.  Medications (Filter: Administrations occurring from 0840 to 1026 on 09/19/19) (important) Continuous medications are totaled by the amount administered until 09/19/19 1026.  fentaNYL (SUBLIMAZE) injection (mcg) Total dose:  25 mcg Date/Time  Rate/Dose/Volume Action  09/19/19 0900  25 mcg Given    midazolam (VERSED) injection (mg) Total dose:  1 mg Date/Time  Rate/Dose/Volume Action  09/19/19 0900  1 mg Given    lidocaine (PF) (XYLOCAINE) 1 % injection (mL) Total volume:  30 mL Date/Time  Rate/Dose/Volume Action  09/19/19 0908  30 mL Given    iohexol (OMNIPAQUE) 300 MG/ML solution (mL) Total volume:  130 mL Date/Time  Rate/Dose/Volume Action  09/19/19 1009  130 mL Given    Heparin (Porcine) in NaCl 1000-0.9 UT/500ML-% SOLN (mL) Total volume:  500 mL Date/Time  Rate/Dose/Volume Action  09/19/19 1009  500 mL Given    Sedation Time  Sedation Time Physician-1: 1 hour 10 minutes 16 seconds  Contrast  Medication Name Total Dose  iohexol (OMNIPAQUE) 300 MG/ML solution 130 mL      Radiation/Fluoro  Fluoro time: 10.5 (min) DAP: 40.3 (Gycm2) Cumulative Air Kerma: 470 (mGy)  Coronary Findings  Diagnostic Dominance: Right Left Main  Ost LM lesion is 40% stenosed.  Left Anterior Descending  Ost LAD to Prox LAD lesion is 40% stenosed.  Mid LAD lesion is 75% stenosed.  Intervention  No interventions have been documented. Wall Motion  Resting               Left Heart  Left Ventricle The left ventricular size is normal. The left ventricular systolic function is normal. LV end diastolic pressure is normal. The left ventricular ejection fraction is 55-65% by visual estimate. No regional wall motion abnormalities. There is no evidence of mitral regurgitation.  Aortic Valve There is severe aortic valve stenosis. The aortic valve is calcified. There is restricted aortic valve motion.  Coronary Diagrams  Diagnostic Dominance: Right  Intervention  Implants   Vascular Products  Device Closure Mynxgrip 44f - [id number] - Implanted Inventory item: DEVICE CLOSURE MYNXGRIP 88F Model/Cat number: [id number]  Manufacturer: [employer] Lot number: [id number]  Device identifier: [id number] Device identifier type: GS1  Area Of Implantation: Groin    GUDID Information  Request status Successful    Brand name: [facility]  Version/Model: [id number]  Company name: [employer] MRI safety info as of 09/19/19: MR Safe  Contains dry or latex rubber: No    GMDN P.T. name: Wound hydrogel dressing, non-antimicrobial    As of 09/19/2019  Status: Implanted      Syngo Images  Show images for CARDIAC CATHETERIZATION Images on Long Term Storage  Show images for Rayder, Sullenger to Procedure Log  Procedure Log    Hemo Data (last day) before discharge   AO Systolic Cath Pressure  AO Diastolic Cath Pressure  AO Mean Cath Pressure  LV Systolic Cath Pressure  LV End Diastolic  LV Systolic  LV End Diastolic  LV dP/dt  PA Systolic Cath Pressure  PA Diastolic Cath  Pressure  PA Mean Cath Pressure  RA Wedge A Wave  RA Wedge V Wave  RV Systolic Cath Pressure  RV Diastolic Cath Pressure  RV End Diastolic  RV Systolic  RV End Diastolic  RV dP/dt  PCW A Wave  PCW V Wave  PCW Mean  AO O2 Sat  PA O2 Sat  AO O2 Sat  Fick C.O.  Fick C.I.   --  --  --  --  --  169 mmHg  18 mmHg  1392 mmHg/sec  --  --  --  10 mmHg  7 mmHg  --  --  --  33 mmHg  9 mmHg  480 mmHg/sec  10 mmHg  5 mmHg  4 mmHg  --  --  --  12.39 L/min  5.78 L/min/m2   --  --  --  --  --  --  --  --  28 mmHg  11 mmHg  18 mmHg  --  --  --  --  --  --  --  --  --  --  --  --  --  --  --  --   --  --  --  --  --  --  --  --  --  --  --  --  --  --  --  --  --  --  --  --  --  --  96.5 %  --  SA  --  --   --  --  --  --  --  --  --  --  --  --  --  --  --  --  --  --  --  --  --  --  --  --  --  MV  --  --  --   --  --  --  --  --  --  --  --  --  --  --  --  --  33 mmHg  4 mmHg  9 mmHg  --  --  --  --  --  --  --  --  --  --  --   105  55 mmHg  77 mmHg  --  --  --  --  --  --  --  --  --  --  --  --  --  --  --  --  --  --  --  --  --  --  --  --   --  --  --  166 mmHg  18 mmHg  --  --  --  --  --  --  --  --  --  --  --  --  --  --  --  --  --  --  --  --  --  --   --  --  --  164 mmHg  20 mmHg  --  --  --  --  --  --  --  --  --  --  --  --  --  --  --  --  --  --  --  --  --  --   --  --  --  169 mmHg  18 mmHg  --  --  --  --  --  --  --  --  --  --  --  --  --  --  --  --  --  --  --  --  --  --   113  64 mmHg  86 mmHg  --  --  --  --  --                                            EKG: NSR w/out acute ischemic changes or significant AV conduction delay (09/19/2019)    Impression:  Patient has stage D severe symptomatic aortic stenosis as well as severe coronary artery disease with accelerating symptoms of exertional chest pain and shortness of breath consistent with angina pectoris and chronic diastolic congestive heart failure, New York Heart Association functional class II.  I have personally reviewed  the patient's recent transthoracic echocardiogram and diagnostic cardiac catheterization.  Echocardiogram reveals the presence of normal left ventricular systolic function with severe aortic stenosis.  The aortic valve is likely bicuspid (Sievers type I).  There is severe aortic stenosis with severely restricted leaflet mobility with moderately severe thickening of both leaflets.  Peak velocity across aortic valve measured greater than 4.3 m/s corresponding to mean transvalvular gradient estimated greater than 50 mmHg.  Mean transvalvular gradients performed at diagnostic cardiac catheterization were greater than 50 mmHg as well.  Catheterization also revealed the presence of severe coronary artery disease with long segment high-grade stenosis of the mid left anterior descending coronary artery.  There is also some ostial stenosis of the left main coronary artery with ostial stenosis of the left anterior descending coronary artery which I feel may be hemodynamically significant.  Right heart pressures were mildly elevated.  I feel the patient would best be treated with combined aortic valve replacement and coronary artery bypass grafting using conventional surgical techniques.  I am concerned by the presence of significant left main and ostial left anterior descending coronary artery stenosis.  Moreover, the long segment stenosis of the mid left anterior descending coronary artery might not be well suited to PCI and stenting.  Given the patient's relatively young age, the presence of a bicuspid aortic valve, and anatomical concerns related to the possible use of PCI for treating his coronary artery disease, I cannot recommend PCI and stenting with transcatheter aortic valve replacement as an alternative to conventional surgery.   Plan:  The patient and his wife were counseled at length regarding treatment alternatives for management of severe symptomatic aortic stenosis and coronary artery disease.  Alternative approaches such as conventional aortic valve replacement with coronary artery bypass grafting, transcatheter aortic valve replacement with or without PCI and stenting of the LAD, and continued medical therapy without intervention were compared and contrasted at length.  The risks associated with conventional surgery were discussed in detail, as were expectations for post-operative convalescence, alternative surgical approaches, prosthetic valve choices, and the presence of significant CAD which needs intervention.  Issues specific to transcatheter aortic  valve replacement were discussed including questions about long term valve durability, the potential for paravalvular leak, possible increased risk of need for permanent pacemaker placement, the suitability of the patient's surgical anatomy, and other potential technical complications related to the procedure itself.  Long-term prognosis without surgical intervention was discussed.  All treatment options were discussed in the context of the patient's own specific clinical presentation, past medical history, long-term prognosis and life expectancy.  All of their questions have been addressed.  The patient desires to proceed with aortic valve replacement and coronary artery bypass grafting as soon as possible.  Discussion was held comparing the relative risks of mechanical valve replacement with need for lifelong anticoagulation versus use of a bioprosthetic tissue valve and the associated potential for late structural valve deterioration and failure.  This discussion was placed in the context of the patient's particular circumstances, and as a result the patient specifically requests that their valve be replaced using a bioprosthetic tissue valve. The patient understands and accepts all potential associated risks of surgery including but not limited to risk of death, stroke, myocardial infarction, congestive heart failure, respiratory failure, renal  failure, pneumonia, bleeding requiring blood transfusion and or reexploration, arrhythmia, heart block or bradycardia requiring permanent pacemaker, aortic dissection or other major vascular complication, pleural effusions or other delayed complications related to continued congestive heart failure, other late complications related to valve replacement including structural valve deterioration and failure, thrombosis, endocarditis, or paravalvular leak, as well as late recurrence of symptomatic ischemic heart disease.  The importance of long-term risk factor modification have been discussed.  We tentatively plan for surgery on September 24, 2019.  Prior to surgery the patient will undergo noncontrast CT scan of the chest to screen for possible aneurysmal involvement of the ascending thoracic aorta given the patient's history of bicuspid aortic valve.    I spent in excess of 90 minutes during the conduct of this office consultation and >50% of this time involved direct face-to-face encounter with the patient for counseling and/or coordination of their care.    Salvatore Decent. Cornelius Moras, MD 09/22/2019 12:58 PM

## 2019-09-23 ENCOUNTER — Ambulatory Visit
Admission: RE | Admit: 2019-09-23 | Discharge: 2019-09-23 | Disposition: A | Discharge status: Home / Self Care | Payer: Medicare HMO | Source: Ambulatory Visit | Attending: Thoracic Surgery (Cardiothoracic Vascular Surgery) | Admitting: Thoracic Surgery (Cardiothoracic Vascular Surgery)

## 2019-09-23 ENCOUNTER — Ambulatory Visit (HOSPITAL_BASED_OUTPATIENT_CLINIC_OR_DEPARTMENT_OTHER)
Admission: RE | Admit: 2019-09-23 | Discharge: 2019-09-23 | Disposition: A | Discharge status: Home / Self Care | Payer: Medicare HMO | Source: Ambulatory Visit | Attending: Thoracic Surgery (Cardiothoracic Vascular Surgery) | Admitting: Thoracic Surgery (Cardiothoracic Vascular Surgery)

## 2019-09-23 DIAGNOSIS — I2510 Atherosclerotic heart disease of native coronary artery without angina pectoris: Secondary | ICD-10-CM

## 2019-09-23 DIAGNOSIS — I350 Nonrheumatic aortic (valve) stenosis: Secondary | ICD-10-CM

## 2019-09-23 DIAGNOSIS — R918 Other nonspecific abnormal finding of lung field: Secondary | ICD-10-CM | POA: Diagnosis not present

## 2019-09-23 DIAGNOSIS — Z01818 Encounter for other preprocedural examination: Secondary | ICD-10-CM | POA: Diagnosis not present

## 2019-09-23 MED ORDER — VANCOMYCIN HCL 1500 MG/300ML IV SOLN
1500.0000 mg | INTRAVENOUS | Status: AC
  Administered 2019-09-24: 1500 mg via INTRAVENOUS
  Filled 2019-09-23: qty 300

## 2019-09-23 MED ORDER — SODIUM CHLORIDE 0.9 % IV SOLN
1.5000 g | INTRAVENOUS | Status: AC
  Administered 2019-09-24: 1.5 g via INTRAVENOUS
  Filled 2019-09-23: qty 1.5

## 2019-09-23 MED ORDER — TRANEXAMIC ACID 1000 MG/10ML IV SOLN
1.5000 mg/kg/h | INTRAVENOUS | Status: AC
  Administered 2019-09-24: 1.5 mg/kg/h via INTRAVENOUS
  Filled 2019-09-23: qty 25

## 2019-09-23 MED ORDER — VANCOMYCIN HCL 1000 MG IV SOLR
INTRAVENOUS | Status: DC
  Filled 2019-09-23: qty 1000

## 2019-09-23 MED ORDER — TRANEXAMIC ACID (OHS) PUMP PRIME SOLUTION
2.0000 mg/kg | INTRAVENOUS | Status: DC
  Filled 2019-09-23: qty 1.91

## 2019-09-23 MED ORDER — PHENYLEPHRINE HCL-NACL 20-0.9 MG/250ML-% IV SOLN
30.0000 ug/min | INTRAVENOUS | Status: AC
  Administered 2019-09-24: 25 ug/min via INTRAVENOUS
  Filled 2019-09-23: qty 250

## 2019-09-23 MED ORDER — INSULIN REGULAR(HUMAN) IN NACL 100-0.9 UT/100ML-% IV SOLN
INTRAVENOUS | Status: AC
  Administered 2019-09-24: 5.5 [IU]/h via INTRAVENOUS
  Filled 2019-09-23: qty 100

## 2019-09-23 MED ORDER — NOREPINEPHRINE 4 MG/250ML-% IV SOLN
0.0000 ug/min | INTRAVENOUS | Status: DC
  Filled 2019-09-23: qty 250

## 2019-09-23 MED ORDER — MANNITOL 20 % IV SOLN
INTRAVENOUS | Status: DC
  Filled 2019-09-23: qty 13

## 2019-09-23 MED ORDER — POTASSIUM CHLORIDE 2 MEQ/ML IV SOLN
80.0000 meq | INTRAVENOUS | Status: DC
  Filled 2019-09-23: qty 40

## 2019-09-23 MED ORDER — TRANEXAMIC ACID (OHS) BOLUS VIA INFUSION
15.0000 mg/kg | INTRAVENOUS | Status: AC
  Administered 2019-09-24: 1435.5 mg via INTRAVENOUS
  Filled 2019-09-23: qty 1436

## 2019-09-23 MED ORDER — NITROGLYCERIN IN D5W 200-5 MCG/ML-% IV SOLN
2.0000 ug/min | INTRAVENOUS | Status: DC
  Filled 2019-09-23: qty 250

## 2019-09-23 MED ORDER — PLASMA-LYTE 148 IV SOLN
INTRAVENOUS | Status: DC
  Filled 2019-09-23: qty 2.5

## 2019-09-23 MED ORDER — SODIUM CHLORIDE 0.9 % IV SOLN
750.0000 mg | INTRAVENOUS | Status: AC
  Administered 2019-09-24: 750 mg via INTRAVENOUS
  Filled 2019-09-23: qty 750

## 2019-09-23 MED ORDER — SODIUM CHLORIDE 0.9 % IV SOLN
INTRAVENOUS | Status: DC
  Filled 2019-09-23: qty 30

## 2019-09-23 MED ORDER — MILRINONE LACTATE IN DEXTROSE 20-5 MG/100ML-% IV SOLN
0.3000 ug/kg/min | INTRAVENOUS | Status: DC
  Filled 2019-09-23: qty 100

## 2019-09-23 MED ORDER — DEXMEDETOMIDINE HCL IN NACL 400 MCG/100ML IV SOLN
0.1000 ug/kg/h | INTRAVENOUS | Status: AC
  Administered 2019-09-24: .3 ug/kg/h via INTRAVENOUS
  Filled 2019-09-23: qty 100

## 2019-09-23 MED ORDER — EPINEPHRINE HCL 5 MG/250ML IV SOLN IN NS
0.0000 ug/min | INTRAVENOUS | Status: DC
  Filled 2019-09-23: qty 250

## 2019-09-23 NOTE — Progress Notes (Signed)
Pre-CABG Dopplers completed. Refer to "CV Proc" under chart review to view preliminary results.  09/23/2019 2:02 PM Eula Fried., MHA, RVT, RDCS, RDMS

## 2019-09-24 ENCOUNTER — Inpatient Hospital Stay (HOSPITAL_COMMUNITY): Payer: Medicare HMO

## 2019-09-24 ENCOUNTER — Encounter (HOSPITAL_COMMUNITY): Payer: Self-pay | Admitting: Thoracic Surgery (Cardiothoracic Vascular Surgery)

## 2019-09-24 ENCOUNTER — Other Ambulatory Visit: Payer: Self-pay

## 2019-09-24 ENCOUNTER — Inpatient Hospital Stay (HOSPITAL_COMMUNITY)
Admission: RE | Disposition: A | Discharge status: Home / Self Care | Payer: Self-pay | Source: Home / Self Care | Attending: Thoracic Surgery (Cardiothoracic Vascular Surgery)

## 2019-09-24 ENCOUNTER — Inpatient Hospital Stay (HOSPITAL_COMMUNITY)
Admission: RE | Admit: 2019-09-24 | Discharge: 2019-09-29 | DRG: 220 | Disposition: A | Discharge status: Home / Self Care | Payer: Medicare HMO | Attending: Thoracic Surgery (Cardiothoracic Vascular Surgery) | Admitting: Thoracic Surgery (Cardiothoracic Vascular Surgery)

## 2019-09-24 DIAGNOSIS — Z20822 Contact with and (suspected) exposure to covid-19: Secondary | ICD-10-CM | POA: Diagnosis present

## 2019-09-24 DIAGNOSIS — Z951 Presence of aortocoronary bypass graft: Secondary | ICD-10-CM

## 2019-09-24 DIAGNOSIS — Z833 Family history of diabetes mellitus: Secondary | ICD-10-CM | POA: Diagnosis not present

## 2019-09-24 DIAGNOSIS — I25110 Atherosclerotic heart disease of native coronary artery with unstable angina pectoris: Secondary | ICD-10-CM | POA: Diagnosis present

## 2019-09-24 DIAGNOSIS — Z87891 Personal history of nicotine dependence: Secondary | ICD-10-CM

## 2019-09-24 DIAGNOSIS — E669 Obesity, unspecified: Secondary | ICD-10-CM | POA: Diagnosis present

## 2019-09-24 DIAGNOSIS — I110 Hypertensive heart disease with heart failure: Secondary | ICD-10-CM | POA: Diagnosis present

## 2019-09-24 DIAGNOSIS — E782 Mixed hyperlipidemia: Secondary | ICD-10-CM | POA: Diagnosis present

## 2019-09-24 DIAGNOSIS — Z794 Long term (current) use of insulin: Secondary | ICD-10-CM

## 2019-09-24 DIAGNOSIS — Z8249 Family history of ischemic heart disease and other diseases of the circulatory system: Secondary | ICD-10-CM

## 2019-09-24 DIAGNOSIS — Z79899 Other long term (current) drug therapy: Secondary | ICD-10-CM

## 2019-09-24 DIAGNOSIS — E1165 Type 2 diabetes mellitus with hyperglycemia: Secondary | ICD-10-CM

## 2019-09-24 DIAGNOSIS — E119 Type 2 diabetes mellitus without complications: Secondary | ICD-10-CM | POA: Diagnosis present

## 2019-09-24 DIAGNOSIS — Z6830 Body mass index (BMI) 30.0-30.9, adult: Secondary | ICD-10-CM | POA: Diagnosis not present

## 2019-09-24 DIAGNOSIS — I350 Nonrheumatic aortic (valve) stenosis: Secondary | ICD-10-CM

## 2019-09-24 DIAGNOSIS — Z7982 Long term (current) use of aspirin: Secondary | ICD-10-CM | POA: Diagnosis not present

## 2019-09-24 DIAGNOSIS — D62 Acute posthemorrhagic anemia: Secondary | ICD-10-CM | POA: Diagnosis not present

## 2019-09-24 DIAGNOSIS — I2510 Atherosclerotic heart disease of native coronary artery without angina pectoris: Secondary | ICD-10-CM

## 2019-09-24 DIAGNOSIS — I5032 Chronic diastolic (congestive) heart failure: Secondary | ICD-10-CM | POA: Diagnosis present

## 2019-09-24 DIAGNOSIS — J9811 Atelectasis: Secondary | ICD-10-CM

## 2019-09-24 DIAGNOSIS — Z953 Presence of xenogenic heart valve: Secondary | ICD-10-CM

## 2019-09-24 DIAGNOSIS — I200 Unstable angina: Secondary | ICD-10-CM | POA: Diagnosis present

## 2019-09-24 DIAGNOSIS — I10 Essential (primary) hypertension: Secondary | ICD-10-CM | POA: Diagnosis present

## 2019-09-24 HISTORY — PX: CORONARY ARTERY BYPASS GRAFT: SHX141

## 2019-09-24 HISTORY — PX: ENDOVEIN HARVEST OF GREATER SAPHENOUS VEIN: SHX5059

## 2019-09-24 HISTORY — DX: Presence of xenogenic heart valve: Z95.3

## 2019-09-24 HISTORY — PX: TEE WITHOUT CARDIOVERSION: SHX5443

## 2019-09-24 HISTORY — DX: Presence of aortocoronary bypass graft: Z95.1

## 2019-09-24 HISTORY — PX: AORTIC VALVE REPLACEMENT: SHX41

## 2019-09-24 LAB — MAGNESIUM: Magnesium: 3 mg/dL — ABNORMAL HIGH (ref 1.7–2.4)

## 2019-09-24 LAB — POCT I-STAT, CHEM 8
BUN: 10 mg/dL (ref 8–23)
BUN: 9 mg/dL (ref 8–23)
BUN: 9 mg/dL (ref 8–23)
BUN: 9 mg/dL (ref 8–23)
BUN: 8 mg/dL (ref 8–23)
Calcium, Ion: 1.22 mmol/L (ref 1.15–1.40)
Calcium, Ion: 1.17 mmol/L (ref 1.15–1.40)
Calcium, Ion: 0.99 mmol/L — ABNORMAL LOW (ref 1.15–1.40)
Calcium, Ion: 1.05 mmol/L — ABNORMAL LOW (ref 1.15–1.40)
Calcium, Ion: 0.99 mmol/L — ABNORMAL LOW (ref 1.15–1.40)
Chloride: 101 mmol/L (ref 98–111)
Chloride: 102 mmol/L (ref 98–111)
Chloride: 100 mmol/L (ref 98–111)
Chloride: 101 mmol/L (ref 98–111)
Chloride: 102 mmol/L (ref 98–111)
Creatinine, Ser: 0.7 mg/dL (ref 0.61–1.24)
Creatinine, Ser: 0.7 mg/dL (ref 0.61–1.24)
Creatinine, Ser: 0.6 mg/dL — ABNORMAL LOW (ref 0.61–1.24)
Creatinine, Ser: 0.7 mg/dL (ref 0.61–1.24)
Creatinine, Ser: 0.6 mg/dL — ABNORMAL LOW (ref 0.61–1.24)
Glucose, Bld: 169 mg/dL — ABNORMAL HIGH (ref 70–99)
Glucose, Bld: 153 mg/dL — ABNORMAL HIGH (ref 70–99)
Glucose, Bld: 81 mg/dL (ref 70–99)
Glucose, Bld: 90 mg/dL (ref 70–99)
Glucose, Bld: 127 mg/dL — ABNORMAL HIGH (ref 70–99)
HCT: 36 % — ABNORMAL LOW (ref 39.0–52.0)
HCT: 33 % — ABNORMAL LOW (ref 39.0–52.0)
HCT: 28 % — ABNORMAL LOW (ref 39.0–52.0)
HCT: 29 % — ABNORMAL LOW (ref 39.0–52.0)
HCT: 27 % — ABNORMAL LOW (ref 39.0–52.0)
Hemoglobin: 12.2 g/dL — ABNORMAL LOW (ref 13.0–17.0)
Hemoglobin: 11.2 g/dL — ABNORMAL LOW (ref 13.0–17.0)
Hemoglobin: 9.5 g/dL — ABNORMAL LOW (ref 13.0–17.0)
Hemoglobin: 9.9 g/dL — ABNORMAL LOW (ref 13.0–17.0)
Hemoglobin: 9.2 g/dL — ABNORMAL LOW (ref 13.0–17.0)
Potassium: 4 mmol/L (ref 3.5–5.1)
Potassium: 4.2 mmol/L (ref 3.5–5.1)
Potassium: 4.3 mmol/L (ref 3.5–5.1)
Potassium: 4.4 mmol/L (ref 3.5–5.1)
Potassium: 3.7 mmol/L (ref 3.5–5.1)
Sodium: 142 mmol/L (ref 135–145)
Sodium: 142 mmol/L (ref 135–145)
Sodium: 140 mmol/L (ref 135–145)
Sodium: 141 mmol/L (ref 135–145)
Sodium: 142 mmol/L (ref 135–145)
TCO2: 30 mmol/L (ref 22–32)
TCO2: 30 mmol/L (ref 22–32)
TCO2: 27 mmol/L (ref 22–32)
TCO2: 28 mmol/L (ref 22–32)
TCO2: 27 mmol/L (ref 22–32)

## 2019-09-24 LAB — POCT I-STAT 7, (LYTES, BLD GAS, ICA,H+H)
Acid-Base Excess: 5 mmol/L — ABNORMAL HIGH (ref 0.0–2.0)
Acid-Base Excess: 4 mmol/L — ABNORMAL HIGH (ref 0.0–2.0)
Acid-Base Excess: 4 mmol/L — ABNORMAL HIGH (ref 0.0–2.0)
Acid-Base Excess: 0 mmol/L (ref 0.0–2.0)
Acid-base deficit: 1 mmol/L (ref 0.0–2.0)
Acid-base deficit: 7 mmol/L — ABNORMAL HIGH (ref 0.0–2.0)
Bicarbonate: 31 mmol/L — ABNORMAL HIGH (ref 20.0–28.0)
Bicarbonate: 28.9 mmol/L — ABNORMAL HIGH (ref 20.0–28.0)
Bicarbonate: 28.1 mmol/L — ABNORMAL HIGH (ref 20.0–28.0)
Bicarbonate: 26.3 mmol/L (ref 20.0–28.0)
Bicarbonate: 25.1 mmol/L (ref 20.0–28.0)
Bicarbonate: 19.1 mmol/L — ABNORMAL LOW (ref 20.0–28.0)
Calcium, Ion: 1.05 mmol/L — ABNORMAL LOW (ref 1.15–1.40)
Calcium, Ion: 1.01 mmol/L — ABNORMAL LOW (ref 1.15–1.40)
Calcium, Ion: 0.98 mmol/L — ABNORMAL LOW (ref 1.15–1.40)
Calcium, Ion: 1.04 mmol/L — ABNORMAL LOW (ref 1.15–1.40)
Calcium, Ion: 1.04 mmol/L — ABNORMAL LOW (ref 1.15–1.40)
Calcium, Ion: 0.99 mmol/L — ABNORMAL LOW (ref 1.15–1.40)
HCT: 28 % — ABNORMAL LOW (ref 39.0–52.0)
HCT: 27 % — ABNORMAL LOW (ref 39.0–52.0)
HCT: 26 % — ABNORMAL LOW (ref 39.0–52.0)
HCT: 29 % — ABNORMAL LOW (ref 39.0–52.0)
HCT: 29 % — ABNORMAL LOW (ref 39.0–52.0)
HCT: 26 % — ABNORMAL LOW (ref 39.0–52.0)
Hemoglobin: 9.5 g/dL — ABNORMAL LOW (ref 13.0–17.0)
Hemoglobin: 9.2 g/dL — ABNORMAL LOW (ref 13.0–17.0)
Hemoglobin: 8.8 g/dL — ABNORMAL LOW (ref 13.0–17.0)
Hemoglobin: 9.9 g/dL — ABNORMAL LOW (ref 13.0–17.0)
Hemoglobin: 9.9 g/dL — ABNORMAL LOW (ref 13.0–17.0)
Hemoglobin: 8.8 g/dL — ABNORMAL LOW (ref 13.0–17.0)
O2 Saturation: 100 %
O2 Saturation: 100 %
O2 Saturation: 100 %
O2 Saturation: 96 %
O2 Saturation: 94 %
O2 Saturation: 93 %
Patient temperature: 36.6
Patient temperature: 37.8
Patient temperature: 38
Potassium: 4 mmol/L (ref 3.5–5.1)
Potassium: 4.2 mmol/L (ref 3.5–5.1)
Potassium: 4.3 mmol/L (ref 3.5–5.1)
Potassium: 3.8 mmol/L (ref 3.5–5.1)
Potassium: 5.2 mmol/L — ABNORMAL HIGH (ref 3.5–5.1)
Potassium: 4.4 mmol/L (ref 3.5–5.1)
Sodium: 141 mmol/L (ref 135–145)
Sodium: 141 mmol/L (ref 135–145)
Sodium: 141 mmol/L (ref 135–145)
Sodium: 145 mmol/L (ref 135–145)
Sodium: 144 mmol/L (ref 135–145)
Sodium: 145 mmol/L (ref 135–145)
TCO2: 33 mmol/L — ABNORMAL HIGH (ref 22–32)
TCO2: 30 mmol/L (ref 22–32)
TCO2: 29 mmol/L (ref 22–32)
TCO2: 28 mmol/L (ref 22–32)
TCO2: 27 mmol/L (ref 22–32)
TCO2: 20 mmol/L — ABNORMAL LOW (ref 22–32)
pCO2 arterial: 51.2 mmHg — ABNORMAL HIGH (ref 32.0–48.0)
pCO2 arterial: 41.9 mmHg (ref 32.0–48.0)
pCO2 arterial: 39.1 mmHg (ref 32.0–48.0)
pCO2 arterial: 49.7 mmHg — ABNORMAL HIGH (ref 32.0–48.0)
pCO2 arterial: 51.6 mmHg — ABNORMAL HIGH (ref 32.0–48.0)
pCO2 arterial: 41.1 mmHg (ref 32.0–48.0)
pH, Arterial: 7.39 (ref 7.350–7.450)
pH, Arterial: 7.446 (ref 7.350–7.450)
pH, Arterial: 7.465 — ABNORMAL HIGH (ref 7.350–7.450)
pH, Arterial: 7.33 — ABNORMAL LOW (ref 7.350–7.450)
pH, Arterial: 7.299 — ABNORMAL LOW (ref 7.350–7.450)
pH, Arterial: 7.28 — ABNORMAL LOW (ref 7.350–7.450)
pO2, Arterial: 385 mmHg — ABNORMAL HIGH (ref 83.0–108.0)
pO2, Arterial: 331 mmHg — ABNORMAL HIGH (ref 83.0–108.0)
pO2, Arterial: 178 mmHg — ABNORMAL HIGH (ref 83.0–108.0)
pO2, Arterial: 87 mmHg (ref 83.0–108.0)
pO2, Arterial: 84 mmHg (ref 83.0–108.0)
pO2, Arterial: 79 mmHg — ABNORMAL LOW (ref 83.0–108.0)

## 2019-09-24 LAB — GLUCOSE, CAPILLARY
Glucose-Capillary: 206 mg/dL — ABNORMAL HIGH (ref 70–99)
Glucose-Capillary: 84 mg/dL (ref 70–99)
Glucose-Capillary: 103 mg/dL — ABNORMAL HIGH (ref 70–99)
Glucose-Capillary: 105 mg/dL — ABNORMAL HIGH (ref 70–99)
Glucose-Capillary: 103 mg/dL — ABNORMAL HIGH (ref 70–99)
Glucose-Capillary: 96 mg/dL (ref 70–99)
Glucose-Capillary: 128 mg/dL — ABNORMAL HIGH (ref 70–99)
Glucose-Capillary: 100 mg/dL — ABNORMAL HIGH (ref 70–99)
Glucose-Capillary: 123 mg/dL — ABNORMAL HIGH (ref 70–99)
Glucose-Capillary: 151 mg/dL — ABNORMAL HIGH (ref 70–99)

## 2019-09-24 LAB — CBC
HCT: 30.8 % — ABNORMAL LOW (ref 39.0–52.0)
HCT: 29.7 % — ABNORMAL LOW (ref 39.0–52.0)
Hemoglobin: 10.5 g/dL — ABNORMAL LOW (ref 13.0–17.0)
Hemoglobin: 10.1 g/dL — ABNORMAL LOW (ref 13.0–17.0)
MCH: 33.1 pg (ref 26.0–34.0)
MCH: 33.4 pg (ref 26.0–34.0)
MCHC: 34.1 g/dL (ref 30.0–36.0)
MCHC: 34 g/dL (ref 30.0–36.0)
MCV: 97.2 fL (ref 80.0–100.0)
MCV: 98.3 fL (ref 80.0–100.0)
Platelets: 133 10*3/uL — ABNORMAL LOW (ref 150–400)
Platelets: 146 10*3/uL — ABNORMAL LOW (ref 150–400)
RBC: 3.17 MIL/uL — ABNORMAL LOW (ref 4.22–5.81)
RBC: 3.02 MIL/uL — ABNORMAL LOW (ref 4.22–5.81)
RDW: 12.7 % (ref 11.5–15.5)
RDW: 12.9 % (ref 11.5–15.5)
WBC: 15.3 10*3/uL — ABNORMAL HIGH (ref 4.0–10.5)
WBC: 14.7 10*3/uL — ABNORMAL HIGH (ref 4.0–10.5)
nRBC: 0 % (ref 0.0–0.2)
nRBC: 0 % (ref 0.0–0.2)

## 2019-09-24 LAB — ABO/RH: ABO/RH(D): A POS

## 2019-09-24 LAB — ECHO INTRAOPERATIVE TEE
Height: 70 in
Weight: 3424 oz

## 2019-09-24 LAB — BASIC METABOLIC PANEL
Anion gap: 9 (ref 5–15)
BUN: 10 mg/dL (ref 8–23)
CO2: 20 mmol/L — ABNORMAL LOW (ref 22–32)
Calcium: 7 mg/dL — ABNORMAL LOW (ref 8.9–10.3)
Chloride: 111 mmol/L (ref 98–111)
Creatinine, Ser: 0.95 mg/dL (ref 0.61–1.24)
GFR calc Af Amer: >60 mL/min (ref >60)
GFR calc non Af Amer: >60 mL/min (ref >60)
Glucose, Bld: 114 mg/dL — ABNORMAL HIGH (ref 70–99)
Potassium: 5.1 mmol/L (ref 3.5–5.1)
Sodium: 140 mmol/L (ref 135–145)

## 2019-09-24 LAB — PROTIME-INR
INR: 1.7 — ABNORMAL HIGH (ref 0.8–1.2)
Prothrombin Time: 19 seconds — ABNORMAL HIGH (ref 11.4–15.2)

## 2019-09-24 LAB — HEMOGLOBIN AND HEMATOCRIT, BLOOD
HCT: 26.5 % — ABNORMAL LOW (ref 39.0–52.0)
Hemoglobin: 9 g/dL — ABNORMAL LOW (ref 13.0–17.0)

## 2019-09-24 LAB — APTT: aPTT: 39 seconds — ABNORMAL HIGH (ref 24–36)

## 2019-09-24 LAB — PLATELET COUNT: Platelets: 170 10*3/uL (ref 150–400)

## 2019-09-24 SURGERY — CORONARY ARTERY BYPASS GRAFTING (CABG)
Anesthesia: General | Site: Leg Upper | Laterality: Right

## 2019-09-24 MED ORDER — PHENYLEPHRINE HCL-NACL 20-0.9 MG/250ML-% IV SOLN
0.0000 ug/min | INTRAVENOUS | Status: DC
  Administered 2019-09-24: 95 ug/min via INTRAVENOUS
  Administered 2019-09-24: 90 ug/min via INTRAVENOUS
  Filled 2019-09-24 (×3): qty 250

## 2019-09-24 MED ORDER — SODIUM CHLORIDE 0.9 % IV SOLN: 250.0000 mL | INTRAVENOUS | Status: DC

## 2019-09-24 MED ORDER — PROTAMINE SULFATE 10 MG/ML IV SOLN
INTRAVENOUS | Status: DC | PRN
  Administered 2019-09-24: 240 mg via INTRAVENOUS
  Administered 2019-09-24: 30 mg via INTRAVENOUS

## 2019-09-24 MED ORDER — ROCURONIUM BROMIDE 10 MG/ML (PF) SYRINGE
PREFILLED_SYRINGE | INTRAVENOUS | Status: AC
  Filled 2019-09-24: qty 10

## 2019-09-24 MED ORDER — DEXTROSE 50 % IV SOLN: 0.0000 mL | INTRAVENOUS | Status: DC | PRN

## 2019-09-24 MED ORDER — ACETAMINOPHEN 160 MG/5ML PO SOLN: 1000.0000 mg | Freq: Four times a day (QID) | ORAL | Status: DC

## 2019-09-24 MED ORDER — MIDAZOLAM HCL 2 MG/2ML IJ SOLN: 2.0000 mg | INTRAMUSCULAR | Status: DC | PRN

## 2019-09-24 MED ORDER — NITROGLYCERIN IN D5W 200-5 MCG/ML-% IV SOLN: 0.0000 ug/min | INTRAVENOUS | Status: DC

## 2019-09-24 MED ORDER — ACETAMINOPHEN 650 MG RE SUPP
650.0000 mg | Freq: Once | RECTAL | Status: AC
  Administered 2019-09-24: 650 mg via RECTAL

## 2019-09-24 MED ORDER — MIDAZOLAM HCL (PF) 10 MG/2ML IJ SOLN
INTRAMUSCULAR | Status: AC
  Filled 2019-09-24: qty 2

## 2019-09-24 MED ORDER — MORPHINE SULFATE (PF) 2 MG/ML IV SOLN
1.0000 mg | INTRAVENOUS | Status: DC | PRN
  Administered 2019-09-24 – 2019-09-25 (×3): 2 mg via INTRAVENOUS
  Filled 2019-09-24 (×3): qty 1

## 2019-09-24 MED ORDER — SODIUM CHLORIDE 0.9 % IV SOLN
INTRAVENOUS | Status: DC
  Administered 2019-09-24: 10 mL/h via INTRAVENOUS

## 2019-09-24 MED ORDER — PANTOPRAZOLE SODIUM 40 MG PO TBEC
40.0000 mg | DELAYED_RELEASE_TABLET | Freq: Every day | ORAL | Status: DC
  Administered 2019-09-26 – 2019-09-29 (×4): 40 mg via ORAL
  Filled 2019-09-24 (×4): qty 1

## 2019-09-24 MED ORDER — LACTATED RINGERS IV SOLN: INTRAVENOUS | Status: DC

## 2019-09-24 MED ORDER — HEPARIN SODIUM (PORCINE) 1000 UNIT/ML IJ SOLN
INTRAMUSCULAR | Status: DC | PRN
  Administered 2019-09-24: 29000 [IU] via INTRAVENOUS

## 2019-09-24 MED ORDER — METOPROLOL TARTRATE 12.5 MG HALF TABLET
12.5000 mg | ORAL_TABLET | Freq: Once | ORAL | Status: AC
  Administered 2019-09-24: 12.5 mg via ORAL
  Filled 2019-09-24: qty 1

## 2019-09-24 MED ORDER — LACTATED RINGERS IV SOLN
INTRAVENOUS | Status: DC | PRN
Start: 2019-09-24 — End: 2019-09-24

## 2019-09-24 MED ORDER — SODIUM CHLORIDE 0.9% FLUSH: 3.0000 mL | INTRAVENOUS | Status: DC | PRN

## 2019-09-24 MED ORDER — METOPROLOL TARTRATE 25 MG/10 ML ORAL SUSPENSION: 12.5000 mg | Freq: Two times a day (BID) | ORAL | Status: DC

## 2019-09-24 MED ORDER — ORAL CARE MOUTH RINSE
15.0000 mL | Freq: Two times a day (BID) | OROMUCOSAL | Status: DC
  Administered 2019-09-25 – 2019-09-28 (×4): 15 mL via OROMUCOSAL

## 2019-09-24 MED ORDER — ALBUMIN HUMAN 5 % IV SOLN
INTRAVENOUS | Status: DC | PRN
Start: 2019-09-24 — End: 2019-09-24

## 2019-09-24 MED ORDER — FENTANYL CITRATE (PF) 250 MCG/5ML IJ SOLN
INTRAMUSCULAR | Status: DC | PRN
  Administered 2019-09-24: 100 ug via INTRAVENOUS
  Administered 2019-09-24: 950 ug via INTRAVENOUS
  Administered 2019-09-24: 150 ug via INTRAVENOUS
  Administered 2019-09-24: 50 ug via INTRAVENOUS

## 2019-09-24 MED ORDER — PLASMA-LYTE 148 IV SOLN
INTRAVENOUS | Status: DC | PRN
  Administered 2019-09-24: 500 mL

## 2019-09-24 MED ORDER — HEMOSTATIC AGENTS (NO CHARGE) OPTIME
TOPICAL | Status: DC | PRN
  Administered 2019-09-24 (×4): 1 via TOPICAL

## 2019-09-24 MED ORDER — CHLORHEXIDINE GLUCONATE 0.12% ORAL RINSE (MEDLINE KIT): 15.0000 mL | Freq: Two times a day (BID) | OROMUCOSAL | Status: DC

## 2019-09-24 MED ORDER — METOPROLOL TARTRATE 5 MG/5ML IV SOLN: 2.5000 mg | INTRAVENOUS | Status: DC | PRN

## 2019-09-24 MED ORDER — SODIUM CHLORIDE 0.9 % IV SOLN
1.5000 g | Freq: Two times a day (BID) | INTRAVENOUS | Status: AC
  Administered 2019-09-24 – 2019-09-26 (×4): 1.5 g via INTRAVENOUS
  Filled 2019-09-24 (×4): qty 1.5

## 2019-09-24 MED ORDER — ORAL CARE MOUTH RINSE
15.0000 mL | OROMUCOSAL | Status: DC
  Administered 2019-09-24: 15 mL via OROMUCOSAL

## 2019-09-24 MED ORDER — SODIUM CHLORIDE 0.9 % IV SOLN
INTRAVENOUS | Status: DC
  Administered 2019-09-24: 100 mL/h via INTRAVENOUS

## 2019-09-24 MED ORDER — SODIUM BICARBONATE 8.4 % IV SOLN
50.0000 meq | Freq: Once | INTRAVENOUS | Status: AC
  Administered 2019-09-24: 50 meq via INTRAVENOUS

## 2019-09-24 MED ORDER — DEXMEDETOMIDINE HCL IN NACL 400 MCG/100ML IV SOLN
0.0000 ug/kg/h | INTRAVENOUS | Status: DC
  Administered 2019-09-24: 0.5 ug/kg/h via INTRAVENOUS
  Filled 2019-09-24: qty 100

## 2019-09-24 MED ORDER — HEPARIN SODIUM (PORCINE) 1000 UNIT/ML IJ SOLN
INTRAMUSCULAR | Status: AC
  Filled 2019-09-24: qty 1

## 2019-09-24 MED ORDER — ONDANSETRON HCL 4 MG/2ML IJ SOLN: 4.0000 mg | Freq: Four times a day (QID) | INTRAMUSCULAR | Status: DC | PRN

## 2019-09-24 MED ORDER — BISACODYL 10 MG RE SUPP
10.0000 mg | Freq: Every day | RECTAL | Status: DC
  Filled 2019-09-24: qty 1

## 2019-09-24 MED ORDER — CHLORHEXIDINE GLUCONATE 0.12 % MT SOLN
15.0000 mL | OROMUCOSAL | Status: AC
  Administered 2019-09-24: 15 mL via OROMUCOSAL

## 2019-09-24 MED ORDER — VANCOMYCIN HCL IN DEXTROSE 1-5 GM/200ML-% IV SOLN
1000.0000 mg | Freq: Once | INTRAVENOUS | Status: AC
  Administered 2019-09-24: 1000 mg via INTRAVENOUS
  Filled 2019-09-24: qty 200

## 2019-09-24 MED ORDER — SODIUM CHLORIDE 0.9% FLUSH
3.0000 mL | Freq: Two times a day (BID) | INTRAVENOUS | Status: DC
  Administered 2019-09-25 – 2019-09-29 (×9): 3 mL via INTRAVENOUS

## 2019-09-24 MED ORDER — FENTANYL CITRATE (PF) 250 MCG/5ML IJ SOLN
INTRAMUSCULAR | Status: AC
  Filled 2019-09-24: qty 25

## 2019-09-24 MED ORDER — SODIUM CHLORIDE 0.9 % IR SOLN
Status: DC | PRN
  Administered 2019-09-24: 5000 mL

## 2019-09-24 MED ORDER — ACETAMINOPHEN 500 MG PO TABS
1000.0000 mg | ORAL_TABLET | Freq: Four times a day (QID) | ORAL | Status: AC
  Administered 2019-09-24 – 2019-09-29 (×19): 1000 mg via ORAL
  Filled 2019-09-24 (×19): qty 2

## 2019-09-24 MED ORDER — CHLORHEXIDINE GLUCONATE CLOTH 2 % EX PADS
6.0000 | MEDICATED_PAD | Freq: Every day | CUTANEOUS | Status: DC
  Administered 2019-09-24 – 2019-09-28 (×5): 6 via TOPICAL

## 2019-09-24 MED ORDER — DOCUSATE SODIUM 100 MG PO CAPS
200.0000 mg | ORAL_CAPSULE | Freq: Every day | ORAL | Status: DC
  Administered 2019-09-25 – 2019-09-29 (×5): 200 mg via ORAL
  Filled 2019-09-24 (×5): qty 2

## 2019-09-24 MED ORDER — ASPIRIN EC 325 MG PO TBEC: 325.0000 mg | DELAYED_RELEASE_TABLET | Freq: Every day | ORAL | Status: DC

## 2019-09-24 MED ORDER — OXYCODONE HCL 5 MG PO TABS
5.0000 mg | ORAL_TABLET | ORAL | Status: DC | PRN
  Administered 2019-09-25 (×2): 10 mg via ORAL
  Administered 2019-09-25 – 2019-09-27 (×4): 5 mg via ORAL
  Filled 2019-09-24: qty 2
  Filled 2019-09-24 (×4): qty 1
  Filled 2019-09-24: qty 2

## 2019-09-24 MED ORDER — INSULIN REGULAR(HUMAN) IN NACL 100-0.9 UT/100ML-% IV SOLN: INTRAVENOUS | Status: DC

## 2019-09-24 MED ORDER — FAMOTIDINE IN NACL 20-0.9 MG/50ML-% IV SOLN
20.0000 mg | Freq: Two times a day (BID) | INTRAVENOUS | Status: DC
  Administered 2019-09-24: 20 mg via INTRAVENOUS

## 2019-09-24 MED ORDER — ACETAMINOPHEN 160 MG/5ML PO SOLN: 650.0000 mg | Freq: Once | ORAL | Status: AC

## 2019-09-24 MED ORDER — POTASSIUM CHLORIDE 10 MEQ/50ML IV SOLN
10.0000 meq | INTRAVENOUS | Status: AC
  Administered 2019-09-24 (×3): 10 meq via INTRAVENOUS

## 2019-09-24 MED ORDER — CHLORHEXIDINE GLUCONATE 0.12 % MT SOLN
15.0000 mL | Freq: Once | OROMUCOSAL | Status: AC
  Administered 2019-09-24: 15 mL via OROMUCOSAL
  Filled 2019-09-24: qty 15

## 2019-09-24 MED ORDER — BISACODYL 5 MG PO TBEC
10.0000 mg | DELAYED_RELEASE_TABLET | Freq: Every day | ORAL | Status: DC
  Administered 2019-09-25 – 2019-09-27 (×3): 10 mg via ORAL
  Filled 2019-09-24 (×4): qty 2

## 2019-09-24 MED ORDER — PROPOFOL 10 MG/ML IV BOLUS
INTRAVENOUS | Status: AC
  Filled 2019-09-24: qty 20

## 2019-09-24 MED ORDER — ROCURONIUM BROMIDE 100 MG/10ML IV SOLN
INTRAVENOUS | Status: DC | PRN
  Administered 2019-09-24: 100 mg via INTRAVENOUS
  Administered 2019-09-24 (×2): 50 mg via INTRAVENOUS

## 2019-09-24 MED ORDER — METOPROLOL TARTRATE 12.5 MG HALF TABLET: 12.5000 mg | ORAL_TABLET | Freq: Two times a day (BID) | ORAL | Status: DC

## 2019-09-24 MED ORDER — ASPIRIN 81 MG PO CHEW: 324.0000 mg | CHEWABLE_TABLET | Freq: Every day | ORAL | Status: DC

## 2019-09-24 MED ORDER — ALBUMIN HUMAN 5 % IV SOLN
250.0000 mL | INTRAVENOUS | Status: AC | PRN
  Administered 2019-09-24 (×4): 12.5 g via INTRAVENOUS
  Filled 2019-09-24: qty 250

## 2019-09-24 MED ORDER — MAGNESIUM SULFATE 4 GM/100ML IV SOLN
4.0000 g | Freq: Once | INTRAVENOUS | Status: AC
  Administered 2019-09-24: 4 g via INTRAVENOUS
  Filled 2019-09-24: qty 100

## 2019-09-24 MED ORDER — SODIUM CHLORIDE 0.45 % IV SOLN: INTRAVENOUS | Status: DC | PRN

## 2019-09-24 MED ORDER — MIDAZOLAM HCL 5 MG/5ML IJ SOLN
INTRAMUSCULAR | Status: DC | PRN
  Administered 2019-09-24 (×5): 2 mg via INTRAVENOUS

## 2019-09-24 MED ORDER — CHLORHEXIDINE GLUCONATE 4 % EX LIQD: 30.0000 mL | CUTANEOUS | Status: DC

## 2019-09-24 MED ORDER — TRAMADOL HCL 50 MG PO TABS
50.0000 mg | ORAL_TABLET | ORAL | Status: DC | PRN
  Administered 2019-09-24 – 2019-09-25 (×3): 100 mg via ORAL
  Filled 2019-09-24 (×3): qty 2

## 2019-09-24 MED ORDER — SODIUM CHLORIDE 0.9 % IR SOLN
Status: DC | PRN
  Administered 2019-09-24: 3000 mL

## 2019-09-24 MED ORDER — VANCOMYCIN HCL 1000 MG IV SOLR
INTRAVENOUS | Status: DC | PRN
  Administered 2019-09-24: 1000 mL

## 2019-09-24 MED ORDER — SODIUM CHLORIDE (PF) 0.9 % IJ SOLN
INTRAMUSCULAR | Status: AC
  Filled 2019-09-24: qty 20

## 2019-09-24 MED ORDER — PHENYLEPHRINE HCL (PRESSORS) 10 MG/ML IV SOLN
INTRAVENOUS | Status: AC
  Filled 2019-09-24: qty 1

## 2019-09-24 MED ORDER — PROPOFOL 10 MG/ML IV BOLUS
INTRAVENOUS | Status: DC | PRN
  Administered 2019-09-24: 40 mg via INTRAVENOUS

## 2019-09-24 MED ORDER — LACTATED RINGERS IV SOLN: 500.0000 mL | Freq: Once | INTRAVENOUS | Status: DC | PRN

## 2019-09-24 SURGICAL SUPPLY — 144 items
ADAPTER CARDIO PERF ANTE/RETRO (ADAPTER) ×4 IMPLANT
BAG DECANTER FOR FLEXI CONT (MISCELLANEOUS) ×8 IMPLANT
BLADE CLIPPER SURG (BLADE) ×4 IMPLANT
BLADE NEEDLE 3 SS STRL (BLADE) ×4 IMPLANT
BLADE STERNUM SYSTEM 6 (BLADE) ×4 IMPLANT
BLADE SURG 11 STRL SS (BLADE) ×8 IMPLANT
BNDG ELASTIC 4X5.8 VLCR STR LF (GAUZE/BANDAGES/DRESSINGS) ×4 IMPLANT
BNDG ELASTIC 6X5.8 VLCR STR LF (GAUZE/BANDAGES/DRESSINGS) ×4 IMPLANT
BNDG GAUZE ELAST 4 BULKY (GAUZE/BANDAGES/DRESSINGS) ×4 IMPLANT
CANISTER SUCT 3000ML PPV (MISCELLANEOUS) ×4 IMPLANT
CANNULA EZ GLIDE AORTIC 21FR (CANNULA) ×4 IMPLANT
CANNULA GUNDRY RCSP 15FR (MISCELLANEOUS) ×4 IMPLANT
CATH CPB KIT OWEN (MISCELLANEOUS) ×4 IMPLANT
CATH HEART VENT LEFT (CATHETERS) ×3 IMPLANT
CATH THORACIC 36FR (CATHETERS) ×4 IMPLANT
CATH THORACIC 36FR RT ANG (CATHETERS) IMPLANT
CLIP RETRACTION 3.0MM CORONARY (MISCELLANEOUS) ×4 IMPLANT
CLIP VESOCCLUDE MED 24/CT (CLIP) IMPLANT
CLIP VESOCCLUDE SM WIDE 24/CT (CLIP) ×24 IMPLANT
CNTNR URN SCR LID CUP LEK RST (MISCELLANEOUS) ×3 IMPLANT
CONN ST 1/4X3/8  BEN (MISCELLANEOUS) ×4
CONN ST 1/4X3/8 BEN (MISCELLANEOUS) ×3 IMPLANT
CONT SPEC 4OZ STRL OR WHT (MISCELLANEOUS) ×4
COVER MAYO STAND STRL (DRAPES) ×4 IMPLANT
COVER SURGICAL LIGHT HANDLE (MISCELLANEOUS) IMPLANT
DERMABOND ADVANCED (GAUZE/BANDAGES/DRESSINGS) ×1
DERMABOND ADVANCED .7 DNX12 (GAUZE/BANDAGES/DRESSINGS) ×3 IMPLANT
DEVICE SUT CK QUICK LOAD MINI (Prosthesis & Implant Heart) ×4 IMPLANT
DRAIN CHANNEL 32F RND 10.7 FF (WOUND CARE) ×8 IMPLANT
DRAPE CARDIOVASCULAR INCISE (DRAPES) ×4
DRAPE CV SPLIT W-CLR ANES SCRN (DRAPES) IMPLANT
DRAPE INCISE IOBAN 66X45 STRL (DRAPES) ×4 IMPLANT
DRAPE PERI GROIN 82X75IN TIB (DRAPES) IMPLANT
DRAPE SLUSH/WARMER DISC (DRAPES) ×4 IMPLANT
DRAPE SRG 135X102X78XABS (DRAPES) ×3 IMPLANT
DRSG AQUACEL AG ADV 3.5X14 (GAUZE/BANDAGES/DRESSINGS) ×4 IMPLANT
ELECT BLADE 4.0 EZ CLEAN MEGAD (MISCELLANEOUS) ×4
ELECT REM PT RETURN 9FT ADLT (ELECTROSURGICAL) ×8
ELECTRODE BLDE 4.0 EZ CLN MEGD (MISCELLANEOUS) ×3 IMPLANT
ELECTRODE REM PT RTRN 9FT ADLT (ELECTROSURGICAL) ×6 IMPLANT
FELT TEFLON 1X6 (MISCELLANEOUS) ×4 IMPLANT
FIBERTAPE STERNAL CLSR 2 36IN (SUTURE) ×20 IMPLANT
FIBERTAPE STERNAL CLSR 2X36 (SUTURE) ×12 IMPLANT
GAUZE SPONGE 4X4 12PLY STRL (GAUZE/BANDAGES/DRESSINGS) ×8 IMPLANT
GAUZE SPONGE 4X4 16PLY XRAY LF (GAUZE/BANDAGES/DRESSINGS) ×8 IMPLANT
GLOVE BIO SURGEON STRL SZ 6 (GLOVE) ×4 IMPLANT
GLOVE BIO SURGEON STRL SZ 6.5 (GLOVE) ×12 IMPLANT
GLOVE BIO SURGEON STRL SZ7 (GLOVE) IMPLANT
GLOVE BIO SURGEON STRL SZ7.5 (GLOVE) IMPLANT
GLOVE BIOGEL PI IND STRL 6.5 (GLOVE) ×9 IMPLANT
GLOVE BIOGEL PI IND STRL 7.0 (GLOVE) ×3 IMPLANT
GLOVE BIOGEL PI IND STRL 7.5 (GLOVE) ×9 IMPLANT
GLOVE BIOGEL PI INDICATOR 6.5 (GLOVE) ×3
GLOVE BIOGEL PI INDICATOR 7.0 (GLOVE) ×1
GLOVE BIOGEL PI INDICATOR 7.5 (GLOVE) ×3
GLOVE ORTHO TXT STRL SZ7.5 (GLOVE) ×12 IMPLANT
GOWN STRL REUS W/ TWL LRG LVL3 (GOWN DISPOSABLE) ×15 IMPLANT
GOWN STRL REUS W/ TWL XL LVL3 (GOWN DISPOSABLE) ×9 IMPLANT
GOWN STRL REUS W/TWL LRG LVL3 (GOWN DISPOSABLE) ×20
GOWN STRL REUS W/TWL XL LVL3 (GOWN DISPOSABLE) ×12
HEMOSTAT POWDER SURGIFOAM 1G (HEMOSTASIS) ×12 IMPLANT
HEMOSTAT SURGICEL 2X14 (HEMOSTASIS) ×4 IMPLANT
INSERT FOGARTY XLG (MISCELLANEOUS) ×4 IMPLANT
KIT BASIN OR (CUSTOM PROCEDURE TRAY) ×4 IMPLANT
KIT DRAINAGE VACCUM ASSIST (KITS) IMPLANT
KIT SUCTION CATH 14FR (SUCTIONS) ×12 IMPLANT
KIT SUT CK MINI COMBO 4X17 (Prosthesis & Implant Heart) ×4 IMPLANT
KIT TURNOVER KIT B (KITS) ×4 IMPLANT
KIT VASOVIEW HEMOPRO 2 VH 4000 (KITS) ×4 IMPLANT
LEAD PACING MYOCARDI (MISCELLANEOUS) ×4 IMPLANT
LINE VENT (MISCELLANEOUS) ×4 IMPLANT
MARKER GRAFT CORONARY BYPASS (MISCELLANEOUS) ×12 IMPLANT
NDL SUT PASSING CERCLAGE MED (SUTURE) ×4
NEEDLE SUT PASSING CERCLAG MED (SUTURE) ×3 IMPLANT
NS IRRIG 1000ML POUR BTL (IV SOLUTION) ×20 IMPLANT
PACK E OPEN HEART (SUTURE) ×4 IMPLANT
PACK OPEN HEART (CUSTOM PROCEDURE TRAY) ×4 IMPLANT
PAD ARMBOARD 7.5X6 YLW CONV (MISCELLANEOUS) ×8 IMPLANT
PAD ELECT DEFIB RADIOL ZOLL (MISCELLANEOUS) ×4 IMPLANT
PENCIL BUTTON HOLSTER BLD 10FT (ELECTRODE) ×4 IMPLANT
POSITIONER HEAD DONUT 9IN (MISCELLANEOUS) ×4 IMPLANT
PUNCH AORTIC ROTATE 4.0MM (MISCELLANEOUS) ×4 IMPLANT
PUNCH AORTIC ROTATE 4.5MM 8IN (MISCELLANEOUS) IMPLANT
PUNCH AORTIC ROTATE 5MM 8IN (MISCELLANEOUS) IMPLANT
SET CARDIOPLEGIA MPS 5001102 (MISCELLANEOUS) ×4 IMPLANT
SET IRRIG TUBING LAPAROSCOPIC (IRRIGATION / IRRIGATOR) ×4 IMPLANT
SOL ANTI FOG 6CC (MISCELLANEOUS) IMPLANT
SOLUTION ANTI FOG 6CC (MISCELLANEOUS)
SPONGE LAP 18X18 RF (DISPOSABLE) IMPLANT
SPONGE LAP 4X18 RFD (DISPOSABLE) ×4 IMPLANT
SUPPORT HEART JANKE-BARRON (MISCELLANEOUS) ×4 IMPLANT
SUT BONE WAX W31G (SUTURE) ×4 IMPLANT
SUT ETHIBON 2 0 V 52N 30 (SUTURE) ×8 IMPLANT
SUT ETHIBON EXCEL 2-0 V-5 (SUTURE) IMPLANT
SUT ETHIBOND 2 0 SH (SUTURE)
SUT ETHIBOND 2 0 SH 36X2 (SUTURE) IMPLANT
SUT ETHIBOND 2 0 V4 (SUTURE) IMPLANT
SUT ETHIBOND 2 0V4 GREEN (SUTURE) IMPLANT
SUT ETHIBOND 4 0 RB 1 (SUTURE) IMPLANT
SUT ETHIBOND V-5 VALVE (SUTURE) IMPLANT
SUT ETHIBOND X763 2 0 SH 1 (SUTURE) ×12 IMPLANT
SUT MNCRL AB 3-0 PS2 18 (SUTURE) ×8 IMPLANT
SUT MNCRL AB 4-0 PS2 18 (SUTURE) ×4 IMPLANT
SUT PDS AB 1 CTX 36 (SUTURE) ×8 IMPLANT
SUT PROLENE 2 0 SH DA (SUTURE) IMPLANT
SUT PROLENE 3 0 SH DA (SUTURE) ×4 IMPLANT
SUT PROLENE 3 0 SH1 36 (SUTURE) ×24 IMPLANT
SUT PROLENE 4 0 RB 1 (SUTURE) ×16
SUT PROLENE 4 0 SH DA (SUTURE) ×4 IMPLANT
SUT PROLENE 4-0 RB1 .5 CRCL 36 (SUTURE) ×12 IMPLANT
SUT PROLENE 5 0 C 1 36 (SUTURE) IMPLANT
SUT PROLENE 6 0 C 1 30 (SUTURE) ×12 IMPLANT
SUT PROLENE 7.0 RB 3 (SUTURE) ×24 IMPLANT
SUT PROLENE 8 0 BV175 6 (SUTURE) ×12 IMPLANT
SUT PROLENE BLUE 7 0 (SUTURE) ×4 IMPLANT
SUT PROLENE POLY MONO (SUTURE) IMPLANT
SUT SILK  1 MH (SUTURE) ×4
SUT SILK 1 MH (SUTURE) ×3 IMPLANT
SUT SILK 2 0 SH CR/8 (SUTURE) IMPLANT
SUT SILK 3 0 SH CR/8 (SUTURE) IMPLANT
SUT STEEL 6MS V (SUTURE) IMPLANT
SUT STEEL STERNAL CCS#1 18IN (SUTURE) IMPLANT
SUT STEEL SZ 6 DBL 3X14 BALL (SUTURE) IMPLANT
SUT VIC AB 1 CTX 36 (SUTURE)
SUT VIC AB 1 CTX36XBRD ANBCTR (SUTURE) IMPLANT
SUT VIC AB 2-0 CT1 27 (SUTURE) ×4
SUT VIC AB 2-0 CT1 TAPERPNT 27 (SUTURE) ×3 IMPLANT
SUT VIC AB 2-0 CTX 27 (SUTURE) IMPLANT
SUT VIC AB 3-0 SH 27 (SUTURE)
SUT VIC AB 3-0 SH 27X BRD (SUTURE) IMPLANT
SUT VIC AB 3-0 X1 27 (SUTURE) IMPLANT
SUT VICRYL 4-0 PS2 18IN ABS (SUTURE) IMPLANT
SYR BULB IRRIG 60ML STRL (SYRINGE) IMPLANT
SYSTEM SAHARA CHEST DRAIN ATS (WOUND CARE) ×4 IMPLANT
TAPE CLOTH SURG 4X10 WHT LF (GAUZE/BANDAGES/DRESSINGS) ×4 IMPLANT
TAPE PAPER 2X10 WHT MICROPORE (GAUZE/BANDAGES/DRESSINGS) ×4 IMPLANT
TOWEL GREEN STERILE (TOWEL DISPOSABLE) ×4 IMPLANT
TOWEL GREEN STERILE FF (TOWEL DISPOSABLE) ×8 IMPLANT
TRAY FOLEY SLVR 16FR TEMP STAT (SET/KITS/TRAYS/PACK) ×4 IMPLANT
TUBING LAP HI FLOW INSUFFLATIO (TUBING) ×4 IMPLANT
UNDERPAD 30X36 HEAVY ABSORB (UNDERPADS AND DIAPERS) ×4 IMPLANT
VALVE AORTIC SZ23 INSP/RESIL (Prosthesis & Implant Heart) ×4 IMPLANT
VENT LEFT HEART 12002 (CATHETERS) ×4
WATER STERILE IRR 1000ML POUR (IV SOLUTION) ×8 IMPLANT

## 2019-09-24 NOTE — Anesthesia Procedure Notes (Signed)
Arterial Line Insertion Start/End7/21/2021 7:37 AM, 09/24/2019 7:45 AM Performed by: Arta Bruce, MD, Dairl Ponder, CRNA, CRNA  Patient location: Pre-op. Preanesthetic checklist: patient identified, IV checked, site marked, risks and benefits discussed, surgical consent, monitors and equipment checked, pre-op evaluation, timeout performed and anesthesia consent Lidocaine 1% used for infiltration Left, radial was placed Catheter size: 20 Fr Hand hygiene performed  and maximum sterile barriers used   Attempts: 1 Procedure performed without using ultrasound guided technique. Following insertion, dressing applied. Post procedure assessment: normal and unchanged  Patient tolerated the procedure well with no immediate complications. Additional procedure comments: Placed by Stann Ore, SRNA.

## 2019-09-24 NOTE — Progress Notes (Signed)
  Echocardiogram Echocardiogram Transesophageal has been performed.  Leta Jungling M 09/24/2019, 9:17 AM

## 2019-09-24 NOTE — Anesthesia Procedure Notes (Signed)
Central Venous Catheter Insertion Performed by: Beryle Lathe, MD, anesthesiologist Start/End7/21/2021 8:00 AM, 09/24/2019 8:02 AM Patient location: Pre-op. Preanesthetic checklist: patient identified, IV checked, risks and benefits discussed, surgical consent, monitors and equipment checked, pre-op evaluation, timeout performed and anesthesia consent Position: Trendelenburg Hand hygiene performed  and maximum sterile barriers used  Total catheter length 10. PA cath was placed.Swan type:thermodilution PA Cath depth:48 Procedure performed without using ultrasound guided technique. Attempts: 1 Patient tolerated the procedure well with no immediate complications.

## 2019-09-24 NOTE — Anesthesia Procedure Notes (Signed)
Procedure Name: Intubation Date/Time: 09/24/2019 8:39 AM Performed by: Dairl Ponder, CRNA Pre-anesthesia Checklist: Patient identified, Emergency Drugs available, Suction available and Patient being monitored Patient Re-evaluated:Patient Re-evaluated prior to induction Oxygen Delivery Method: Circle System Utilized Preoxygenation: Pre-oxygenation with 100% oxygen Induction Type: IV induction Ventilation: Mask ventilation without difficulty and Oral airway inserted - appropriate to patient size Laryngoscope Size: Miller and 3 Grade View: Grade I Tube type: Oral Tube size: 8.0 mm Number of attempts: 1 Airway Equipment and Method: Stylet and Oral airway Placement Confirmation: ETT inserted through vocal cords under direct vision,  positive ETCO2 and breath sounds checked- equal and bilateral Secured at: 23 cm Tube secured with: Tape Dental Injury: Teeth and Oropharynx as per pre-operative assessment  Comments: Placed by Stann Ore, SRNA

## 2019-09-24 NOTE — Progress Notes (Signed)
      [address]       [name] [address]             [phone]      S/p AVR, CABG  Intubated but waking up and following commands  BP 96/69   Pulse 89   Temp 98.3 F (36.8 C) (Oral)   Resp 12   Ht 5\' 10"  (1.778 m)   Wt 97.1 kg   SpO2 100%   BMI 30.71 kg/m  Co= 6.7  Intake/Output Summary (Last 24 hours) at 09/24/2019 1720 Last data filed at 09/24/2019 1700 Gross per 24 hour  Intake 4765.31 ml  Output 3237 ml  Net 1528.31 ml   Hct= 31, K= 3.7- supplemented  Doing well early postop  09/26/2019 C. Viviann Spare, MD [facility] [phone]

## 2019-09-24 NOTE — Anesthesia Procedure Notes (Signed)
Central Venous Catheter Insertion Performed by: Audry Pili, MD, anesthesiologist Start/End7/21/2021 7:51 AM, 09/24/2019 8:02 AM Patient location: Pre-op. Preanesthetic checklist: patient identified, IV checked, risks and benefits discussed, surgical consent, monitors and equipment checked, pre-op evaluation, timeout performed and anesthesia consent Position: Trendelenburg Lidocaine 1% used for infiltration and patient sedated Hand hygiene performed , maximum sterile barriers used  and Seldinger technique used Catheter size: 8.5 Fr Central line was placed.MAC introducer Procedure performed using ultrasound guided technique. Ultrasound Notes:anatomy identified, needle tip was noted to be adjacent to the nerve/plexus identified, no ultrasound evidence of intravascular and/or intraneural injection and image(s) printed for medical record Attempts: 1 Following insertion, line sutured, dressing applied and Biopatch. Post procedure assessment: blood return through all ports, no air and free fluid flow  Patient tolerated the procedure well with no immediate complications.

## 2019-09-24 NOTE — Interval H&P Note (Signed)
History and Physical Interval Note:  09/24/2019 7:23 AM  Chase Saunders  has presented today for surgery, with the diagnosis of CAD SEVERE AS.  The various methods of treatment have been discussed with the patient and family. After consideration of risks, benefits and other options for treatment, the patient has consented to  Procedure(s): CORONARY ARTERY BYPASS GRAFTING (CABG) (N/A) AORTIC VALVE REPLACEMENT (AVR) (N/A) TRANSESOPHAGEAL ECHOCARDIOGRAM (TEE) (N/A) as a surgical intervention.  The patient's history has been reviewed, patient examined, no change in status, stable for surgery.  I have reviewed the patient's chart and labs.  Questions were answered to the patient's satisfaction.     Purcell Nails

## 2019-09-24 NOTE — Procedures (Addendum)
Extubation Procedure Note  Patient Details:   Name: STRAN RAPER DOB: [birth year] MRN: [id number]   Airway Documentation:    Vent end date: 09/24/19 Vent end time: 1834   Evaluation  O2 sats: stable throughout Complications: No apparent complications Patient did tolerate procedure well. Bilateral Breath Sounds: Clear   Yes, pt able to state his name. RT placed pt on 4LNC.  Megan Mans 09/24/2019, 6:35 PM

## 2019-09-24 NOTE — Brief Op Note (Addendum)
09/24/2019  2:01 PM  PATIENT:  Chase Saunders  69 y.o. male  PRE-OPERATIVE DIAGNOSIS:  1. CORONARY ARTERY DISEASE 2. SEVERE AORTIC STENOSIS  POST-OPERATIVE DIAGNOSIS:  1. CORONARY ARTERY DISEASE 2.SEVERE AORTIC STENOSIS  PROCEDURE:  TRANSESOPHAGEAL ECHOCARDIOGRAM (TEE), CORONARY ARTERY BYPASS GRAFTING (CABG) x2 (LIMA to LAD, SVG to OM2),  AORTIC VALVE REPLACEMENT (AVR) (using Cyndee Brightly Resilia Model # 11500A, Serial # [id number], Size 23 mm Aortic Valve)   SVG HARVEST and PREP: 20 minutes and 10 minutes  SURGEON:  Surgeon(s) and Role:   Purcell Nails, MD - Primary  PHYSICIAN ASSISTANT: Doree Fudge PA-C  ANESTHESIA:   general  EBL: Per anesthesia and perfusion record  DRAINS: Chest tubes placed in the mediastinal and pleural spaces    SPECIMEN:  Source of Specimen:  Native aortic valve leaflets  DISPOSITION OF SPECIMEN:  PATHOLOGY  COUNTS CORRECT:  YES  DICTATION: .Dragon Dictation  PLAN OF CARE: Admit to inpatient   PATIENT DISPOSITION:  ICU - intubated and hemodynamically stable.   Delay start of Pharmacological VTE agent (>24hrs) due to surgical blood loss or risk of bleeding: yes  BASELINE WEIGHT: 96 kg

## 2019-09-24 NOTE — Discharge Summary (Signed)
Physician Discharge Summary       [address] [place]       [name] [address]             [phone]    Patient ID: Chase Saunders MRN: [id number] DOB/AGE: January 06, 1951 69 y.o.  Admit date: 09/24/2019 Discharge date: 09/29/2019  Admission Diagnoses: 1. Unstable angina (HCC) 2. Coronary artery disease 3. Severe aortic valve stenosis  Discharge Diagnoses:  1. S/P CABG x 2 2. S/P aortic valve replacement with bioprosthetic valve 3.  4. History of Type 2 diabetes mellitus with hyperglycemia, with long-term current use of insulin (HCC) 5. History of mixed hyperlipidemia 6. History of essential hypertension 7. History of cataract 8. History of childhood asthma 9. History of remote tobacco abuse     Consults:   Procedure (s):    Aortic Valve Replacement             Edwards Inspiris Resilia Stented Bovine Pericardial Tissue Valve (size 23mm, ref # [id number], serial # [id number])   Coronary Artery Bypass Grafting x 2             Left Internal Mammary Artery to Distal Left Anterior Descending Coronary Artery             Saphenous Vein Graft to Second Obtuse Marginal Branch of Left Circumflex Coronary   History of Presenting Illness: Patient is a 69 year old male with history of aortic stenosis, hypertension, hyperlipidemia, and type 2 diabetes mellitus who has been referred for surgical consultation to discuss treatment options for management of severe symptomatic aortic stenosis and multivessel coronary artery disease with accelerating symptoms of angina pectoris.  Patient states that he has known of a heart murmur all of his life.  He has been followed for the last several years by Dr. Mariah Milling with known history of aortic stenosis.  Echocardiogram performed in 2015 suggested the presence of bicuspid aortic valve with normal left ventricular systolic function and moderate aortic valve stenosis.  The patient states that over the past year or more he has developed  progressive symptoms of exertional shortness of breath and chest discomfort.  Symptoms typically occur with more strenuous physical exertion such as mowing the lawn.  Symptoms have accelerated dramatically over the past 8 weeks, although the patient continues to deny any history of substernal chest discomfort or shortness of breath at rest or with low-level activity.  Recent follow-up echocardiogram performed August 25, 2019 revealed normal left ventricular systolic function with severe aortic stenosis.  Peak velocity across aortic valve measured as high as 4.4 m/s corresponding to mean transvalvular gradient estimated 52.5 mmHg and aortic valve area calculated 0.84 cm by VTI.  The DVI was reported 0.27 and there was reportedly mild aortic insufficiency.  Left ventricular ejection fraction was reported 60 to 65%.  Diagnostic cardiac catheterization was performed September 19, 2019.  Catheterization confirmed the presence of severe aortic stenosis with mean transvalvular gradient measured 56 mmHg by catheterization.  Right heart pressures were mildly elevated.  There was severe multivessel coronary artery disease including some ostial disease in the left main coronary artery and the ostial left anterior descending coronary artery with high-grade long segment stenosis of the mid left anterior descending coronary artery.  Cardiothoracic surgical consultation was requested.  Patient is married and lives locally in [place] with his wife.  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  He reports that he lives a somewhat sedentary lifestyle and he does not exercise on a regular  basis.  However, he reports no significant physical limitations other than the fact that over the past year he has developed progressive symptoms of exertional shortness of breath and chest discomfort as noted previously.  He denies any history of chest pain or chest tightness at rest or at night when he is sleeping.  He has not had  resting shortness of breath.  He denies PND, orthopnea, or lower extremity edema.  He reports occasional  dizzy spells, particularly if he bends forward at the waist.  He has not had any syncopal events although he states that he has come close to passing out. The patient and his wife were counseled at length regarding treatment alternatives for management of severe symptomatic aortic stenosis and coronary artery disease. Alternative approaches such as conventional aortic valve replacement with coronary artery bypass grafting, transcatheter aortic valve replacement with or without PCI and stenting of the LAD, and continued medical therapy without intervention were compared and contrasted at length.  The risks associated with conventional surgery were discussed in detail, as were expectations for post-operative convalescence, alternative surgical approaches, prosthetic valve choices, and the presence of significant CAD which needs intervention.  Issues specific to transcatheter aortic valve replacement were discussed including questions about long term valve durability, the potential for paravalvular leak, possible increased risk of need for permanent pacemaker placement, the suitability of the patient's surgical anatomy, and other potential technical complications related to the procedure itself.  Long-term prognosis without surgical intervention was discussed.  All treatment options were discussed in the context of the patient's own specific clinical presentation, past medical history, long-term prognosis and life expectancy.  All of their questions have been addressed.  The patient desires to proceed with aortic valve replacement and coronary artery bypass grafting as soon as possible.  Discussion was held comparing the relative risks of mechanical valve replacement with need for lifelong anticoagulation versus use of a bioprosthetic tissue valve and the associated potential for late structural valve  deterioration and failure.  This discussion was placed in the context of the patient's particular circumstances, and as a result the patient specifically requests that their valve be replaced using a bioprosthetic tissue valve. Pre operative carotid duplex US showed no significant internal carotid artery stenosis bilaterally. Patient underwent a CABG x 2 and aortic valve replacement (using a bioprosthetic valve) on 09/24/2019.  Brief Hospital Course:  Hospital Course: He remains afebrile and hemodynamically stable. He was weaned off Neo Synephrine drip. A line, Swan Ganz, chest tubes, and foley were removed early in his post op course. He is in sinus rhythm and was started on DAPT and low dose beta blocker. He has a history of diabetes mellitus. He was weaned off the Insulin drip. He will be restarted on Metformin closer to discharge. His HGA1C was 8.2. He will need close medical follow up after discharge. He was volume overloaded and diuresed accordingly. He had expected post op blood loss anemia. He did not require a post op transfusion. His last H and H was 9.8/29.5 on 07/26. He was volume overloaded and diuresed accordingly. He was felt surgically stable for transfer to 4E on 09/26/2019. He has been tolerating a diet and has had a bowel movement. He was requiring several liters of oxygen via Bertie but was later weaned to room air. His incisions are clean, dry, and there are no signs of infection. Epicardial pacing wires were removed on 07/26. Chest tube sutures will be removed in the office after discharge. Of note  for his follow up echo, [facility] office is to arrange for this per [name]. He is felt surgically stable for discharge today.  Latest Vital Signs: Blood pressure (!) 142/81, pulse 105, temperature (!) 97.3 F (36.3 C), temperature source Oral, resp. rate 16, height 5\' 10"  (1.778 m), weight (!) 98 kg, SpO2 95 %.  Physical Exam:  General appearance: alert, cooperative and no distress Heart:  regular rate and rhythm Lungs: clear to auscultation bilaterally Abdomen: soft, non-tender; bowel sounds normal; no masses,  no organomegaly Extremities: + pitting edema Wound: clean and dry, ecchymosis or right thigh  Discharge Condition: Stable and discharged to home.  Recent laboratory studies:  Lab Results  Component Value Date   WBC 8.6 09/29/2019   HGB 9.8 (L) 09/29/2019   HCT 29.5 (L) 09/29/2019   MCV 98.7 09/29/2019   PLT 268 09/29/2019   Lab Results  Component Value Date   NA 140 09/29/2019   K 4.4 09/29/2019   CL 104 09/29/2019   CO2 29 09/29/2019   CREATININE 0.89 09/29/2019   GLUCOSE 137 (H) 09/29/2019   Diagnostic Studies: DG Chest 2 View  Result Date: 09/27/2019 CLINICAL DATA:  Evaluate for atelectasis. EXAM: CHEST - 2 VIEW COMPARISON:  09/26/2019 FINDINGS: Normal heart size. Small bilateral pleural effusions noted. The left upper lobe platelike atelectasis is improved from previous exam. Two small areas of linear atelectasis noted in the left midlung and left lower lobe. IMPRESSION: 1. Improving left upper lobe atelectasis. 2. Small bilateral pleural effusions. Electronically Signed   By: Signa Kell M.D.   On: 09/27/2019 08:14   DG Chest 2 View  Result Date: 09/23/2019 CLINICAL DATA:  Preop for cardiac surgery. EXAM: CHEST - 2 VIEW COMPARISON:  April 03, 2016. FINDINGS: The heart size and mediastinal contours are within normal limits. Both lungs are clear. The visualized skeletal structures are unremarkable. IMPRESSION: No active cardiopulmonary disease. Electronically Signed   By: Lupita Raider M.D.   On: 09/23/2019 16:42   CT CHEST WO CONTRAST  Result Date: 09/23/2019 CLINICAL DATA:  Preoperative, aortic disease EXAM: CT CHEST WITHOUT CONTRAST TECHNIQUE: Multidetector CT imaging of the chest was performed following the standard protocol without IV contrast. COMPARISON:  None. FINDINGS: Cardiovascular: Scattered aortic atherosclerosis. Dense aortic valve  calcifications. Normal heart size. Three-vessel coronary artery calcifications. The left brachiocephalic vein lies 2.8 cm posterior to the internal table of the manubrium. The aortic arch lies 4.2 cm posterior to the internal table of the sternal body. The right atrium lies 0.6 cm posterior to the xiphoid. No pericardial effusion. Mediastinum/Nodes: No enlarged mediastinal, hilar, or axillary lymph nodes. Thyroid gland, trachea, and esophagus demonstrate no significant findings. Lungs/Pleura: Numerous very fine centrilobular pulmonary nodules, most numerous in the lung apices. No pleural effusion or pneumothorax. Upper Abdomen: No acute abnormality. Musculoskeletal: No chest wall mass or suspicious bone lesions identified. Disc degenerative disease of the thoracic spine. IMPRESSION: 1. The left brachiocephalic vein lies 2.8 cm posterior to the internal table of the manubrium. The aortic arch lies 4.2 cm posterior to the internal table of the sternal body. The right atrium lies 0.6 cm posterior to the xiphoid. 2. Scattered aortic atherosclerosis. Dense aortic valve calcifications. Aortic Atherosclerosis (ICD10-I70.0). 3. Coronary artery disease. 4. Numerous very fine centrilobular pulmonary nodules, most numerous in the lung apices. 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 Electronically Signed   By: Lauralyn Primes M.D.   On: 09/23/2019 15:12   CARDIAC CATHETERIZATION  Result Date: 09/19/2019  Mid  LAD lesion is 75% stenosed.  Ost LM lesion is 40% stenosed.  Ost LAD to Prox LAD lesion is 40% stenosed.  The left ventricular ejection fraction is 55-65% by visual estimate.  LV end diastolic pressure is normal.  The left ventricular systolic function is normal.  There is no mitral valve regurgitation.  There is severe aortic valve stenosis.    DG Chest Port 1 View  Result Date: 09/26/2019 CLINICAL DATA:  Chest pain following open heart surgery EXAM: PORTABLE CHEST 1 VIEW  COMPARISON:  09/25/2019 FINDINGS: Swan-Ganz catheter has been removed. Postsurgical changes are seen. Mediastinal and pericardial drains are noted. Left thoracostomy tube is noted as well. No pneumothorax is seen. Minimal right basilar atelectasis is noted. No bony abnormality is seen. IMPRESSION: Mild right basilar atelectasis. No pneumothorax is seen. Electronically Signed   By: Alcide Clever M.D.   On: 09/26/2019 08:18   DG Chest Port 1 View  Result Date: 09/25/2019 CLINICAL DATA:  Chest tube, status post CABG EXAM: PORTABLE CHEST 1 VIEW COMPARISON:  09/24/2019 FINDINGS: Interval endotracheal and esophagogastric extubation. Support apparatus is otherwise unchanged including right neck pulmonary vascular catheter, tip projecting over the pulmonary outflow tract, and left chest and mediastinal drainage tubes. There is minimal, less than 5% left apical pneumothorax. Mild, diffuse interstitial pulmonary opacity. Mild cardiomegaly with aortic valve prosthesis and CABG markers. No acute airspace opacity. IMPRESSION: 1. Interval endotracheal and esophagogastric extubation. Other support apparatus unchanged. 2. There is minimal, less than 5% left apical pneumothorax with chest tubes in position. 3. Mild, diffuse interstitial pulmonary opacity, likely edema. No acute airspace opacity. Electronically Signed   By: Lauralyn Primes M.D.   On: 09/25/2019 07:10   DG Chest Port 1 View  Result Date: 09/24/2019 CLINICAL DATA:  Atelectasis.  Status post heart surgery. EXAM: PORTABLE CHEST 1 VIEW COMPARISON:  09/22/2019 FINDINGS: Status post valve replacement. Endotracheal tube is in place with tip 3.0 centimeters above the carina. A nasogastric tube is in place, tip overlying the level of the gastric fundus. RIGHT IJ Swan-Ganz catheter tip overlies the pulmonary outflow tract. Mediastinal drains and LEFT-sided chest tube are in place. There is minimal bibasilar atelectasis. No pneumothorax. IMPRESSION: 1. Postoperative changes.  2. Minimal bibasilar atelectasis. Electronically Signed   By: Norva Pavlov M.D.   On: 09/24/2019 15:25   ECHO INTRAOPERATIVE TEE  Result Date: 09/24/2019  *INTRAOPERATIVE TRANSESOPHAGEAL REPORT *  Patient Name:   ABEM SHADDIX Date of Exam: 09/24/2019 Medical Rec #:  [id number]         Height:       70.0 in Accession #:    [id number]        Weight:       214.0 lb Date of Birth:  11-05-1950        BSA:          2.15 m Patient Age:    68 years          BP:           117/71 mmHg Patient Gender: M                 HR:           89 bpm. Exam Location:  Anesthesiology Transesophogeal exam was perform intraoperatively during surgical procedure. Patient was closely monitored under general anesthesia during the entirety of examination. Indications:     Coronary artery disease involving native heart without angina  pectoris, unspecified vessel or lesion type [I25.10]                   Severe aortic stenosis [I35.0] Performing Phys: [id number] CLARENCE H OWEN Diagnosing Phys: Arta Bruce MD Complications: No known complications during this procedure. POST-OP IMPRESSIONS - Left Ventricle: The left ventricle is unchanged from pre-bypass. - Aorta: The aorta appears unchanged from pre-bypass. - Left Atrial Appendage: The left atrial appendage appears unchanged from pre-bypass. - Aortic Valve: No stenosis present. A homograft bioprosthetic valve was placed, leaflets are not freely mobile. There is no regurgitation. - Mitral Valve: There is mild regurgitation. - Tricuspid Valve: The tricuspid valve appears unchanged from pre-bypass. - Interatrial Septum: The interatrial septum appears unchanged from pre-bypass. - Pericardium: The pericardium appears unchanged from pre-bypass. PRE-OP FINDINGS  Left Ventricle: The left ventricle has normal systolic function, with an ejection fraction of 55-60%. The cavity size was normal. There is no increase in left ventricular wall thickness. Right Ventricle: The right ventricle  has normal systolic function. The cavity was normal. There is no increase in right ventricular wall thickness. Left Atrium: Left atrial size was normal in size. The left atrial appendage is well visualized and there is no evidence of thrombus present.  Interatrial Septum: No atrial level shunt detected by color flow Doppler. Pericardium: There is no evidence of pericardial effusion. Mitral Valve: The mitral valve is normal in structure. Mitral valve regurgitation is not visualized by color flow Doppler. There is No evidence of mitral stenosis. Tricuspid Valve: The tricuspid valve was normal in structure. Tricuspid valve regurgitation is trivial by color flow Doppler. Aortic Valve: The aortic valve is tricuspid There is severe thickening of the aortic valve and There is severe calcifcation of the aortic valve Aortic valve regurgitation is mild to moderate by color flow Doppler. The jet is anteriorly-directed. There is  severe stenosis of the aortic valve. Pulmonic Valve: The pulmonic valve was not assessed. Pulmonic valve regurgitation is not visualized by color flow Doppler. Aorta: The aortic root and ascending aorta are normal in size and structure.  Arta Bruce MD Electronically signed by Arta Bruce MD Signature Date/Time: 09/24/2019/7:25:03 PM    Final    VAS US DOPPLER PRE CABG  Result Date: 09/23/2019 PREOPERATIVE VASCULAR EVALUATION  Indications:  Pre-CABG. Risk Factors: Hypertension, hyperlipidemia, Diabetes. Performing Technologist: Gertie Fey MHA, RVT, RDCS, RDMS  Examination Guidelines: A complete evaluation includes B-mode imaging, spectral Doppler, color Doppler, and power Doppler as needed of all accessible portions of each vessel. Bilateral testing is considered an integral part of a complete examination. Limited examinations for reoccurring indications may be performed as noted.  Right Carotid Findings: +----------+--------+--------+--------+-----------------------+--------+            PSV cm/sEDV cm/sStenosisDescribe               Comments +----------+--------+--------+--------+-----------------------+--------+ CCA Prox  78      14                                              +----------+--------+--------+--------+-----------------------+--------+ CCA Distal75      20              smooth and heterogenous         +----------+--------+--------+--------+-----------------------+--------+ ICA Prox  62      19                                              +----------+--------+--------+--------+-----------------------+--------+  ICA Distal62      23                                              +----------+--------+--------+--------+-----------------------+--------+ ECA       68      8                                               +----------+--------+--------+--------+-----------------------+--------+ Portions of this table do not appear on this page. +----------+--------+-------+----------------+------------+           PSV cm/sEDV cmsDescribe        Arm Pressure +----------+--------+-------+----------------+------------+ Subclavian79             Multiphasic, WNL             +----------+--------+-------+----------------+------------+ +---------+--------+--+--------+-+---------+ VertebralPSV cm/s31EDV cm/s7Antegrade +---------+--------+--+--------+-+---------+ Left Carotid Findings: +----------+-------+-------+--------+---------------------------------+--------+           PSV    EDV    StenosisDescribe                         Comments           cm/s   cm/s                                                     +----------+-------+-------+--------+---------------------------------+--------+ CCA Prox  101    23                                                       +----------+-------+-------+--------+---------------------------------+--------+ CCA Distal96     18             smooth and heterogenous                    +----------+-------+-------+--------+---------------------------------+--------+ ICA Prox  64     17             irregular, heterogenous and                                               calcific                                  +----------+-------+-------+--------+---------------------------------+--------+ ICA Distal77     26                                                       +----------+-------+-------+--------+---------------------------------+--------+ ECA       76     13             smooth and heterogenous                   +----------+-------+-------+--------+---------------------------------+--------+ +----------+--------+--------+----------------+------------+  SubclavianPSV cm/sEDV cm/sDescribe        Arm Pressure +----------+--------+--------+----------------+------------+           91              Multiphasic, WNL             +----------+--------+--------+----------------+------------+ +---------+--------+--+--------+--+---------+ VertebralPSV cm/s53EDV cm/s14Antegrade +---------+--------+--+--------+--+---------+  ABI Findings: +--------+------------------+-----+---------+--------+ Right   Rt Pressure (mmHg)IndexWaveform Comment  +--------+------------------+-----+---------+--------+ [id number]                    triphasic         +--------+------------------+-----+---------+--------+ PTA                            triphasic         +--------+------------------+-----+---------+--------+ DP                             triphasic         +--------+------------------+-----+---------+--------+ +--------+------------------+-----+---------+-------+ Left    Lt Pressure (mmHg)IndexWaveform Comment +--------+------------------+-----+---------+-------+ Brachial120                    triphasic        +--------+------------------+-----+---------+-------+ PTA                            triphasic         +--------+------------------+-----+---------+-------+ DP                             triphasic        +--------+------------------+-----+---------+-------+  Right Doppler Findings: +-----------+--------+-----+---------+-----------------------------------------+ Site       PressureIndexDoppler  Comments                                  +-----------+--------+-----+---------+-----------------------------------------+ Brachial   121          triphasic                                          +-----------+--------+-----+---------+-----------------------------------------+ Radial                  triphasic                                          +-----------+--------+-----+---------+-----------------------------------------+ Ulnar                   triphasic                                          +-----------+--------+-----+---------+-----------------------------------------+ Palmar Arch                      Signal is unaffected with radial                                           compression, obliterates with ulnar  compression.                              +-----------+--------+-----+---------+-----------------------------------------+  Left Doppler Findings: +-----------+--------+-----+---------+-----------------------------------------+ Site       PressureIndexDoppler  Comments                                  +-----------+--------+-----+---------+-----------------------------------------+ Brachial   120          triphasic                                          +-----------+--------+-----+---------+-----------------------------------------+ Radial                  triphasic                                          +-----------+--------+-----+---------+-----------------------------------------+ Ulnar                   triphasic                                           +-----------+--------+-----+---------+-----------------------------------------+ Palmar Arch                      Signal is unaffected with radial                                           compresion, obliterates with ulnar                                         compression.                              +-----------+--------+-----+---------+-----------------------------------------+  Summary: Right Carotid: Velocities in the right ICA are consistent with a 1-39% stenosis. Left Carotid: Velocities in the left ICA are consistent with a 1-39% stenosis. Vertebrals:  Bilateral vertebral arteries demonstrate antegrade flow. Subclavians: Normal flow hemodynamics were seen in bilateral subclavian              arteries. Right Upper Extremity: Doppler waveforms remain within normal limits with right radial compression. Doppler waveform obliterate with right ulnar compression. Left Upper Extremity: Doppler waveforms remain within normal limits with left radial compression. Doppler waveform obliterate with left ulnar compression.  Pedal Waveforms: Bilateral pedal waveforms are within normal limits at rest.  Electronically signed by Lemar Livings MD on 09/23/2019 at 5:30:53 PM.    Final    Discharge Instructions    Amb Referral to Cardiac Rehabilitation   Complete by: As directed    Diagnosis:  CABG Valve Replacement     Valve: Aortic   CABG X ___: 2   After initial evaluation and assessments completed: Virtual Based Care may be provided alone or in conjunction with Phase 2 Cardiac Rehab based on patient barriers.: Yes  Discharge Medications: Allergies as of 09/29/2019   No Known Allergies     Medication List    STOP taking these medications   Advil 200 MG tablet Generic drug: ibuprofen     TAKE these medications   acetaminophen 500 MG tablet Commonly known as: TYLENOL Take 2 tablets (1,000 mg total) by mouth every 6 (six) hours as needed.   aspirin EC 81 MG tablet Take 81 mg by  mouth at bedtime.   CINNAMON PO Take 200 mcg by mouth daily.   citalopram 20 MG tablet Commonly known as: CELEXA TAKE 1 TABLET BY MOUTH EVERY DAY What changed:   how much to take  how to take this  when to take this  additional instructions   clopidogrel 75 MG tablet Commonly known as: PLAVIX Take 1 tablet (75 mg total) by mouth daily.   furosemide 40 MG tablet Commonly known as: LASIX Take 1 tablet (40 mg total) by mouth daily.   Insulin Pen Needle 31G X 8 MM Misc Use as directed   Lantus SoloStar 100 UNIT/ML Solostar Pen Generic drug: insulin glargine Inject 10-16 Units into the skin daily. Take 16 mg in the morning and 10 mg at bedtime   losartan 50 MG tablet Commonly known as: COZAAR Take 0.5 tablets (25 mg total) by mouth daily.   lovastatin 40 MG tablet Commonly known as: MEVACOR Take 1 tablet (40 mg total) by mouth at bedtime.   Mens Multivitamin Plus Tabs Take by mouth daily.   metFORMIN 500 MG 24 hr tablet Commonly known as: GLUCOPHAGE-XR Take 500 mg by mouth at bedtime.   metoprolol tartrate 25 MG tablet Commonly known as: LOPRESSOR Take 1 tablet (25 mg total) by mouth 2 (two) times daily.   nitroGLYCERIN 0.4 MG SL tablet Commonly known as: NITROSTAT Place 1 tablet (0.4 mg total) under the tongue every 5 (five) minutes as needed for chest pain.   oxyCODONE 5 MG immediate release tablet Commonly known as: Oxy IR/ROXICODONE Take 1 tablet (5 mg total) by mouth every 6 (six) hours as needed for severe pain.   potassium chloride SA 20 MEQ tablet Commonly known as: KLOR-CON Take 1 tablet (20 mEq total) by mouth daily.   sitaGLIPtin 100 MG tablet Commonly known as: Januvia Take 1 tablet (100 mg total) by mouth daily. Start taking on: October 01, 2019      The patient has been discharged on:   1.Beta Blocker:  Yes [ y  ]                              No   [   ]                              If No, reason:  2.Ace Inhibitor/ARB: Yes [ y  ]                                      No  [    ]                                     If No, reason:  3.Statin:   Yes [place]ienne   ]  No  [   ]                  If No, reason:  4.Ecasa:  Yes  [ y  ]                  No   [   ]                  If No, reason:  Follow Up Appointments:    Follow-up Information    Clovis Riley, L.[name], MD. Call.   Specialty: Family Medicine Why: for a follow up appointment regarding further diabetes management and surveillance of HGA1C 8.2. Contact information: [address] [place] [address] [phone]        [facility] [place]. Go on 10/06/2019.   Specialty: Cardiothoracic Surgery Why: PA/LAT CXR to be taken (at [facility] which is in the same building as Dr. Orvan July office) on 08/02 at 3:00 pm;Appointment time is at 3:30 pm Contact information: [address] [place], [address] [place] [address] [phone]       Alver Sorrow, NP. Go on 10/13/2019.   Specialty: Cardiology Why: Appointment time is at 9:00 am Contact information: [address] [place] [address] [phone]               Signed:  Lowella Dandy PA-C 09/29/2019, 12:06 PM

## 2019-09-24 NOTE — Anesthesia Procedure Notes (Signed)
Arterial Line Insertion Start/End7/21/2021 8:48 AM, 09/24/2019 8:52 AM Performed by: Arta Bruce, MD, Dairl Ponder, CRNA, CRNA  Patient location: OR. Preanesthetic checklist: patient identified, IV checked, site marked, risks and benefits discussed, surgical consent, monitors and equipment checked, pre-op evaluation, timeout performed and anesthesia consent Lidocaine 1% used for infiltration Right, radial was placed Catheter size: 20 Fr Hand hygiene performed  and maximum sterile barriers used   Attempts: 1 Procedure performed without using ultrasound guided technique. Following insertion, dressing applied and Biopatch. Post procedure assessment: normal and unchanged  Patient tolerated the procedure well with no immediate complications.

## 2019-09-24 NOTE — Progress Notes (Signed)
This note also relates to the following rows which could not be included: SpO2 - Cannot attach notes to unvalidated device data  Per wean protocol

## 2019-09-24 NOTE — Transfer of Care (Signed)
Immediate Anesthesia Transfer of Care Note  Patient: Chase Saunders  Procedure(s) Performed: CORONARY ARTERY BYPASS GRAFTING (CABG) using LIMA to LAD; Endoscopic harvested right greater saphenous vein: SVG to OM2 (N/A Chest) AORTIC VALVE REPLACEMENT (AVR) using Edwards INSPIRIS Resilia 23 MM Aortic Valve (N/A Chest) TRANSESOPHAGEAL ECHOCARDIOGRAM (TEE) (N/A ) ENDOVEIN HARVEST OF GREATER SAPHENOUS VEIN (Right Leg Upper)  Patient Location: ICU  Anesthesia Type:General  Level of Consciousness: sedated and Patient remains intubated per anesthesia plan  Airway & Oxygen Therapy: Patient remains intubated per anesthesia plan and Patient placed on Ventilator (see vital sign flow sheet for setting)  Post-op Assessment: Report given to RN and Post -op Vital signs reviewed and stable  Post vital signs: Reviewed and stable  Last Vitals:  Vitals Value Taken Time  BP    Temp    Pulse    Resp    SpO2      Last Pain:  Vitals:   09/24/19 0721  TempSrc: Oral  PainSc:       Patients Stated Pain Goal: 5 (09/24/19 0708)  Complications: No complications documented.

## 2019-09-24 NOTE — Anesthesia Preprocedure Evaluation (Addendum)
Anesthesia Evaluation  Patient identified by MRN, date of birth, ID band Patient awake    Reviewed: Allergy & Precautions, NPO status , Patient's Chart, lab work & pertinent test results  Airway Mallampati: II  TM Distance: >3 FB Neck ROM: Full    Dental  (+) Dental Advisory Given, Edentulous Upper, Partial Lower   Pulmonary former smoker,    Pulmonary exam normal        Cardiovascular hypertension, Pt. on medications + CAD  + Valvular Problems/Murmurs AS  Rhythm:Regular Rate:Normal + Systolic murmurs    Neuro/Psych    GI/Hepatic   Endo/Other  diabetes, Type 2, Oral Hypoglycemic Agents  Renal/GU      Musculoskeletal   Abdominal   Peds  Hematology   Anesthesia Other Findings   Reproductive/Obstetrics                            Anesthesia Physical Anesthesia Plan  ASA: IV  Anesthesia Plan: General   Post-op Pain Management:    Induction: Intravenous  PONV Risk Score and Plan: 2 and Treatment may vary due to age or medical condition  Airway Management Planned: Oral ETT  Additional Equipment: Arterial line, CVP, PA Cath, TEE and Ultrasound Guidance Line Placement  Intra-op Plan:   Post-operative Plan: Post-operative intubation/ventilation  Informed Consent: I have reviewed the patients History and Physical, chart, labs and discussed the procedure including the risks, benefits and alternatives for the proposed anesthesia with the patient or authorized representative who has indicated his/her understanding and acceptance.     Dental advisory given  Plan Discussed with: CRNA and Surgeon  Anesthesia Plan Comments:        Anesthesia Quick Evaluation

## 2019-09-24 NOTE — Op Note (Addendum)
CARDIOTHORACIC SURGERY OPERATIVE NOTE  Date of Procedure:  09/24/2019  Preoperative Diagnosis:   Severe Aortic Stenosis  Severe Left Main Coronary Artery Disease  Unstable Angina Pectoris  Postoperative Diagnosis: Same  Procedure:    Aortic Valve Replacement  Edwards Inspiris Resilia Stented Bovine Pericardial Tissue Valve (size 29mm, ref # [id number], serial # [id number])   Coronary Artery Bypass Grafting x 2  Left Internal Mammary Artery to Distal Left Anterior Descending Coronary Artery  Saphenous Vein Graft to Second Obtuse Marginal Branch of Left Circumflex Coronary Artery  Endoscopic Vein Harvest from Right Thigh   Surgeon: Salvatore Decent. Cornelius Moras, MD  Assistant: [name], PA-C  Anesthesia: Arta Bruce, MD  Operative Findings:  Severe aortic stenosis  Normal left ventricular systolic function  Good quality left internal mammary artery conduit  Good quality saphenous vein conduit  Good quality target vessels for grafting         BRIEF CLINICAL NOTE AND INDICATIONS FOR SURGERY  Patient is a 69 year old male with history of aortic stenosis, hypertension, hyperlipidemia, and type 2 diabetes mellitus who has been referred for surgical consultation to discuss treatment options for management of severe symptomatic aortic stenosis and multivessel coronary artery disease with accelerating symptoms of angina pectoris.  Patient states that he has known of a heart murmur all of his life.  He has been followed for the last several years by Dr. Mariah Milling with known history of aortic stenosis.  Echocardiogram performed in 2015 suggested the presence of bicuspid aortic valve with normal left ventricular systolic function and moderate aortic valve stenosis.  The patient states that over the past year or more he has developed progressive symptoms of exertional shortness of breath and chest discomfort.  Symptoms typically occur with more strenuous physical exertion such as  mowing the lawn.  Symptoms have accelerated dramatically over the past 8 weeks, although the patient continues to deny any history of substernal chest discomfort or shortness of breath at rest or with low-level activity.  Recent follow-up echocardiogram performed August 25, 2019 revealed normal left ventricular systolic function with severe aortic stenosis.  Peak velocity across aortic valve measured as high as 4.4 m/s corresponding to mean transvalvular gradient estimated 52.5 mmHg and aortic valve area calculated 0.84 cm by VTI.  The DVI was reported 0.27 and there was reportedly mild aortic insufficiency.  Left ventricular ejection fraction was reported 60 to 65%.  Diagnostic cardiac catheterization was performed September 19, 2019.  Catheterization confirmed the presence of severe aortic stenosis with mean transvalvular gradient measured 56 mmHg by catheterization.  Right heart pressures were mildly elevated.  There was severe multivessel coronary artery disease including some ostial disease in the left main coronary artery and the ostial left anterior descending coronary artery with high-grade long segment stenosis of the mid left anterior descending coronary artery.  Cardiothoracic surgical consultation was requested.  The patient has been seen in consultation and counseled at length regarding the indications, risks and potential benefits of surgery.  All questions have been answered, and the patient provides full informed consent for the operation as described.    DETAILS OF THE OPERATIVE PROCEDURE  Preparation:  The patient is brought to the operating room on the above mentioned date and central monitoring was established by the anesthesia team including placement of Swan-Ganz catheter and radial arterial line. The patient is placed in the supine position on the operating table.  Intravenous antibiotics are administered. General endotracheal anesthesia is induced uneventfully. A Foley catheter is  placed.  Baseline transesophageal echocardiogram was performed.  Findings were notable for severe aortic stenosis with normal left ventricular systolic function.  The aortic valve is trileaflet.  There was severe thickening, calcification, and restricted leaflet mobility involving all 3 leaflets.  There was mild aortic insufficiency.  There was mild to moderate left ventricular hypertrophy.  The patient's chest, abdomen, both groins, and both lower extremities are prepared and draped in a sterile manner. A time out procedure is performed.   Surgical Approach and Conduit Harvest:  A median sternotomy incision was performed and the left internal mammary artery is dissected from the chest wall and prepared for bypass grafting. The left internal mammary artery is notably good quality conduit. Simultaneously, saphenous vein is obtained from the patient's right thigh using endoscopic vein harvest technique. The saphenous vein is notably good quality conduit. After removal of the saphenous vein, the small surgical incisions in the lower extremity are closed with absorbable suture. Following systemic heparinization, the left internal mammary artery was transected distally noted to have excellent flow.   Extracorporeal Cardiopulmonary Bypass and Myocardial Protection:  The pericardium is opened. The ascending aorta is normal in appearance. The ascending aorta and the right atrium are cannulated for cardiopulmonary bypass.  Adequate heparinization is verified.    A retrograde cardioplegia cannula is placed through the right atrium into the coronary sinus.  The operative field was continuously flooded with carbon dioxide gas.  The entire pre-bypass portion of the operation was notable for stable hemodynamics.  Cardiopulmonary bypass was begun and a left ventricular vent placed through the right superior pulmonary vein.  The surface of the heart inspected. Distal target vessels are selected for coronary artery  bypass grafting. A cardioplegia cannula is placed in the ascending aorta.  A temperature probe was placed in the interventricular septum.  The patient is cooled to 32C systemic temperature.  The aortic cross clamp is applied and cardioplegia is delivered initially in an antegrade fashion through the aortic root using modified [place] cold blood cardioplegia ([place] blood cardioplegia protocol).   The initial cardioplegic arrest is rapid with early diastolic arrest.  Repeat doses of cardioplegia are administered at 90 minutes and every 30 minutes thereafter through the aortic root, the coronary sinus catheter, and through subsequently placed vein grafts in order to maintain completely flat electrocardiogram and septal myocardial temperature below 15C.  Myocardial protection was felt to be  excellent.   Coronary Artery Bypass Grafting:   The second obtuse marginal branch of the left circumflex coronary artery was grafted using a reversed saphenous vein graft in an end-to-side fashion.  At the site of distal anastomosis the target vessel was good quality and measured approximately 1.8 mm in diameter.  The distal left anterior coronary artery was grafted with the left internal mammary artery in an end-to-side fashion.  At the site of distal anastomosis the target vessel was good quality and measured approximately 2.0 mm in diameter.   Aortic Valve Replacement:  An oblique transverse aortotomy incision was performed.  The aortic valve was inspected and notable for severe aortic stenosis.  The aortic valve leaflets were excised sharply and the aortic annulus decalcified.  Decalcification was notably straightforward.  The aortic annulus was sized to accept a 23 mm prosthesis.  The aortic root and left ventricle were irrigated with copious cold saline solution.  Aortic valve replacement was performed using interrupted horizontal mattress 2-0 Ethibond pledgeted sutures with pledgets in the subannular  position.  An Edwards Inspiris Resilia stented bovine  pericardial tissue valve (size 23 mm, ref # [id number], serial # [id number]) was implanted uneventfully. The valve seated appropriately with adequate space beneath the left main and right coronary artery.  The aortotomy was closed using a 2-layer closure of running 4-0 Prolene suture.   Procedure Completion:  All proximal vein graft anastomoses were placed directly to the ascending aorta prior to removal of the aortic cross clamp.  The septal myocardial temperature rose rapidly after reperfusion of the left internal mammary artery graft.  One final dose of warm retrograde "reanimation dose" cardioplegia was administered through the coronary sinus catheter while all air was evacuated through the aortic root.  The aortic cross clamp was removed after a total cross clamp time of 101 minutes.  All proximal and distal coronary anastomoses were inspected for hemostasis and appropriate graft orientation. Epicardial pacing wires are fixed to the inferior right ventricular freewall and to the right atrial appendage. The patient is rewarmed to 37C temperature. The aortic and left ventricular vents were removed.  The patient is weaned and disconnected from cardiopulmonary bypass.  The patient's rhythm at separation from bypass was sinus.  The patient was weaned from cardiopulmonary bypass without any inotropic support. Total cardiopulmonary bypass time for the operation was 143 minutes.  Followup transesophageal echocardiogram performed after separation from bypass revealed a well-seated bioprosthetic tissue valve in the aortic position that was functioning normally.  There was no paravalvular leak.  There were otherwise no changes from the preoperative exam.  The aortic and venous cannula were removed uneventfully. Protamine was administered to reverse the anticoagulation. The mediastinum and pleural space were inspected for hemostasis and irrigated with saline  solution. The mediastinum and the left pleural space were drained using 3 chest tubes placed through separate stab incisions inferiorly.  The soft tissues anterior to the aorta were reapproximated loosely. The sternum is closed with double strength sternal wire. The soft tissues anterior to the sternum were closed in multiple layers and the skin is closed with a running subcuticular skin closure.  The post-bypass portion of the operation was notable for stable rhythm and hemodynamics.  No blood products were administered during the operation.   Disposition:  The patient tolerated the procedure well and is transported to the surgical intensive care in stable condition. There are no intraoperative complications. All sponge instrument and needle counts are verified correct at completion of the operation.   Salvatore Decent. Cornelius Moras MD 09/24/2019 2:06 PM

## 2019-09-24 NOTE — Anesthesia Postprocedure Evaluation (Signed)
Anesthesia Post Note  Patient: Chase Saunders  Procedure(s) Performed: CORONARY ARTERY BYPASS GRAFTING (CABG) using LIMA to LAD; Endoscopic harvested right greater saphenous vein: SVG to OM2 (N/A Chest) AORTIC VALVE REPLACEMENT (AVR) using Edwards INSPIRIS Resilia 23 MM Aortic Valve (N/A Chest) TRANSESOPHAGEAL ECHOCARDIOGRAM (TEE) (N/A ) ENDOVEIN HARVEST OF GREATER SAPHENOUS VEIN (Right Leg Upper)     Patient location during evaluation: SICU Anesthesia Type: General Level of consciousness: sedated Pain management: pain level controlled Vital Signs Assessment: post-procedure vital signs reviewed and stable Respiratory status: patient remains intubated per anesthesia plan Cardiovascular status: stable Postop Assessment: no apparent nausea or vomiting Anesthetic complications: no   No complications documented.  Last Vitals:  Vitals:   09/24/19 1800 09/24/19 1834  BP: 94/68   Pulse: 90   Resp: (!) 21   Temp: 37.6 C   SpO2: 99% 94%    Last Pain:  Vitals:   09/24/19 0721  TempSrc: Oral  PainSc:                  Chase Saunders

## 2019-09-25 ENCOUNTER — Inpatient Hospital Stay (HOSPITAL_COMMUNITY): Payer: Medicare HMO

## 2019-09-25 ENCOUNTER — Encounter (HOSPITAL_COMMUNITY): Payer: Self-pay | Admitting: Thoracic Surgery (Cardiothoracic Vascular Surgery)

## 2019-09-25 LAB — BASIC METABOLIC PANEL
Anion gap: 9 (ref 5–15)
Anion gap: 10 (ref 5–15)
BUN: 12 mg/dL (ref 8–23)
BUN: 16 mg/dL (ref 8–23)
CO2: 21 mmol/L — ABNORMAL LOW (ref 22–32)
CO2: 22 mmol/L (ref 22–32)
Calcium: 7.2 mg/dL — ABNORMAL LOW (ref 8.9–10.3)
Calcium: 7.6 mg/dL — ABNORMAL LOW (ref 8.9–10.3)
Chloride: 110 mmol/L (ref 98–111)
Chloride: 104 mmol/L (ref 98–111)
Creatinine, Ser: 0.97 mg/dL (ref 0.61–1.24)
Creatinine, Ser: 1.06 mg/dL (ref 0.61–1.24)
GFR calc Af Amer: >60 mL/min (ref >60)
GFR calc Af Amer: >60 mL/min (ref >60)
GFR calc non Af Amer: >60 mL/min (ref >60)
GFR calc non Af Amer: >60 mL/min (ref >60)
Glucose, Bld: 138 mg/dL — ABNORMAL HIGH (ref 70–99)
Glucose, Bld: 228 mg/dL — ABNORMAL HIGH (ref 70–99)
Potassium: 4.1 mmol/L (ref 3.5–5.1)
Potassium: 4 mmol/L (ref 3.5–5.1)
Sodium: 140 mmol/L (ref 135–145)
Sodium: 136 mmol/L (ref 135–145)

## 2019-09-25 LAB — CBC
HCT: 29.4 % — ABNORMAL LOW (ref 39.0–52.0)
HCT: 30.9 % — ABNORMAL LOW (ref 39.0–52.0)
Hemoglobin: 10 g/dL — ABNORMAL LOW (ref 13.0–17.0)
Hemoglobin: 10.2 g/dL — ABNORMAL LOW (ref 13.0–17.0)
MCH: 33.8 pg (ref 26.0–34.0)
MCH: 33 pg (ref 26.0–34.0)
MCHC: 34 g/dL (ref 30.0–36.0)
MCHC: 33 g/dL (ref 30.0–36.0)
MCV: 99.3 fL (ref 80.0–100.0)
MCV: 100 fL (ref 80.0–100.0)
Platelets: 137 10*3/uL — ABNORMAL LOW (ref 150–400)
Platelets: 146 10*3/uL — ABNORMAL LOW (ref 150–400)
RBC: 2.96 MIL/uL — ABNORMAL LOW (ref 4.22–5.81)
RBC: 3.09 MIL/uL — ABNORMAL LOW (ref 4.22–5.81)
RDW: 13.2 % (ref 11.5–15.5)
RDW: 13.4 % (ref 11.5–15.5)
WBC: 12.2 10*3/uL — ABNORMAL HIGH (ref 4.0–10.5)
WBC: 20.6 10*3/uL — ABNORMAL HIGH (ref 4.0–10.5)
nRBC: 0 % (ref 0.0–0.2)
nRBC: 0 % (ref 0.0–0.2)

## 2019-09-25 LAB — GLUCOSE, CAPILLARY
Glucose-Capillary: 121 mg/dL — ABNORMAL HIGH (ref 70–99)
Glucose-Capillary: 122 mg/dL — ABNORMAL HIGH (ref 70–99)
Glucose-Capillary: 131 mg/dL — ABNORMAL HIGH (ref 70–99)
Glucose-Capillary: 135 mg/dL — ABNORMAL HIGH (ref 70–99)
Glucose-Capillary: 136 mg/dL — ABNORMAL HIGH (ref 70–99)
Glucose-Capillary: 137 mg/dL — ABNORMAL HIGH (ref 70–99)
Glucose-Capillary: 141 mg/dL — ABNORMAL HIGH (ref 70–99)
Glucose-Capillary: 152 mg/dL — ABNORMAL HIGH (ref 70–99)
Glucose-Capillary: 190 mg/dL — ABNORMAL HIGH (ref 70–99)
Glucose-Capillary: 198 mg/dL — ABNORMAL HIGH (ref 70–99)
Glucose-Capillary: 203 mg/dL — ABNORMAL HIGH (ref 70–99)
Glucose-Capillary: 204 mg/dL — ABNORMAL HIGH (ref 70–99)
Glucose-Capillary: 208 mg/dL — ABNORMAL HIGH (ref 70–99)
Glucose-Capillary: 208 mg/dL — ABNORMAL HIGH (ref 70–99)
Glucose-Capillary: 230 mg/dL — ABNORMAL HIGH (ref 70–99)

## 2019-09-25 LAB — SURGICAL PATHOLOGY

## 2019-09-25 LAB — MAGNESIUM
Magnesium: 2.6 mg/dL — ABNORMAL HIGH (ref 1.7–2.4)
Magnesium: 2.5 mg/dL — ABNORMAL HIGH (ref 1.7–2.4)

## 2019-09-25 MED ORDER — POTASSIUM CHLORIDE 10 MEQ/50ML IV SOLN
10.0000 meq | INTRAVENOUS | Status: AC
  Administered 2019-09-25 (×2): 10 meq via INTRAVENOUS
  Filled 2019-09-25 (×2): qty 50

## 2019-09-25 MED ORDER — INSULIN DETEMIR 100 UNIT/ML ~~LOC~~ SOLN
24.0000 [IU] | Freq: Two times a day (BID) | SUBCUTANEOUS | Status: DC
  Administered 2019-09-25: 24 [IU] via SUBCUTANEOUS
  Filled 2019-09-25 (×3): qty 0.24

## 2019-09-25 MED ORDER — FUROSEMIDE 10 MG/ML IJ SOLN
20.0000 mg | Freq: Four times a day (QID) | INTRAMUSCULAR | Status: AC
  Administered 2019-09-25 – 2019-09-26 (×3): 20 mg via INTRAVENOUS
  Filled 2019-09-25 (×3): qty 2

## 2019-09-25 MED ORDER — CLOPIDOGREL BISULFATE 75 MG PO TABS
75.0000 mg | ORAL_TABLET | Freq: Every day | ORAL | Status: DC
  Administered 2019-09-26 – 2019-09-29 (×4): 75 mg via ORAL
  Filled 2019-09-25 (×4): qty 1

## 2019-09-25 MED ORDER — ATORVASTATIN CALCIUM 80 MG PO TABS
80.0000 mg | ORAL_TABLET | Freq: Every day | ORAL | Status: DC
  Administered 2019-09-26 – 2019-09-28 (×3): 80 mg via ORAL
  Filled 2019-09-25 (×3): qty 1

## 2019-09-25 MED ORDER — ASPIRIN EC 81 MG PO TBEC
81.0000 mg | DELAYED_RELEASE_TABLET | Freq: Every day | ORAL | Status: DC
  Administered 2019-09-26 – 2019-09-29 (×4): 81 mg via ORAL
  Filled 2019-09-25 (×4): qty 1

## 2019-09-25 MED ORDER — INSULIN ASPART 100 UNIT/ML ~~LOC~~ SOLN
0.0000 [IU] | Freq: Three times a day (TID) | SUBCUTANEOUS | Status: DC
  Administered 2019-09-25: 4 [IU] via SUBCUTANEOUS
  Administered 2019-09-26 (×2): 8 [IU] via SUBCUTANEOUS
  Administered 2019-09-26: 12 [IU] via SUBCUTANEOUS
  Administered 2019-09-26 – 2019-09-27 (×3): 2 [IU] via SUBCUTANEOUS
  Administered 2019-09-28: 8 [IU] via SUBCUTANEOUS
  Administered 2019-09-29: 2 [IU] via SUBCUTANEOUS

## 2019-09-25 MED ORDER — METOPROLOL TARTRATE 12.5 MG HALF TABLET
12.5000 mg | ORAL_TABLET | Freq: Two times a day (BID) | ORAL | Status: DC
  Administered 2019-09-25: 12.5 mg via ORAL
  Filled 2019-09-25: qty 1

## 2019-09-25 MED ORDER — INSULIN ASPART 100 UNIT/ML ~~LOC~~ SOLN
0.0000 [IU] | SUBCUTANEOUS | Status: DC
  Administered 2019-09-25: 8 [IU] via SUBCUTANEOUS
  Administered 2019-09-25: 4 [IU] via SUBCUTANEOUS

## 2019-09-25 MED ORDER — ENOXAPARIN SODIUM 40 MG/0.4ML ~~LOC~~ SOLN
40.0000 mg | Freq: Every day | SUBCUTANEOUS | Status: DC
  Administered 2019-09-26 – 2019-09-28 (×3): 40 mg via SUBCUTANEOUS
  Filled 2019-09-25 (×3): qty 0.4

## 2019-09-25 MED ORDER — ASPIRIN EC 325 MG PO TBEC
325.0000 mg | DELAYED_RELEASE_TABLET | Freq: Every day | ORAL | Status: AC
  Administered 2019-09-25: 325 mg via ORAL
  Filled 2019-09-25: qty 1

## 2019-09-25 MED FILL — Magnesium Sulfate Inj 50%: INTRAMUSCULAR | Qty: 10 | Status: AC

## 2019-09-25 MED FILL — Sodium Chloride IV Soln 0.9%: INTRAVENOUS | Qty: 2000 | Status: AC

## 2019-09-25 MED FILL — Electrolyte-R (PH 7.4) Solution: INTRAVENOUS | Qty: 5000 | Status: AC

## 2019-09-25 MED FILL — Potassium Chloride Inj 2 mEq/ML: INTRAVENOUS | Qty: 40 | Status: AC

## 2019-09-25 MED FILL — Heparin Sodium (Porcine) Inj 1000 Unit/ML: INTRAMUSCULAR | Qty: 10 | Status: AC

## 2019-09-25 MED FILL — Heparin Sodium (Porcine) Inj 1000 Unit/ML: INTRAMUSCULAR | Qty: 30 | Status: AC

## 2019-09-25 MED FILL — Mannitol IV Soln 20%: INTRAVENOUS | Qty: 500 | Status: AC

## 2019-09-25 MED FILL — Sodium Bicarbonate IV Soln 8.4%: INTRAVENOUS | Qty: 50 | Status: AC

## 2019-09-25 MED FILL — Albumin, Human Inj 5%: INTRAVENOUS | Qty: 250 | Status: AC

## 2019-09-25 NOTE — Hospital Course (Addendum)
Hospital Course: He remains afebrile and hemodynamically stable. He was weaned off Neo Synephrine drip. A line, Swan Ganz, chest tubes, and foley were removed early in his post op course. He is in sinus rhythm and was started on DAPT and low dose beta blocker. He has a history of diabetes mellitus. He was weaned off the Insulin drip. He will be restarted on Metformin closer to discharge. His HGA1C was 8.2. He will need close medical follow up after discharge. He was volume overloaded and diuresed accordingly. He had expected post op blood loss anemia. He did not require a post op transfusion. His last H and H was ** on 07/**. He was volume overloaded and diuresed accordingly. He was felt surgically stable for transfer to 4E on 09/26/2019. He has been tolerating a diet and has had a bowel movement. He was requiring several liters of oxygen via [place] but was later weaned to room air. His incisions are clean, dry, and there are no signs of infection. Epicardial pacing wires were removed on 07/**. Chest tube sutures will be removed in the office after discharge. Of note for his follow up echo,  office is to arrange for this per [name]. He is felt surgically stable for discharge today.

## 2019-09-25 NOTE — TOC Benefit Eligibility Note (Signed)
Transition of Care [facility]) Benefit Eligibility Note    Patient Details  Name: Chase Saunders MRN: [id number] Date of Birth: 09/06/1950   Medication/Dose: [place] Pepper 10 mg. daily 30 day supply  Covered?: Yes  Tier: 3 Drug  Prescription Coverage Preferred Pharmacy: Roderic Palau with Person/Company/Phone Number:: [facility]. W/ [facility] /PH# [phone]  Co-Pay: $45.00  Prior Approval: No  Deductible:  (No Deductible)       Renie Ora Phone Number: 09/25/2019, 4:10 PM

## 2019-09-25 NOTE — Care Management (Signed)
Case Management placed a benefit's check for Jardiance 10 mg by mouth daily for 30 day supply.

## 2019-09-25 NOTE — Progress Notes (Signed)
1 Day Post-Op Procedure(s) (LRB): CORONARY ARTERY BYPASS GRAFTING (CABG) using LIMA to LAD; Endoscopic harvested right greater saphenous vein: SVG to OM2 (N/A) AORTIC VALVE REPLACEMENT (AVR) using Edwards INSPIRIS Resilia 23 MM Aortic Valve (N/A) TRANSESOPHAGEAL ECHOCARDIOGRAM (TEE) (N/A) ENDOVEIN HARVEST OF GREATER SAPHENOUS VEIN (Right) Subjective: Chest tube site pain  Objective: Vital signs in last 24 hours: Temp:  [98.8 F (37.1 C)-100.6 F (38.1 C)] 98.8 F (37.1 C) (07/22 1100) Pulse Rate:  [90-107] 97 (07/22 1900) Cardiac Rhythm: Normal sinus rhythm;Sinus tachycardia (07/22 1600) Resp:  [14-43] 29 (07/22 1900) BP: (92-125)/(53-102) 110/63 (07/22 1900) SpO2:  [89 %-98 %] 94 % (07/22 1900) Arterial Line BP: (79-149)/(35-62) 120/35 (07/22 1400) Weight:  [100.6 kg] 100.6 kg (07/22 0500)  Hemodynamic parameters for last 24 hours: PAP: (30-46)/(13-25) 39/25 CO:  [6 L/min-6.5 L/min] 6 L/min CI:  [2.8 L/min/m2-3 L/min/m2] 2.8 L/min/m2  Intake/Output from previous day: 07/21 0701 - 07/22 0700 In: 7114.2 [I.V.:4490.1; Blood:685; IV Piggyback:1939.1] Out: 4737 [Urine:2665; Blood:1142; Chest Tube:930] Intake/Output this shift: No intake/output data recorded.  General appearance: alert, cooperative and no distress Neurologic: intact Heart: regular rate and rhythm, S1, S2 normal, no murmur, click, rub or gallop Lungs: diminished breath sounds bibasilar Extremities: edema 1+ Wound: dressed, dry  Lab Results: Recent Labs    09/25/19 0415 09/25/19 1615  WBC 12.2* 20.6*  HGB 10.0* 10.2*  HCT 29.4* 30.9*  PLT 137* 146*   BMET:  Recent Labs    09/25/19 0415 09/25/19 1615  NA 140 136  K 4.1 4.0  CL 110 104  CO2 21* 22  GLUCOSE 138* 228*  BUN 12 16  CREATININE 0.97 1.06  CALCIUM 7.2* 7.6*    PT/INR:  Recent Labs    09/24/19 1426  LABPROT 19.0*  INR 1.7*   ABG    Component Value Date/Time   PHART 7.280 (L) 09/24/2019 2007   HCO3 19.1 (L) 09/24/2019 2007    TCO2 20 (L) 09/24/2019 2007   ACIDBASEDEF 7.0 (H) 09/24/2019 2007   O2SAT 93.0 09/24/2019 2007   CBG (last 3)  Recent Labs    09/25/19 1246 09/25/19 1354 09/25/19 1613  GLUCAP 208* 198* 204*    Assessment/Plan: S/P Procedure(s) (LRB): CORONARY ARTERY BYPASS GRAFTING (CABG) using LIMA to LAD; Endoscopic harvested right greater saphenous vein: SVG to OM2 (N/A) AORTIC VALVE REPLACEMENT (AVR) using Edwards INSPIRIS Resilia 23 MM Aortic Valve (N/A) TRANSESOPHAGEAL ECHOCARDIOGRAM (TEE) (N/A) ENDOVEIN HARVEST OF GREATER SAPHENOUS VEIN (Right) Diuresis f/u labs in AM--WBC 20 this pm, no F   LOS: 1 day    Chase Saunders 09/25/2019

## 2019-09-25 NOTE — Progress Notes (Signed)
[address]       [name] [address]             [phone]        CARDIOTHORACIC SURGERY PROGRESS NOTE   R1 Day Post-Op Procedure(s) (LRB): CORONARY ARTERY BYPASS GRAFTING (CABG) using LIMA to LAD; Endoscopic harvested right greater saphenous vein: SVG to OM2 (N/A) AORTIC VALVE REPLACEMENT (AVR) using Edwards INSPIRIS Resilia 23 MM Aortic Valve (N/A) TRANSESOPHAGEAL ECHOCARDIOGRAM (TEE) (N/A) ENDOVEIN HARVEST OF GREATER SAPHENOUS VEIN (Right)  Subjective: Looks good.  Mild soreness in chest.  No nausea.  No SOB  Objective: Vital signs: BP Readings from Last 1 Encounters:  09/25/19 110/68   Pulse Readings from Last 1 Encounters:  09/25/19 (!) 103   Resp Readings from Last 1 Encounters:  09/25/19 (!) 24   Temp Readings from Last 1 Encounters:  09/25/19 99.1 F (37.3 C)    Hemodynamics: PAP: (26-46)/(12-27) 39/23 CO:  [4.8 L/min-6.6 L/min] 6 L/min CI:  [2.2 L/min/m2-3.1 L/min/m2] 2.8 L/min/m2  Physical Exam:  Rhythm:   sinus  Breath sounds: clear  Heart sounds:  RRR  Incisions:  Dressings dry, intact  Abdomen:  Soft, non-distended, non-tender  Extremities:  Warm, well-perfused  Chest tubes:  low volume thin serosanguinous output, no air leak    Intake/Output from previous day: 07/21 0701 - 07/22 0700 In: 7114.2 [I.V.:4490.1; Blood:685; IV Piggyback:1939.1] Out: 6629 [UTMLY:6503; Blood:1142; Chest Tube:930] Intake/Output this shift: Total I/O In: 31.3 [I.V.:31.3] Out: -   Lab Results:  CBC: Recent Labs    09/24/19 2012 09/25/19 0415  WBC 14.7* 12.2*  HGB 10.1* 10.0*  HCT 29.7* 29.4*  PLT 146* 137*    BMET:  Recent Labs    09/24/19 2012 09/25/19 0415  NA 140 140  K 5.1 4.1  CL 111 110  CO2 20* 21*  GLUCOSE 114* 138*  BUN 10 12  CREATININE 0.95 0.97  CALCIUM 7.0* 7.2*     PT/INR:   Recent Labs    09/24/19 1426  LABPROT 19.0*  INR 1.7*    CBG (last 3)  Recent Labs    09/25/19 0546 09/25/19 0706  09/25/19 0752  GLUCAP 137* 121* 152*    ABG    Component Value Date/Time   PHART 7.280 (L) 09/24/2019 2007   PCO2ART 41.1 09/24/2019 2007   PO2ART 79 (L) 09/24/2019 2007   HCO3 19.1 (L) 09/24/2019 2007   TCO2 20 (L) 09/24/2019 2007   ACIDBASEDEF 7.0 (H) 09/24/2019 2007   O2SAT 93.0 09/24/2019 2007    CXR: PORTABLE CHEST 1 VIEW  COMPARISON:  09/24/2019  FINDINGS: Interval endotracheal and esophagogastric extubation. Support apparatus is otherwise unchanged including right neck pulmonary vascular catheter, tip projecting over the pulmonary outflow tract, and left chest and mediastinal drainage tubes. There is minimal, less than 5% left apical pneumothorax. Mild, diffuse interstitial pulmonary opacity. Mild cardiomegaly with aortic valve prosthesis and CABG markers. No acute airspace opacity.  IMPRESSION: 1. Interval endotracheal and esophagogastric extubation. Other support apparatus unchanged. 2. There is minimal, less than 5% left apical pneumothorax with chest tubes in position. 3. Mild, diffuse interstitial pulmonary opacity, likely edema. No acute airspace opacity.   Electronically Signed   By: Lauralyn Primes M.D.   On: 09/25/2019 07:10    EKG: NSR w/out acute ischemic changes nor IV conduction delay   Assessment/Plan: S/P Procedure(s) (LRB): CORONARY ARTERY BYPASS GRAFTING (CABG) using LIMA to LAD; Endoscopic harvested right greater saphenous vein: SVG to OM2 (N/A)  AORTIC VALVE REPLACEMENT (AVR) using [employer] INSPIRIS Resilia 23 MM Aortic Valve (N/A) TRANSESOPHAGEAL ECHOCARDIOGRAM (TEE) (N/A) ENDOVEIN HARVEST OF GREATER SAPHENOUS VEIN (Right)  Doing very well POD1 Maintaining NSR w/ stable hemodynamics on minimal dose Neo for BP support Breathing comfortably w/ O2 sats 94% on 4 L/min via [place] and CXR looks good Expected post op acute blood loss anemia, mild Expected post op volume excess, weight reportedly 4 kg > preop, UOP adequate Expected post op  atelectasis, mild Type II diabetes mellitus, excellent glycemic control on insulin drip   Mobilize  Wean Neo off  D/C lines  D/C tubes later today or tomorrow, depending on output  DAPT, high-dose statin, low dose beta blocker  Transition off insulin drip   Purcell Nails, MD 09/25/2019 8:51 AM

## 2019-09-26 ENCOUNTER — Inpatient Hospital Stay (HOSPITAL_COMMUNITY): Payer: Medicare HMO

## 2019-09-26 ENCOUNTER — Telehealth: Payer: Self-pay | Admitting: *Deleted

## 2019-09-26 ENCOUNTER — Other Ambulatory Visit: Payer: Self-pay | Admitting: Cardiology

## 2019-09-26 DIAGNOSIS — I350 Nonrheumatic aortic (valve) stenosis: Secondary | ICD-10-CM

## 2019-09-26 LAB — GLUCOSE, CAPILLARY
Glucose-Capillary: 214 mg/dL — ABNORMAL HIGH (ref 70–99)
Glucose-Capillary: 266 mg/dL — ABNORMAL HIGH (ref 70–99)
Glucose-Capillary: 225 mg/dL — ABNORMAL HIGH (ref 70–99)
Glucose-Capillary: 137 mg/dL — ABNORMAL HIGH (ref 70–99)

## 2019-09-26 LAB — BASIC METABOLIC PANEL
Anion gap: 7 (ref 5–15)
BUN: 15 mg/dL (ref 8–23)
CO2: 25 mmol/L (ref 22–32)
Calcium: 7.6 mg/dL — ABNORMAL LOW (ref 8.9–10.3)
Chloride: 103 mmol/L (ref 98–111)
Creatinine, Ser: 0.9 mg/dL (ref 0.61–1.24)
GFR calc Af Amer: >60 mL/min (ref >60)
GFR calc non Af Amer: >60 mL/min (ref >60)
Glucose, Bld: 209 mg/dL — ABNORMAL HIGH (ref 70–99)
Potassium: 4 mmol/L (ref 3.5–5.1)
Sodium: 135 mmol/L (ref 135–145)

## 2019-09-26 LAB — CBC
HCT: 29.8 % — ABNORMAL LOW (ref 39.0–52.0)
Hemoglobin: 10.1 g/dL — ABNORMAL LOW (ref 13.0–17.0)
MCH: 33.4 pg (ref 26.0–34.0)
MCHC: 33.9 g/dL (ref 30.0–36.0)
MCV: 98.7 fL (ref 80.0–100.0)
Platelets: 138 10*3/uL — ABNORMAL LOW (ref 150–400)
RBC: 3.02 MIL/uL — ABNORMAL LOW (ref 4.22–5.81)
RDW: 13.5 % (ref 11.5–15.5)
WBC: 16.5 10*3/uL — ABNORMAL HIGH (ref 4.0–10.5)
nRBC: 0 % (ref 0.0–0.2)

## 2019-09-26 LAB — LIPID PANEL
Cholesterol: 78 mg/dL (ref 0–200)
HDL: 28 mg/dL — ABNORMAL LOW (ref >40)
LDL Cholesterol: 37 mg/dL (ref 0–99)
Total CHOL/HDL Ratio: 2.8 RATIO
Triglycerides: 67 mg/dL (ref <150)
VLDL: 13 mg/dL (ref 0–40)

## 2019-09-26 MED ORDER — FUROSEMIDE 40 MG PO TABS
40.0000 mg | ORAL_TABLET | Freq: Every day | ORAL | Status: AC
  Administered 2019-09-27 – 2019-09-28 (×2): 40 mg via ORAL
  Filled 2019-09-26 (×2): qty 1

## 2019-09-26 MED ORDER — METOPROLOL TARTRATE 25 MG PO TABS
25.0000 mg | ORAL_TABLET | Freq: Two times a day (BID) | ORAL | Status: DC
  Administered 2019-09-26 – 2019-09-29 (×7): 25 mg via ORAL
  Filled 2019-09-26 (×7): qty 1

## 2019-09-26 MED ORDER — POTASSIUM CHLORIDE CRYS ER 20 MEQ PO TBCR
20.0000 meq | EXTENDED_RELEASE_TABLET | Freq: Every day | ORAL | Status: AC
  Administered 2019-09-26 – 2019-09-28 (×3): 20 meq via ORAL
  Filled 2019-09-26 (×3): qty 1

## 2019-09-26 MED ORDER — INSULIN DETEMIR 100 UNIT/ML ~~LOC~~ SOLN
36.0000 [IU] | Freq: Two times a day (BID) | SUBCUTANEOUS | Status: DC
  Administered 2019-09-26 – 2019-09-27 (×3): 36 [IU] via SUBCUTANEOUS
  Filled 2019-09-26 (×7): qty 0.36

## 2019-09-26 MED ORDER — FUROSEMIDE 10 MG/ML IJ SOLN
20.0000 mg | Freq: Four times a day (QID) | INTRAMUSCULAR | Status: AC
  Administered 2019-09-26 (×3): 20 mg via INTRAVENOUS
  Filled 2019-09-26 (×3): qty 2

## 2019-09-26 MED ORDER — KETOROLAC TROMETHAMINE 15 MG/ML IJ SOLN
15.0000 mg | Freq: Four times a day (QID) | INTRAMUSCULAR | Status: AC
  Administered 2019-09-26 – 2019-09-27 (×5): 15 mg via INTRAVENOUS
  Filled 2019-09-26 (×4): qty 1

## 2019-09-26 MED ORDER — ~~LOC~~ CARDIAC SURGERY, PATIENT & FAMILY EDUCATION: Freq: Once | Status: DC

## 2019-09-26 NOTE — H&P (Signed)
H&P Addendum, precardiac catheterization  Patient was seen and evaluated prior to Cardiac catheterization procedure Symptoms, prior testing details again confirmed with the patient Patient examined, no significant change from prior exam Lab work reviewed in detail personally by myself Patient understands risk and benefit of the procedure, willing to proceed  Signed, Tim Guillermo Nehring, MD, Ph.D [facility]    

## 2019-09-26 NOTE — Progress Notes (Signed)
Patient ID: Chase Saunders, male   DOB: 1950-05-19, 69 y.o.   MRN: [id number] EVENING ROUNDS NOTE :     [address]       [name] [address]             [phone]                 2 Days Post-Op Procedure(s) (LRB): CORONARY ARTERY BYPASS GRAFTING (CABG) using LIMA to LAD; Endoscopic harvested right greater saphenous vein: SVG to OM2 (N/A) AORTIC VALVE REPLACEMENT (AVR) using [employer] INSPIRIS Resilia 23 MM Aortic Valve (N/A) TRANSESOPHAGEAL ECHOCARDIOGRAM (TEE) (N/A) ENDOVEIN HARVEST OF GREATER SAPHENOUS VEIN (Right)  Total Length of Stay:  LOS: 2 days  BP (!) 97/63 (BP Location: Left Arm)   Pulse 88   Temp 98.2 F (36.8 C)   Resp (!) 29   Ht 5\' 10"  (1.778 m)   Wt (!) 100.5 kg   SpO2 95%   BMI 31.79 kg/m   .Intake/Output      07/22 0701 - 07/23 0700 07/23 0701 - 07/24 0700   P.O.  175   I.V. (mL/kg) 281 (2.8)    Blood     IV Piggyback 200    Total Intake(mL/kg) 481 (4.8) 175 (1.7)   Urine (mL/kg/hr) 2235 (0.9) 510 (0.5)   Blood     Chest Tube 500 50   Total Output 2735 560   Net -2254 -385          . sodium chloride    . lactated ringers    . lactated ringers Stopped (09/25/19 2023)     Lab Results  Component Value Date   WBC 16.5 (H) 09/26/2019   HGB 10.1 (L) 09/26/2019   HCT 29.8 (L) 09/26/2019   PLT 138 (L) 09/26/2019   GLUCOSE 209 (H) 09/26/2019   CHOL 78 09/26/2019   TRIG 67 09/26/2019   HDL 28 (L) 09/26/2019   LDLCALC 37 09/26/2019   ALT 33 09/22/2019   AST 30 09/22/2019   NA 135 09/26/2019   K 4.0 09/26/2019   CL 103 09/26/2019   CREATININE 0.90 09/26/2019   BUN 15 09/26/2019   CO2 25 09/26/2019   INR 1.7 (H) 09/24/2019   HGBA1C 8.2 (H) 09/22/2019   MICROALBUR 0.3 01/31/2013    stable day Sinus Waiting for bed on 4e  02/02/2013 MD  Beeper Delight Ovens Office [phone] 09/26/2019 5:41 PM

## 2019-09-26 NOTE — Progress Notes (Unsigned)
ec

## 2019-09-26 NOTE — Telephone Encounter (Signed)
Georgie Chard, NP has placed the echo order- should be ok to schedule now.

## 2019-09-26 NOTE — Discharge Instructions (Signed)
Discharge Instructions:  1. You may shower, please wash incisions daily with soap and water and keep dry.  If you wish to cover wounds with dressing you may do so but please keep clean and change daily.  No tub baths or swimming until incisions have completely healed.  If your incisions become red or develop any drainage please call our office at [phone]  2. No Driving until cleared by Sr. Owen's office and you are no longer using narcotic pain medications  3. Monitor your weight daily.. Please use the same scale and weigh at same time... If you gain 5-10 lbs in 48 hours with associated lower extremity swelling, please contact our office at [phone]  4. Fever of 101.5 for at least 24 hours with no source, please contact our office at [phone]  5. Activity- up as tolerated, please walk at least 3 times per day.  Avoid strenuous activity, no lifting, pushing, or pulling with your arms over 8-10 lbs for a minimum of 6 weeks  6. If any questions or concerns arise, please do not hesitate to contact our office at [phone]

## 2019-09-26 NOTE — Telephone Encounter (Signed)
-----   Message from Loman Chroman sent at 09/26/2019 12:05 PM EDT ----- Regarding: FW: echo needed Echo order had not been placed ----- Message ----- From: [name] Sent: 09/25/2019  11:35 AM EDT To: [facility] Scheduling Subject: echo needed                                    Good morning!  Pt will need a follow up echo in 6 wks please. Can you please arrange? I will have APP place the order. It's post AVR.  Thanks [name] ----- Message ----- From: [name], PA-C Sent: 09/25/2019  10:57 AM EDT To: [name] Subject: Follow Up Appointment                          This patient is s/p cabg x 2 and AVR by [name] on 09/25/2019. Cardiologist is Dr. Mariah Milling. Patient will need a 2 week follow up appointment from 07/26. He will also need an echo 6 weeks from then as well. Thanks.

## 2019-09-26 NOTE — Progress Notes (Signed)
° °   °  [address]       [name] [address]             [phone]        CARDIOTHORACIC SURGERY PROGRESS NOTE   R2 Days Post-Op Procedure(s) (LRB): CORONARY ARTERY BYPASS GRAFTING (CABG) using LIMA to LAD; Endoscopic harvested right greater saphenous vein: SVG to OM2 (N/A) AORTIC VALVE REPLACEMENT (AVR) using [employer] INSPIRIS Resilia 23 MM Aortic Valve (N/A) TRANSESOPHAGEAL ECHOCARDIOGRAM (TEE) (N/A) ENDOVEIN HARVEST OF GREATER SAPHENOUS VEIN (Right)  Subjective: Doing well.  Mild soreness related to chest tubes.  Objective: Vital signs: BP Readings from Last 1 Encounters:  09/26/19 117/65   Pulse Readings from Last 1 Encounters:  09/26/19 102   Resp Readings from Last 1 Encounters:  09/26/19 (!) 25   Temp Readings from Last 1 Encounters:  09/26/19 98 F (36.7 C)    Hemodynamics: PAP: (34-39)/(19-25) 39/25 CO:  [6 L/min] 6 L/min CI:  [2.8 L/min/m2] 2.8 L/min/m2  Physical Exam:  Rhythm:   sinus  Breath sounds: clear  Heart sounds:  RRR  Incisions:  Dressings dry, intact  Abdomen:  Soft, non-distended, non-tender  Extremities:  Warm, well-perfused  Chest tubes:  low volume thin serosanguinous output, no air leak    Intake/Output from previous day: 07/22 0701 - 07/23 0700 In: 481 [I.V.:281; IV Piggyback:200] Out: 2735 [Urine:2235; Chest Tube:500] Intake/Output this shift: No intake/output data recorded.  Lab Results:  CBC: Recent Labs    09/25/19 1615 09/26/19 0302  WBC 20.6* 16.5*  HGB 10.2* 10.1*  HCT 30.9* 29.8*  PLT 146* 138*    BMET:  Recent Labs    09/25/19 1615 09/26/19 0302  NA 136 135  K 4.0 4.0  CL 104 103  CO2 22 25  GLUCOSE 228* 209*  BUN 16 15  CREATININE 1.06 0.90  CALCIUM 7.6* 7.6*     PT/INR:   Recent Labs    09/24/19 1426  LABPROT 19.0*  INR 1.7*    CBG (last 3)  Recent Labs    09/25/19 1613 09/25/19 2142 09/26/19 0642  GLUCAP 204* 190* 214*    ABG    Component Value Date/Time    PHART 7.280 (L) 09/24/2019 2007   PCO2ART 41.1 09/24/2019 2007   PO2ART 79 (L) 09/24/2019 2007   HCO3 19.1 (L) 09/24/2019 2007   TCO2 20 (L) 09/24/2019 2007   ACIDBASEDEF 7.0 (H) 09/24/2019 2007   O2SAT 93.0 09/24/2019 2007    CXR: Looks good  Assessment/Plan: S/P Procedure(s) (LRB): CORONARY ARTERY BYPASS GRAFTING (CABG) using LIMA to LAD; Endoscopic harvested right greater saphenous vein: SVG to OM2 (N/A) AORTIC VALVE REPLACEMENT (AVR) using [employer] INSPIRIS Resilia 23 MM Aortic Valve (N/A) TRANSESOPHAGEAL ECHOCARDIOGRAM (TEE) (N/A) ENDOVEIN HARVEST OF GREATER SAPHENOUS VEIN (Right)  Doing well POD2 Maintaining NSR w/ stable BP, HR somewhat elevated Breathing comfortably w/ O2 sats 97% on 3 L/min via [place] and CXR looks good Expected post op acute blood loss anemia, mild Expected post op volume excess, weight reportedly 4 kg > preop, UOP adequate Expected post op atelectasis, mild Type II diabetes mellitus, excellent glycemic control off insulin drip   Mobilize  D/C tubes   Add toradol for comfort  Increase metoprolol dose  DAPT, high-dose statin  Continue levemir and SSI, restart home meds for DMII in 1-2 days as oral intake improves  Restart Cozaar prior to d/c if BP will allow  Tranfer 4E  Purcell Nails, MD 09/26/2019 8:05 AM

## 2019-09-27 ENCOUNTER — Inpatient Hospital Stay (HOSPITAL_COMMUNITY): Payer: Medicare HMO

## 2019-09-27 LAB — GLUCOSE, CAPILLARY
Glucose-Capillary: 147 mg/dL — ABNORMAL HIGH (ref 70–99)
Glucose-Capillary: 156 mg/dL — ABNORMAL HIGH (ref 70–99)
Glucose-Capillary: 104 mg/dL — ABNORMAL HIGH (ref 70–99)
Glucose-Capillary: 44 mg/dL — CL (ref 70–99)
Glucose-Capillary: 96 mg/dL (ref 70–99)

## 2019-09-27 LAB — CBC
HCT: 30.6 % — ABNORMAL LOW (ref 39.0–52.0)
Hemoglobin: 10.5 g/dL — ABNORMAL LOW (ref 13.0–17.0)
MCH: 33.8 pg (ref 26.0–34.0)
MCHC: 34.3 g/dL (ref 30.0–36.0)
MCV: 98.4 fL (ref 80.0–100.0)
Platelets: 170 10*3/uL (ref 150–400)
RBC: 3.11 MIL/uL — ABNORMAL LOW (ref 4.22–5.81)
RDW: 13.5 % (ref 11.5–15.5)
WBC: 14.6 10*3/uL — ABNORMAL HIGH (ref 4.0–10.5)
nRBC: 0 % (ref 0.0–0.2)

## 2019-09-27 LAB — BASIC METABOLIC PANEL
Anion gap: 9 (ref 5–15)
BUN: 15 mg/dL (ref 8–23)
CO2: 27 mmol/L (ref 22–32)
Calcium: 8.1 mg/dL — ABNORMAL LOW (ref 8.9–10.3)
Chloride: 104 mmol/L (ref 98–111)
Creatinine, Ser: 0.82 mg/dL (ref 0.61–1.24)
GFR calc Af Amer: >60 mL/min (ref >60)
GFR calc non Af Amer: >60 mL/min (ref >60)
Glucose, Bld: 62 mg/dL — ABNORMAL LOW (ref 70–99)
Potassium: 3.9 mmol/L (ref 3.5–5.1)
Sodium: 140 mmol/L (ref 135–145)

## 2019-09-27 NOTE — Progress Notes (Signed)
CARDIAC REHAB PHASE I   PRE:  Rate/Rhythm: Sinus tach 100  BP:  Supine: 112/65   SaO2: 94% RA  MODE:  Ambulation: 250 ft   POST:  Rate/Rhythem: 108  BP:  Supine: 127/72     SaO2: 100% on 2l/min  1501-1540 Oxygen saturation 90-94% on room air. Patient walked on 2l/min of oxygen using a rolling walker. Gait steady. Tolerated well. Patient assisted back to bed with call light intact. Patient encouraged to used his incentive spirometer.Gladstone Lighter, RN,BSN 09/27/2019 3:55 PM

## 2019-09-27 NOTE — Progress Notes (Signed)
Patient ID: Chase Saunders, male   DOB: [birth year], 69 y.o.   MRN: [id number] TCTS DAILY ICU PROGRESS NOTE                   [address]            [place] [address]          [phone]   3 Days Post-Op Procedure(s) (LRB): CORONARY ARTERY BYPASS GRAFTING (CABG) using LIMA to LAD; Endoscopic harvested right greater saphenous vein: SVG to OM2 (N/A) AORTIC VALVE REPLACEMENT (AVR) using [employer] INSPIRIS Resilia 23 MM Aortic Valve (N/A) TRANSESOPHAGEAL ECHOCARDIOGRAM (TEE) (N/A) ENDOVEIN HARVEST OF GREATER SAPHENOUS VEIN (Right)  Total Length of Stay:  LOS: 3 days   Subjective: Up in chair this am  Ambulating well  Objective: Vital signs in last 24 hours: Temp:  [98 F (36.7 C)-98.6 F (37 C)] 98.6 F (37 C) (07/24 0640) Pulse Rate:  [87-116] 100 (07/24 0700) Cardiac Rhythm: Normal sinus rhythm;Sinus tachycardia (07/23 2000) Resp:  [16-29] 16 (07/24 0700) BP: (82-116)/(53-76) 96/66 (07/24 0700) SpO2:  [91 %-96 %] 94 % (07/24 0700) Weight:  [100.7 kg] 100.7 kg (07/24 0500)  Filed Weights   09/25/19 0500 09/26/19 0500 09/27/19 0500  Weight: 100.6 kg (!) 100.5 kg (!) 100.7 kg    Weight change: 0.2 kg   Hemodynamic parameters for last 24 hours:    Intake/Output from previous day: 07/23 0701 - 07/24 0700 In: 585 [P.O.:585] Out: 1685 [Urine:1635; Chest Tube:50]  Intake/Output this shift: No intake/output data recorded.  Current Meds: Scheduled Meds: . acetaminophen  1,000 mg Oral Q6H  . aspirin EC  81 mg Oral Daily  . atorvastatin  80 mg Oral Q supper  . bisacodyl  10 mg Oral Daily   Or  . bisacodyl  10 mg Rectal Daily  . Chlorhexidine Gluconate Cloth  6 each Topical Daily  . clopidogrel  75 mg Oral Daily  . [place] Cardiac Surgery, Patient & Family Education   Does not apply Once  . docusate sodium  200 mg Oral Daily  . enoxaparin (LOVENOX) injection  40 mg Subcutaneous QHS  . furosemide  40 mg Oral Daily  . insulin aspart  0-24 Units  Subcutaneous TID AC & HS  . insulin detemir  36 Units Subcutaneous BID  . mouth rinse  15 mL Mouth Rinse BID  . metoprolol tartrate  25 mg Oral BID  . pantoprazole  40 mg Oral Daily  . potassium chloride  20 mEq Oral Daily  . sodium chloride flush  3 mL Intravenous Q12H   Continuous Infusions: . sodium chloride    . lactated ringers    . lactated ringers Stopped (09/25/19 2023)   PRN Meds:.dextrose, metoprolol tartrate, morphine injection, ondansetron (ZOFRAN) IV, oxyCODONE, sodium chloride flush, traMADol  General appearance: alert Neurologic: intact Heart: regular rate and rhythm, S1, S2 normal, no murmur, click, rub or gallop Lungs: clear to auscultation bilaterally Abdomen: soft, non-tender; bowel sounds normal; no masses,  no organomegaly Extremities: extremities normal, atraumatic, no cyanosis or edema Wound: Sternum stable wound intact  Lab Results: CBC: Recent Labs    09/26/19 0302 09/27/19 0351  WBC 16.5* 14.6*  HGB 10.1* 10.5*  HCT 29.8* 30.6*  PLT 138* 170   BMET:  Recent Labs    09/26/19 0302 09/27/19 0351  NA 135 140  K 4.0 3.9  CL 103 104  CO2 25 27  GLUCOSE 209* 62*  BUN 15 15  CREATININE  0.90 0.82  CALCIUM 7.6* 8.1*    CMET: Lab Results  Component Value Date   WBC 14.6 (H) 09/27/2019   HGB 10.5 (L) 09/27/2019   HCT 30.6 (L) 09/27/2019   PLT 170 09/27/2019   GLUCOSE 62 (L) 09/27/2019   CHOL 78 09/26/2019   TRIG 67 09/26/2019   HDL 28 (L) 09/26/2019   LDLCALC 37 09/26/2019   ALT 33 09/22/2019   AST 30 09/22/2019   NA 140 09/27/2019   K 3.9 09/27/2019   CL 104 09/27/2019   CREATININE 0.82 09/27/2019   BUN 15 09/27/2019   CO2 27 09/27/2019   INR 1.7 (H) 09/24/2019   HGBA1C 8.2 (H) 09/22/2019   MICROALBUR 0.3 01/31/2013      PT/INR:  Recent Labs    09/24/19 1426  LABPROT 19.0*  INR 1.7*   Radiology: No results found. PA and lateral chest x-ray done this morning looks satisfactory without significant effusions not officially  read by radiology yet  Assessment/Plan: S/P Procedure(s) (LRB): CORONARY ARTERY BYPASS GRAFTING (CABG) using LIMA to LAD; Endoscopic harvested right greater saphenous vein: SVG to OM2 (N/A) AORTIC VALVE REPLACEMENT (AVR) using [employer] INSPIRIS Resilia 23 MM Aortic Valve (N/A) TRANSESOPHAGEAL ECHOCARDIOGRAM (TEE) (N/A) ENDOVEIN HARVEST OF GREATER SAPHENOUS VEIN (Right) Mobilize Diuresis d/c pacing wires patient has orders for transfer to 4e     Delight Ovens 09/27/2019 8:14 AM

## 2019-09-27 NOTE — Progress Notes (Signed)
Called report to 4E and spoke with Fayrene Fearing, RN that will be taking care of the pt.  Will transport pt soon

## 2019-09-28 LAB — GLUCOSE, CAPILLARY
Glucose-Capillary: 45 mg/dL — ABNORMAL LOW (ref 70–99)
Glucose-Capillary: 69 mg/dL — ABNORMAL LOW (ref 70–99)
Glucose-Capillary: 219 mg/dL — ABNORMAL HIGH (ref 70–99)
Glucose-Capillary: 216 mg/dL — ABNORMAL HIGH (ref 70–99)
Glucose-Capillary: 63 mg/dL — ABNORMAL LOW (ref 70–99)
Glucose-Capillary: 144 mg/dL — ABNORMAL HIGH (ref 70–99)

## 2019-09-28 MED ORDER — INSULIN DETEMIR 100 UNIT/ML ~~LOC~~ SOLN
15.0000 [IU] | Freq: Two times a day (BID) | SUBCUTANEOUS | Status: DC
  Administered 2019-09-28 – 2019-09-29 (×3): 15 [IU] via SUBCUTANEOUS
  Filled 2019-09-28 (×4): qty 0.15

## 2019-09-28 NOTE — Progress Notes (Signed)
Mobility Specialist: Progress Note    09/28/19 1252  Mobility  Activity Ambulated in hall  Level of Assistance Modified independent, requires aide device or extra time  Assistive Device Front wheel walker  Distance Ambulated (ft) 490 ft  Mobility Response Tolerated well  Mobility performed by Mobility specialist  Bed Position Chair  $Mobility charge 1 Mobility   Pre-Mobility: 94 HR, 117/70 BP, 95% SpO2 During Mobility: 105 HR, 98% SpO2 Post-Mobility: 102 HR, 104/70 BP, 96% SpO2   Development worker, international aid

## 2019-09-28 NOTE — Progress Notes (Signed)
Hypoglycemic Event  CBG: 45  Treatment:8 oz orange juice   Symptoms: Asymptomatic  Follow-up CBG: AVWP:7948 CBG Result: 69  Possible Reasons for Event: Decreased oral intake while sleeping  Comments/MD notified:    Willa Frater

## 2019-09-28 NOTE — Progress Notes (Signed)
Hypoglycemic Event  CBG: 44  Treatment: 8 oz OJ, 21g Graham Crackers and 21g Peanut Butter  Symptoms: Sweating  Follow-up CBG: Time: 2237 CBG Result:96  Possible Reasons for Event: Decreased Oral intake.   Comments/MD notified: Levemir held due to low blood sugar    Willa Frater

## 2019-09-28 NOTE — Progress Notes (Addendum)
[address]       [place] [address]             [phone]      4 Days Post-Op Procedure(s) (LRB): CORONARY ARTERY BYPASS GRAFTING (CABG) using LIMA to LAD; Endoscopic harvested right greater saphenous vein: SVG to OM2 (N/A) AORTIC VALVE REPLACEMENT (AVR) using [employer] INSPIRIS Resilia 23 MM Aortic Valve (N/A) TRANSESOPHAGEAL ECHOCARDIOGRAM (TEE) (N/A) ENDOVEIN HARVEST OF GREATER SAPHENOUS VEIN (Right) Subjective: Some sternal discomfort, but overall feeling better   Objective: Vital signs in last 24 hours: Temp:  [97.7 F (36.5 C)-98.6 F (37 C)] 97.7 F (36.5 C) (07/25 0416) Pulse Rate:  [93-106] 100 (07/25 0416) Cardiac Rhythm: Normal sinus rhythm;Sinus tachycardia (07/24 2025) Resp:  [20-23] 23 (07/25 0416) BP: (111-131)/(69-90) 111/71 (07/25 0416) SpO2:  [95 %-98 %] 98 % (07/25 0416) Weight:  [99.2 kg] 99.2 kg (07/25 0416)  Hemodynamic parameters for last 24 hours:    Intake/Output from previous day: 07/24 0701 - 07/25 0700 In: -  Out: 1325 [Urine:1325] Intake/Output this shift: No intake/output data recorded.  General appearance: alert, cooperative and no distress Heart: regular rate and rhythm and tachy, low 100's Lungs: clear to auscultation bilaterally Abdomen: benign Extremities: + LE edema Wound: incis healing well  Lab Results: Recent Labs    09/26/19 0302 09/27/19 0351  WBC 16.5* 14.6*  HGB 10.1* 10.5*  HCT 29.8* 30.6*  PLT 138* 170   BMET:  Recent Labs    09/26/19 0302 09/27/19 0351  NA 135 140  K 4.0 3.9  CL 103 104  CO2 25 27  GLUCOSE 209* 62*  BUN 15 15  CREATININE 0.90 0.82  CALCIUM 7.6* 8.1*    PT/INR: No results for input(s): LABPROT, INR in the last 72 hours. ABG    Component Value Date/Time   PHART 7.280 (L) 09/24/2019 2007   HCO3 19.1 (L) 09/24/2019 2007   TCO2 20 (L) 09/24/2019 2007   ACIDBASEDEF 7.0 (H) 09/24/2019 2007   O2SAT 93.0 09/24/2019 2007   CBG (last 3)  Recent Labs     09/27/19 2237 09/28/19 0607 09/28/19 0632  GLUCAP 96 45* 69*    Meds Scheduled Meds: . acetaminophen  1,000 mg Oral Q6H  . aspirin EC  81 mg Oral Daily  . atorvastatin  80 mg Oral Q supper  . bisacodyl  10 mg Oral Daily   Or  . bisacodyl  10 mg Rectal Daily  . Chlorhexidine Gluconate Cloth  6 each Topical Daily  . clopidogrel  75 mg Oral Daily  . [place] Cardiac Surgery, Patient & Family Education   Does not apply Once  . docusate sodium  200 mg Oral Daily  . enoxaparin (LOVENOX) injection  40 mg Subcutaneous QHS  . furosemide  40 mg Oral Daily  . insulin aspart  0-24 Units Subcutaneous TID AC & HS  . insulin detemir  36 Units Subcutaneous BID  . mouth rinse  15 mL Mouth Rinse BID  . metoprolol tartrate  25 mg Oral BID  . pantoprazole  40 mg Oral Daily  . potassium chloride  20 mEq Oral Daily  . sodium chloride flush  3 mL Intravenous Q12H   Continuous Infusions: . sodium chloride    . lactated ringers    . lactated ringers Stopped (09/25/19 2023)   PRN Meds:.dextrose, metoprolol tartrate, morphine injection, ondansetron (ZOFRAN) IV, oxyCODONE, sodium chloride flush, traMADol  Xrays DG Chest 2 View  Result Date: 09/27/2019  CLINICAL DATA:  Evaluate for atelectasis. EXAM: CHEST - 2 VIEW COMPARISON:  09/26/2019 FINDINGS: Normal heart size. Small bilateral pleural effusions noted. The left upper lobe platelike atelectasis is improved from previous exam. Two small areas of linear atelectasis noted in the left midlung and left lower lobe. IMPRESSION: 1. Improving left upper lobe atelectasis. 2. Small bilateral pleural effusions. Electronically Signed   By: Signa Kell M.D.   On: 09/27/2019 08:14    Assessment/Plan: S/P Procedure(s) (LRB): CORONARY ARTERY BYPASS GRAFTING (CABG) using LIMA to LAD; Endoscopic harvested right greater saphenous vein: SVG to OM2 (N/A) AORTIC VALVE REPLACEMENT (AVR) using [employer] INSPIRIS Resilia 23 MM Aortic Valve (N/A) TRANSESOPHAGEAL  ECHOCARDIOGRAM (TEE) (N/A) ENDOVEIN HARVEST OF GREATER SAPHENOUS VEIN (Right)  1 doing well POD#4 2 some tachycardia at times, occas PVC's observe on current beta blocker. BP well controlled. Some volume overload- cont diuretics. BP too low to restart ARB currently 3 sats good on RA 4 some low blood sugars- will reduce levemir doses 5 no new labs today, repeat in am 6 cont pulm toilet and rehab 7 poss home 1-2 days.  LOS: 4 days    Rowe Clack  PA-C Pager [phone] 09/28/2019  Low glucose on current insulin dose, decreased I have seen and examined Lu Duffel B Foiles and agree with the above assessment  and plan.  Delight Ovens MD Beeper [phone] Office [phone] 09/28/2019 2:06 PM   Home 1-2 days

## 2019-09-29 ENCOUNTER — Other Ambulatory Visit: Payer: Self-pay | Admitting: *Deleted

## 2019-09-29 ENCOUNTER — Ambulatory Visit: Payer: Medicare HMO | Admitting: Family

## 2019-09-29 DIAGNOSIS — Z951 Presence of aortocoronary bypass graft: Secondary | ICD-10-CM

## 2019-09-29 DIAGNOSIS — Z952 Presence of prosthetic heart valve: Secondary | ICD-10-CM

## 2019-09-29 LAB — CBC
HCT: 29.5 % — ABNORMAL LOW (ref 39.0–52.0)
Hemoglobin: 9.8 g/dL — ABNORMAL LOW (ref 13.0–17.0)
MCH: 32.8 pg (ref 26.0–34.0)
MCHC: 33.2 g/dL (ref 30.0–36.0)
MCV: 98.7 fL (ref 80.0–100.0)
Platelets: 268 10*3/uL (ref 150–400)
RBC: 2.99 MIL/uL — ABNORMAL LOW (ref 4.22–5.81)
RDW: 13.6 % (ref 11.5–15.5)
WBC: 8.6 10*3/uL (ref 4.0–10.5)
nRBC: 0 % (ref 0.0–0.2)

## 2019-09-29 LAB — GLUCOSE, CAPILLARY
Glucose-Capillary: 177 mg/dL — ABNORMAL HIGH (ref 70–99)
Glucose-Capillary: 111 mg/dL — ABNORMAL HIGH (ref 70–99)
Glucose-Capillary: 180 mg/dL — ABNORMAL HIGH (ref 70–99)
Glucose-Capillary: 159 mg/dL — ABNORMAL HIGH (ref 70–99)
Glucose-Capillary: 152 mg/dL — ABNORMAL HIGH (ref 70–99)

## 2019-09-29 LAB — BASIC METABOLIC PANEL
Anion gap: 7 (ref 5–15)
BUN: 15 mg/dL (ref 8–23)
CO2: 29 mmol/L (ref 22–32)
Calcium: 8.5 mg/dL — ABNORMAL LOW (ref 8.9–10.3)
Chloride: 104 mmol/L (ref 98–111)
Creatinine, Ser: 0.89 mg/dL (ref 0.61–1.24)
GFR calc Af Amer: >60 mL/min (ref >60)
GFR calc non Af Amer: >60 mL/min (ref >60)
Glucose, Bld: 137 mg/dL — ABNORMAL HIGH (ref 70–99)
Potassium: 4.4 mmol/L (ref 3.5–5.1)
Sodium: 140 mmol/L (ref 135–145)

## 2019-09-29 MED ORDER — POTASSIUM CHLORIDE CRYS ER 20 MEQ PO TBCR
20.0000 meq | EXTENDED_RELEASE_TABLET | Freq: Every day | ORAL | Status: DC
  Administered 2019-09-29: 20 meq via ORAL
  Filled 2019-09-29: qty 1

## 2019-09-29 MED ORDER — POTASSIUM CHLORIDE CRYS ER 20 MEQ PO TBCR: 20.0000 meq | EXTENDED_RELEASE_TABLET | Freq: Every day | ORAL | 0 refills | Status: DC

## 2019-09-29 MED ORDER — ACETAMINOPHEN 500 MG PO TABS: 1000.0000 mg | ORAL_TABLET | Freq: Four times a day (QID) | ORAL | 0 refills | Status: DC | PRN

## 2019-09-29 MED ORDER — LOSARTAN POTASSIUM 25 MG PO TABS
25.0000 mg | ORAL_TABLET | Freq: Every day | ORAL | Status: DC
  Administered 2019-09-29: 25 mg via ORAL
  Filled 2019-09-29: qty 1

## 2019-09-29 MED ORDER — FUROSEMIDE 40 MG PO TABS: 40.0000 mg | ORAL_TABLET | Freq: Every day | ORAL | 0 refills | Status: DC

## 2019-09-29 MED ORDER — FUROSEMIDE 40 MG PO TABS
40.0000 mg | ORAL_TABLET | Freq: Every day | ORAL | Status: DC
  Administered 2019-09-29: 40 mg via ORAL
  Filled 2019-09-29: qty 1

## 2019-09-29 MED ORDER — OXYCODONE HCL 5 MG PO TABS: 5.0000 mg | ORAL_TABLET | Freq: Four times a day (QID) | ORAL | 0 refills | Status: DC | PRN

## 2019-09-29 MED ORDER — METOPROLOL TARTRATE 25 MG PO TABS: 25.0000 mg | ORAL_TABLET | Freq: Two times a day (BID) | ORAL | 3 refills | Status: DC

## 2019-09-29 MED ORDER — SITAGLIPTIN PHOSPHATE 100 MG PO TABS
100.0000 mg | ORAL_TABLET | Freq: Every day | ORAL | 3 refills | Status: DC
Start: 2019-10-01 — End: 2020-05-03

## 2019-09-29 MED ORDER — CLOPIDOGREL BISULFATE 75 MG PO TABS: 75.0000 mg | ORAL_TABLET | Freq: Every day | ORAL | 3 refills | Status: DC

## 2019-09-29 NOTE — Progress Notes (Signed)
EPW removed per protocol. Tips intact. VSS. Patient educated about one hour bedrest. Will continue to monitor closely.  

## 2019-09-29 NOTE — Progress Notes (Signed)
CARDIAC REHAB PHASE I   PRE:  Rate/Rhythm: 105 ST  BP:  Supine:   Sitting: 142/81  Standing:    SaO2: 99%RA  MODE:  Ambulation: 470 ft   POST:  Rate/Rhythm: 113 ST  BP:  Supine: 156/86  Sitting:   Standing:    SaO2: 98%RA 0941-1030 Pt walked 470 ft on RA with hand held asst with steady gait and tolerated well. Does not need rolling walker for home use. Education completed with pt and wife who voiced understanding. Reviewed sternal precautions and staying in the tube, IS, walking for ex, gave diabetic and heart healthy food choices, wound care and CRP 2. Referred to  CRP 2. Wrote down how to view discharge video and gave [facility] booklet.    Luetta Nutting, RN BSN  09/29/2019 10:24 AM

## 2019-09-29 NOTE — Progress Notes (Addendum)
      [address]       [place] [address]             [phone]      5 Days Post-Op Procedure(s) (LRB): CORONARY ARTERY BYPASS GRAFTING (CABG) using LIMA to LAD; Endoscopic harvested right greater saphenous vein: SVG to OM2 (N/A) AORTIC VALVE REPLACEMENT (AVR) using Edwards INSPIRIS Resilia 23 MM Aortic Valve (N/A) TRANSESOPHAGEAL ECHOCARDIOGRAM (TEE) (N/A) ENDOVEIN HARVEST OF GREATER SAPHENOUS VEIN (Right)   Subjective:  Patient is doing well.  He has no specific complaints.  He is hoping to go home today, as he is unable to rest well here.   Objective: Vital signs in last 24 hours: Temp:  [97.5 F (36.4 C)-99.1 F (37.3 C)] 97.8 F (36.6 C) (07/26 0456) Pulse Rate:  [93-106] 98 (07/26 0456) Cardiac Rhythm: Sinus tachycardia (07/25 1945) Resp:  [16-20] 20 (07/26 0456) BP: (118-149)/(66-79) 133/71 (07/26 0456) SpO2:  [94 %-100 %] 96 % (07/26 0456) Weight:  [98 kg] 98 kg (07/26 0456)  Intake/Output from previous day: 07/25 0701 - 07/26 0700 In: 720 [P.O.:720] Out: 600 [Urine:600]  General appearance: alert, cooperative and no distress Heart: regular rate and rhythm Lungs: clear to auscultation bilaterally Abdomen: soft, non-tender; bowel sounds normal; no masses,  no organomegaly Extremities: + pitting edema Wound: clean and dry, ecchymosis or right thigh  Lab Results: Recent Labs    09/27/19 0351 09/29/19 0240  WBC 14.6* 8.6  HGB 10.5* 9.8*  HCT 30.6* 29.5*  PLT 170 268   BMET:  Recent Labs    09/27/19 0351 09/29/19 0240  NA 140 140  K 3.9 4.4  CL 104 104  CO2 27 29  GLUCOSE 62* 137*  BUN 15 15  CREATININE 0.82 0.89  CALCIUM 8.1* 8.5*    PT/INR: No results for input(s): LABPROT, INR in the last 72 hours. ABG    Component Value Date/Time   PHART 7.280 (L) 09/24/2019 2007   HCO3 19.1 (L) 09/24/2019 2007   TCO2 20 (L) 09/24/2019 2007   ACIDBASEDEF 7.0 (H) 09/24/2019 2007   O2SAT 93.0 09/24/2019 2007   CBG (last 3)  Recent  Labs    09/28/19 1731 09/28/19 2121 09/29/19 0612  GLUCAP 144* 177* 111*    Assessment/Plan: S/P Procedure(s) (LRB): CORONARY ARTERY BYPASS GRAFTING (CABG) using LIMA to LAD; Endoscopic harvested right greater saphenous vein: SVG to OM2 (N/A) AORTIC VALVE REPLACEMENT (AVR) using Edwards INSPIRIS Resilia 23 MM Aortic Valve (N/A) TRANSESOPHAGEAL ECHOCARDIOGRAM (TEE) (N/A) ENDOVEIN HARVEST OF GREATER SAPHENOUS VEIN (Right)  1. CV- NSR with PVCs, BP up to 140s- will continue Lopressor, restart home Cozaar at 25 mg daily which is home dose 2. Pulm- no acute issues, continue IS 3. Renal- creatinine has been stable, weight is trending down, + pitting edema, will add TED hose, continue Lasix 40 mg daily for chronic diastolic CHF with expected post-op volume excess 4. DM- sugars controlled, will restart home diabetes medications at discharge 5. Dispo- patient stable, maintaining NSR with PVCs, unfortunately EPW wires were not removed, will d/c this AM, observe if rhythm remains stable will d/c home later today   LOS: 5 days    Lowella Dandy, PA-C 09/29/2019   I have seen and examined the patient and agree with the assessment and plan as outlined.  D/C pacing wires and D/C home later today.  Instructions given.  Purcell Nails, MD 09/29/2019 8:15 AM

## 2019-09-29 NOTE — Progress Notes (Signed)
Discharge instructions provided to patient and his wife. Medications and wound care reviewed. All questions answered. IV removed. Patient's wife to escort patient home.

## 2019-09-30 ENCOUNTER — Encounter: Payer: Self-pay | Admitting: Family

## 2019-10-01 ENCOUNTER — Telehealth: Payer: Self-pay | Admitting: Cardiovascular Disease

## 2019-10-01 NOTE — Telephone Encounter (Signed)
-----   Message from Loman Chroman sent at 09/26/2019  4:14 PM EDT ----- Regarding: FW: echo needed LVM for patient to call back and schedule echo ----- Message ----- From: [name] Sent: 09/26/2019   2:11 PM EDT To: Deno Etienne Bumgarner Subject: RE: echo needed                                [name] is placing order now, sorry we were super busy yesterday.  [name] ----- Message ----- From: Loman Chroman Sent: 09/26/2019  12:05 PM EDT To: [name], [facility] Triage Subject: FW: echo needed                                Echo order had not been placed ----- Message ----- From: [name] Sent: 09/25/2019  11:35 AM EDT To: [facility] Scheduling Subject: echo needed                                    Good morning!  Pt will need a follow up echo in 6 wks please. Can you please arrange? I will have APP place the order. It's post AVR.  Thanks [name] ----- Message ----- From: [name], PA-C Sent: 09/25/2019  10:57 AM EDT To: [name] Subject: Follow Up Appointment                          This patient is s/p cabg x 2 and AVR by [name] on 09/25/2019. Cardiologist is Dr. Mariah Milling. Patient will need a 2 week follow up appointment from 07/26. He will also need an echo 6 weeks from then as well. Thanks.

## 2019-10-01 NOTE — Telephone Encounter (Signed)
Attempted to schedule.  LMOV to call office.  ° °

## 2019-10-02 ENCOUNTER — Telehealth: Payer: Self-pay

## 2019-10-02 NOTE — Telephone Encounter (Signed)
Patient contacted the office concerned about drainage from his incision sites.  He is s/p CABG/ AVR with [name] 09/24/19.  He stated that he was weeping from his chest tube incision sites and right inner thigh EVH site.  He sent a picture to the office phone.  Upon observation, the sites on the chest looked to have very minimal drainage coming from the site.  Advised to keep it clean and dry and to let the office know if he noticed any changes to the drainage/ incision site color with temperature (for example increase in drainage, swelling, redness, red streaks, puss, foul odor or smell).  The right EVH site on inner thigh was not approximated, may have dehisced at some point, but looked without infection.  Advised patient to let us know if there were any changes as well.  He acknowledged receipt.  Patient is aware of his follow-up appointment this Monday 10/06/19.

## 2019-10-03 ENCOUNTER — Other Ambulatory Visit: Payer: Self-pay | Admitting: Thoracic Surgery (Cardiothoracic Vascular Surgery)

## 2019-10-03 DIAGNOSIS — Z951 Presence of aortocoronary bypass graft: Secondary | ICD-10-CM

## 2019-10-06 ENCOUNTER — Ambulatory Visit (INDEPENDENT_AMBULATORY_CARE_PROVIDER_SITE_OTHER): Payer: Self-pay | Admitting: Physician Assistant

## 2019-10-06 ENCOUNTER — Ambulatory Visit
Admission: RE | Admit: 2019-10-06 | Discharge: 2019-10-06 | Disposition: A | Discharge status: Home / Self Care | Payer: Medicare HMO | Source: Ambulatory Visit | Attending: Thoracic Surgery (Cardiothoracic Vascular Surgery) | Admitting: Thoracic Surgery (Cardiothoracic Vascular Surgery)

## 2019-10-06 ENCOUNTER — Other Ambulatory Visit: Payer: Self-pay

## 2019-10-06 VITALS — BP 102/66 | HR 89 | Temp 97.5°F | Resp 18 | Ht 70.0 in | Wt 211.6 lb

## 2019-10-06 DIAGNOSIS — Z951 Presence of aortocoronary bypass graft: Secondary | ICD-10-CM

## 2019-10-06 DIAGNOSIS — Z952 Presence of prosthetic heart valve: Secondary | ICD-10-CM | POA: Diagnosis not present

## 2019-10-06 DIAGNOSIS — R918 Other nonspecific abnormal finding of lung field: Secondary | ICD-10-CM | POA: Diagnosis not present

## 2019-10-06 DIAGNOSIS — J90 Pleural effusion, not elsewhere classified: Secondary | ICD-10-CM | POA: Diagnosis not present

## 2019-10-06 MED ORDER — POTASSIUM CHLORIDE CRYS ER 10 MEQ PO TBCR
10.0000 meq | EXTENDED_RELEASE_TABLET | Freq: Every day | ORAL | 1 refills | Status: DC
Start: 2019-10-06 — End: 2019-10-17

## 2019-10-06 MED ORDER — FUROSEMIDE 40 MG PO TABS
40.0000 mg | ORAL_TABLET | Freq: Every day | ORAL | 0 refills | Status: DC
Start: 2019-10-06 — End: 2019-10-17

## 2019-10-06 MED ORDER — METOPROLOL TARTRATE 25 MG PO TABS: 12.5000 mg | ORAL_TABLET | Freq: Two times a day (BID) | ORAL | 1 refills | Status: DC

## 2019-10-06 NOTE — Progress Notes (Signed)
HPI:  Patient returns for complaints of drainage from chest tube wounds and upper right thigh EVH wound. Patient has undergone an AVR, CABG x 2 by [name] on 09/24/2019. He has complaints of lightheadedness at the office (bp lower than normal). He denies shortness of breath or chest pain. He does have some complaints of discomfort right lateral sternal wound since surgery.  Current Outpatient Medications  Medication Sig Dispense Refill  . acetaminophen (TYLENOL) 500 MG tablet Take 2 tablets (1,000 mg total) by mouth every 6 (six) hours as needed. 30 tablet 0  . aspirin EC 81 MG tablet Take 81 mg by mouth at bedtime.     Marland Kitchen CINNAMON PO Take 200 mcg by mouth daily.    . citalopram (CELEXA) 20 MG tablet TAKE 1 TABLET BY MOUTH EVERY DAY (Patient taking differently: Take 20 mg by mouth daily. ) 30 tablet 2  . clopidogrel (PLAVIX) 75 MG tablet Take 1 tablet (75 mg total) by mouth daily. 30 tablet 3  . Insulin Pen Needle 31G X 8 MM MISC Use as directed 100 each 3  . LANTUS SOLOSTAR 100 UNIT/ML Solostar Pen Inject 10-16 Units into the skin daily. Take 20 mg in the morning and 15 mg at bedtime    . losartan (COZAAR) 50 MG tablet Take 0.5 tablets (25 mg total) by mouth daily. 30 tablet 0  . lovastatin (MEVACOR) 40 MG tablet Take 1 tablet (40 mg total) by mouth at bedtime. 90 tablet 3  . metFORMIN (GLUCOPHAGE-XR) 500 MG 24 hr tablet Take 500 mg by mouth at bedtime.     . metoprolol tartrate (LOPRESSOR) 25 MG tablet Take 1 tablet (25 mg total) by mouth 2 (two) times daily. 60 tablet 3  . Multiple Vitamins-Minerals (MENS MULTIVITAMIN PLUS) TABS Take by mouth daily.    . nitroGLYCERIN (NITROSTAT) 0.4 MG SL tablet Place 1 tablet (0.4 mg total) under the tongue every 5 (five) minutes as needed for chest pain. 25 tablet 3  . sitaGLIPtin (JANUVIA) 100 MG tablet Take 1 tablet (100 mg total) by mouth daily. 30 tablet 3  Vital Signs: RR 18, BP 84/55 (initial and follow up BP was 100/64), HR 89, Oxygenation 96% on  room air.   Physical Exam: CV-RRR, no murmur Pulmonary-Diminished bibasilar breath sounds L>R Extremities-No LE edema. Mild ecchymosis right thigh Wounds-Sternal wound is clean and dry. Chest tube wounds with eschars (removed) and upper right thigh EVH wound superficially dehisced (scant serous drainage) but no sign of infection.  Diagnostic Tests: Link  Show images for DG Chest 2 View Study Result  Narrative & Impression  CLINICAL DATA:  Postop CABG and aortic valve replacement.  EXAM: CHEST - 2 VIEW  COMPARISON:  Radiographs 09/27/2019 and 09/26/2019.  CT 09/23/2019.  FINDINGS: The heart size and mediastinal contours are stable status post median sternotomy and aortic valve replacement. There is improved aeration of the lung bases. There are persistent small pleural effusions bilaterally with interval slight enlargement on the left. No edema or pneumothorax. There are stable degenerative changes in the spine.  IMPRESSION: Persistent small bilateral pleural effusions with interval slight enlargement on the left. Overall improved pulmonary aeration.   Electronically Signed   By: Carey Bullocks M.D.   On: 10/06/2019 13:36     Impression and Plan: Regarding the chest tube wounds and right upper thigh EVH wounds, patient instructed to use soap and water only, then apply dry 2x2s with tape. He is to change daily and PRN. His blood pressure  was labile on this visit. I decreased his Lopressor to 12.5 mg bid. As discussed with [name], he was given Lasix 40 mg daily and a potassium supplement daily to take for one week.  Patient instructed if increased drainage or redness develops with wounds, he is to call office to be seen sooner. Of note, he has appointments for a follow up echo on 08/24 and to see cardiology on 08/27. If BP remains labile, will obtain labs, check for ortho stasis, etc.  He will return to see [place] in the office on Monday 10/13/2019 with a PA/LAT  CXR.  [name], PA-C [facility] [phone]

## 2019-10-10 ENCOUNTER — Other Ambulatory Visit: Payer: Self-pay | Admitting: Thoracic Surgery (Cardiothoracic Vascular Surgery)

## 2019-10-10 DIAGNOSIS — Z951 Presence of aortocoronary bypass graft: Secondary | ICD-10-CM

## 2019-10-10 NOTE — Interval H&P Note (Signed)
History and Physical Interval Note:  10/10/2019 12:21 PM  Chase Saunders  has presented today for surgery, with the diagnosis of RT and LT Cath   Aortic valve stenosis, unstable angina.  The various methods of treatment have been discussed with the patient and family. After consideration of risks, benefits and other options for treatment, the patient has consented to  Procedure(s): RIGHT/LEFT HEART CATH AND CORONARY ANGIOGRAPHY (Bilateral) as a surgical intervention.  The patient's history has been reviewed, patient examined, no change in status, stable for surgery.  I have reviewed the patient's chart and labs.  Questions were answered to the patient's satisfaction.     Julien Nordmann        Office Visit    Patient Name: Chase Saunders Date of Encounter: 09/15/2019   Primary Care Provider:  Clovis Riley, L.[name], MD Primary Cardiologist:  Julien Nordmann, MD Electrophysiologist:  None    Chief Complaint    Chase Saunders is a 69 y.o. male with a hx of severe aortic stenosis, remote tobacco use, DM, obesity, HTN presents today for follow up after echocardiogram    Past Medical History        Past Medical History:  Diagnosis Date   Asthma      Childhood   Cataract     Diabetes mellitus without complication (HCC)     Heart murmur     Hyperlipidemia     Hypertension           Past Surgical History:  Procedure Laterality Date   TONSILLECTOMY AND ADENOIDECTOMY   1969    Allergies   No Known Allergies   History of Present Illness    Chase Saunders is a 69 y.o. male with a hx of severe aortic stenosis, remote tobacco use, DM, obesity, HTN. He was last seen 06/23/19 by Dr. Mariah Milling.   He works at [employer]. He was previously seen in 2015 with moderate aortic valve stenosis. Echo at that time with AV mean gradient and valve area 1.28 cm^2.  When seen in clinic 06/23/19 he reported lightheadedness, fatigue.    Underwent echocardiogram 08/25/19 showing severe aortic  stenosis (AV area 0.84 cm, AV mean gradient 52.40mmHg), EF 60-65%, no RWMA, mild LVH, gr1DD, RV normal size and function     Tells me he continues to be fatigued. We has had difficulty with mowing the grass and was having to stop frequently. He was stopping due to dyspnea, fatigue, and diaphoresis.    Reports episodes of near-syncope a few times per day particularly when bending over to pick something up. Reports no chest pain, pressure, tightness.    We reviewed indication for cardiac catheterization for further workup of aortic stenosis. Long discussion regarding the procedure, risks and benefits, and likely next steps.    EKGs/Labs/Other Studies Reviewed:    The following studies were reviewed today: Echo 08/25/2019  1. Left ventricular ejection fraction, by estimation, is 60 to 65%. The  left ventricle has normal function. The left ventricle has no regional  wall motion abnormalities. There is mild left ventricular hypertrophy.  Left ventricular diastolic parameters  are consistent with Grade I diastolic dysfunction (impaired relaxation).   2. Right ventricular systolic function is normal. The right ventricular  size is normal. There is normal pulmonary artery systolic pressure.   3. The mitral valve is normal in structure. No evidence of mitral valve  regurgitation. No evidence of mitral stenosis.   4. The aortic valve is abnormal. Aortic valve regurgitation  is mild.  Severe aortic valve stenosis. Aortic valve area, by VTI measures 0.84 cm.  Aortic valve mean gradient measures 52.5 mmHg.   Comparison(s): Previous Echo showed LV EF 55-60%, no RWMA, grade I  diastolic dysfunction. Moderate AS, severely calcified valves, could not  exclude bicuspid morphology, mean gradient 24 mmHg.    EKG:  EKG is ordered today.  The ekg ordered today demonstrates NSR 80 bpm with no acute ST/T wave changes.    Recent Labs: No results found for requested labs within last 8760 hours.  Recent Lipid  Panel Labs (Brief)          Component Value Date/Time    CHOL 146 01/31/2013 0930    TRIG 115.0 01/31/2013 0930    HDL 43.30 01/31/2013 0930    CHOLHDL 3 01/31/2013 0930    VLDL 23.0 01/31/2013 0930    LDLCALC 80 01/31/2013 0930        Home Medications    Active Medications      Current Meds  Medication Sig   aspirin EC 81 MG tablet Take 81 mg by mouth daily.   Chromium-Cinnamon [phone] MCG-MG CAPS Take by mouth daily.   citalopram (CELEXA) 20 MG tablet TAKE 1 TABLET BY MOUTH EVERY DAY   Insulin Detemir (LEVEMIR FLEXTOUCH) 100 UNIT/ML Pen Inject 0.8 mLs (80 Units total) into the skin every morning.   Insulin Pen Needle 31G X 8 MM MISC Use as directed   losartan (COZAAR) 50 MG tablet Take 0.5 tablets (25 mg total) by mouth daily.   lovastatin (MEVACOR) 40 MG tablet Take 1 tablet (40 mg total) by mouth at bedtime.   metFORMIN (GLUCOPHAGE-XR) 500 MG 24 hr tablet Take 500 mg by mouth daily with breakfast.    Multiple Vitamins-Minerals (MENS MULTIVITAMIN PLUS) TABS Take by mouth daily.          Review of Systems       Review of Systems  Constitutional: Negative for chills, fever and malaise/fatigue.  Cardiovascular: Positive for dyspnea on exertion and near-syncope. Negative for chest pain, leg swelling, orthopnea, palpitations and syncope.  Respiratory: Negative for cough, shortness of breath and wheezing.   Gastrointestinal: Negative for nausea and vomiting.  Neurological: Positive for light-headedness. Negative for dizziness and weakness.    All other systems reviewed and are otherwise negative except as noted above.   Physical Exam    VS:  BP 112/64 (BP Location: Left Arm, Patient Position: Sitting, Cuff Size: Normal)   Pulse 80   Ht 5\' 10"  (1.778 m)   Wt 214 lb 8 oz (97.3 kg)   SpO2 97%   BMI 30.78 kg/m  , BMI Body mass index is 30.78 kg/m. GEN: Well nourished, well developed, in no acute distress. HEENT: normal. Neck: Supple, no JVD, carotid bruits, or  masses. Cardiac: RRR, no, rubs, or gallops. Gr 2-3/6 systolic murmur. No clubbing, cyanosis, edema.  Radials/DP/PT 2+ and equal bilaterally.  Respiratory:  Respirations regular and unlabored, clear to auscultation bilaterally. GI: Soft, nontender, nondistended, BS + x 4. MS: No deformity or atrophy. Skin: Warm and dry, no rash. Neuro:  Strength and sensation are intact. Psych: Normal affect.   Assessment & Plan    Severe aortic stenosis - Noted by echo 08/2019 with valve area 0.84 cm^2 and mean gradient 52.64mmHg. Symptomatic with dyspnea and near-syncope.  Plan for [facility] for further evaluation of aortic stenosis. The patient understands that risks include but are not limited to stroke (1 in 1000), death (1 in 1000),  kidney failure [usually temporary] (1 in 500), bleeding (1 in 200), allergic reaction [possibly serious] (1 in 200), and agrees to proceed.  Discussed repair modalities of TAVR vs SAVR. Discussed that with his age, likely SAVR will be procedure of choice but await results of cath for referral to cardiothoracic surgery. We reviewed precautions with near-syncope including adequate hydration, slow position changes.   HTN - BP well controlled. Continue Losartan 25mg  daily per PCP.    DM2 - 04/2019 A1c of 8.0. Continue to follow with PCP.    HLD - 04/2019 lipid panel total cholesterol 133, triglycerides 89, HDL 49, LDL 67. Continue Lovastatin 40mg  daily.    Disposition: Follow up after cardiac catheterization with Dr. 01-31-1981 or APP    , NP 09/15/2019, 1:52 PM

## 2019-10-10 NOTE — Interval H&P Note (Signed)
History and Physical Interval Note:  10/10/2019 12:22 PM  Chase Saunders  has presented today for surgery, with the diagnosis of RT and LT Cath   Aortic valve stenosis, unstable angina.  The various methods of treatment have been discussed with the patient and family. After consideration of risks, benefits and other options for treatment, the patient has consented to  Procedure(s): RIGHT/LEFT HEART CATH AND CORONARY ANGIOGRAPHY (Bilateral) as a surgical intervention.  The patient's history has been reviewed, patient examined, no change in status, stable for surgery.  I have reviewed the patient's chart and labs.  Questions were answered to the patient's satisfaction.     Julien Nordmann       Office Visit    Patient Name: Chase Saunders Date of Encounter: 09/15/2019   Primary Care Provider:  Clovis Riley, L.[name], MD Primary Cardiologist:  Julien Nordmann, MD Electrophysiologist:  None    Chief Complaint    Chase Saunders is a 69 y.o. male with a hx of severe aortic stenosis, remote tobacco use, DM, obesity, HTN presents today for follow up after echocardiogram    Past Medical History        Past Medical History:  Diagnosis Date   Asthma      Childhood   Cataract     Diabetes mellitus without complication (HCC)     Heart murmur     Hyperlipidemia     Hypertension           Past Surgical History:  Procedure Laterality Date   TONSILLECTOMY AND ADENOIDECTOMY   1969    Allergies   No Known Allergies   History of Present Illness    Chase Saunders is a 69 y.o. male with a hx of severe aortic stenosis, remote tobacco use, DM, obesity, HTN. He was last seen 06/23/19 by Dr. Mariah Milling.   He works at [employer]. He was previously seen in 2015 with moderate aortic valve stenosis. Echo at that time with AV mean gradient and valve area 1.28 cm^2.  When seen in clinic 06/23/19 he reported lightheadedness, fatigue.    Underwent echocardiogram 08/25/19 showing severe aortic  stenosis (AV area 0.84 cm, AV mean gradient 52.68mmHg), EF 60-65%, no RWMA, mild LVH, gr1DD, RV normal size and function     Tells me he continues to be fatigued. We has had difficulty with mowing the grass and was having to stop frequently. He was stopping due to dyspnea, fatigue, and diaphoresis.    Reports episodes of near-syncope a few times per day particularly when bending over to pick something up. Reports no chest pain, pressure, tightness.    We reviewed indication for cardiac catheterization for further workup of aortic stenosis. Long discussion regarding the procedure, risks and benefits, and likely next steps.    EKGs/Labs/Other Studies Reviewed:    The following studies were reviewed today: Echo 08/25/2019  1. Left ventricular ejection fraction, by estimation, is 60 to 65%. The  left ventricle has normal function. The left ventricle has no regional  wall motion abnormalities. There is mild left ventricular hypertrophy.  Left ventricular diastolic parameters  are consistent with Grade I diastolic dysfunction (impaired relaxation).   2. Right ventricular systolic function is normal. The right ventricular  size is normal. There is normal pulmonary artery systolic pressure.   3. The mitral valve is normal in structure. No evidence of mitral valve  regurgitation. No evidence of mitral stenosis.   4. The aortic valve is abnormal. Aortic valve regurgitation is  mild.  Severe aortic valve stenosis. Aortic valve area, by VTI measures 0.84 cm.  Aortic valve mean gradient measures 52.5 mmHg.   Comparison(s): Previous Echo showed LV EF 55-60%, no RWMA, grade I  diastolic dysfunction. Moderate AS, severely calcified valves, could not  exclude bicuspid morphology, mean gradient 24 mmHg.    EKG:  EKG is ordered today.  The ekg ordered today demonstrates NSR 80 bpm with no acute ST/T wave changes.    Recent Labs: No results found for requested labs within last 8760 hours.  Recent Lipid  Panel Labs (Brief)          Component Value Date/Time    CHOL 146 01/31/2013 0930    TRIG 115.0 01/31/2013 0930    HDL 43.30 01/31/2013 0930    CHOLHDL 3 01/31/2013 0930    VLDL 23.0 01/31/2013 0930    LDLCALC 80 01/31/2013 0930        Home Medications    Active Medications      Current Meds  Medication Sig   aspirin EC 81 MG tablet Take 81 mg by mouth daily.   Chromium-Cinnamon [phone] MCG-MG CAPS Take by mouth daily.   citalopram (CELEXA) 20 MG tablet TAKE 1 TABLET BY MOUTH EVERY DAY   Insulin Detemir (LEVEMIR FLEXTOUCH) 100 UNIT/ML Pen Inject 0.8 mLs (80 Units total) into the skin every morning.   Insulin Pen Needle 31G X 8 MM MISC Use as directed   losartan (COZAAR) 50 MG tablet Take 0.5 tablets (25 mg total) by mouth daily.   lovastatin (MEVACOR) 40 MG tablet Take 1 tablet (40 mg total) by mouth at bedtime.   metFORMIN (GLUCOPHAGE-XR) 500 MG 24 hr tablet Take 500 mg by mouth daily with breakfast.    Multiple Vitamins-Minerals (MENS MULTIVITAMIN PLUS) TABS Take by mouth daily.          Review of Systems       Review of Systems  Constitutional: Negative for chills, fever and malaise/fatigue.  Cardiovascular: Positive for dyspnea on exertion and near-syncope. Negative for chest pain, leg swelling, orthopnea, palpitations and syncope.  Respiratory: Negative for cough, shortness of breath and wheezing.   Gastrointestinal: Negative for nausea and vomiting.  Neurological: Positive for light-headedness. Negative for dizziness and weakness.    All other systems reviewed and are otherwise negative except as noted above.   Physical Exam    VS:  BP 112/64 (BP Location: Left Arm, Patient Position: Sitting, Cuff Size: Normal)   Pulse 80   Ht 5\' 10"  (1.778 m)   Wt 214 lb 8 oz (97.3 kg)   SpO2 97%   BMI 30.78 kg/m  , BMI Body mass index is 30.78 kg/m. GEN: Well nourished, well developed, in no acute distress. HEENT: normal. Neck: Supple, no JVD, carotid bruits, or  masses. Cardiac: RRR, no, rubs, or gallops. Gr 2-3/6 systolic murmur. No clubbing, cyanosis, edema.  Radials/DP/PT 2+ and equal bilaterally.  Respiratory:  Respirations regular and unlabored, clear to auscultation bilaterally. GI: Soft, nontender, nondistended, BS + x 4. MS: No deformity or atrophy. Skin: Warm and dry, no rash. Neuro:  Strength and sensation are intact. Psych: Normal affect.   Assessment & Plan    Severe aortic stenosis - Noted by echo 08/2019 with valve area 0.84 cm^2 and mean gradient 52.74mmHg. Symptomatic with dyspnea and near-syncope.  Plan for [facility] for further evaluation of aortic stenosis. The patient understands that risks include but are not limited to stroke (1 in 1000), death (1 in 1000), kidney  failure [usually temporary] (1 in 500), bleeding (1 in 200), allergic reaction [possibly serious] (1 in 200), and agrees to proceed.  Discussed repair modalities of TAVR vs SAVR. Discussed that with his age, likely SAVR will be procedure of choice but await results of cath for referral to cardiothoracic surgery. We reviewed precautions with near-syncope including adequate hydration, slow position changes.   HTN - BP well controlled. Continue Losartan 25mg  daily per PCP.    DM2 - 04/2019 A1c of 8.0. Continue to follow with PCP.    HLD - 04/2019 lipid panel total cholesterol 133, triglycerides 89, HDL 49, LDL 67. Continue Lovastatin 40mg  daily.    Disposition: Follow up after cardiac catheterization with Dr. 01-31-1981 or APP    , NP 09/15/2019, 1:52 PM

## 2019-10-10 NOTE — Interval H&P Note (Signed)
History and Physical Interval Note:  10/10/2019 12:16 PM  Chase Saunders  has presented today for surgery, with the diagnosis of RT and LT Cath   Aortic valve stenosis, unstable angina.  The various methods of treatment have been discussed with the patient and family. After consideration of risks, benefits and other options for treatment, the patient has consented to  Procedure(s): RIGHT/LEFT HEART CATH AND CORONARY ANGIOGRAPHY (Bilateral) as a surgical intervention.  The patient's history has been reviewed, patient examined, no change in status, stable for surgery.  I have reviewed the patient's chart and labs.  Questions were answered to the patient's satisfaction.     Julien Nordmann

## 2019-10-10 NOTE — Interval H&P Note (Signed)
History and Physical Interval Note:  10/10/2019 12:19 PM  Chase Saunders  has presented today for surgery, with the diagnosis of RT and LT Cath   Aortic valve stenosis, unstable angina.  The various methods of treatment have been discussed with the patient and family. After consideration of risks, benefits and other options for treatment, the patient has consented to  Procedure(s): RIGHT/LEFT HEART CATH AND CORONARY ANGIOGRAPHY (Bilateral) as a surgical intervention.  The patient's history has been reviewed, patient examined, no change in status, stable for surgery.  I have reviewed the patient's chart and labs.  Questions were answered to the patient's satisfaction.     Julien Nordmann

## 2019-10-12 NOTE — Interval H&P Note (Signed)
History and Physical Interval Note:  10/12/2019 11:47 AM  Chase Saunders  has presented today for surgery, with the diagnosis of RT and LT Cath   Aortic valve stenosis, unstable angina.  The various methods of treatment have been discussed with the patient and family. After consideration of risks, benefits and other options for treatment, the patient has consented to  Procedure(s): RIGHT/LEFT HEART CATH AND CORONARY ANGIOGRAPHY (Bilateral) as a surgical intervention.  The patient's history has been reviewed, patient examined, no change in status, stable for surgery.  I have reviewed the patient's chart and labs.  Questions were answered to the patient's satisfaction.     Julien Nordmann

## 2019-10-12 NOTE — Interval H&P Note (Signed)
History and Physical Interval Note:  10/12/2019 11:17 AM  Chase Saunders  has presented today for surgery, with the diagnosis of RT and LT Cath   Aortic valve stenosis, unstable angina.  The various methods of treatment have been discussed with the patient and family. After consideration of risks, benefits and other options for treatment, the patient has consented to  Procedure(s): RIGHT/LEFT HEART CATH AND CORONARY ANGIOGRAPHY (Bilateral) as a surgical intervention.  The patient's history has been reviewed, patient examined, no change in status, stable for surgery.  I have reviewed the patient's chart and labs.  Questions were answered to the patient's satisfaction.     Julien Nordmann

## 2019-10-13 ENCOUNTER — Ambulatory Visit (INDEPENDENT_AMBULATORY_CARE_PROVIDER_SITE_OTHER): Payer: Self-pay | Admitting: Thoracic Surgery (Cardiothoracic Vascular Surgery)

## 2019-10-13 ENCOUNTER — Ambulatory Visit: Payer: Medicare HMO | Admitting: Family

## 2019-10-13 ENCOUNTER — Other Ambulatory Visit: Payer: Self-pay

## 2019-10-13 ENCOUNTER — Ambulatory Visit
Admission: RE | Admit: 2019-10-13 | Discharge: 2019-10-13 | Disposition: A | Discharge status: Home / Self Care | Payer: Medicare HMO | Source: Ambulatory Visit | Attending: Thoracic Surgery (Cardiothoracic Vascular Surgery) | Admitting: Thoracic Surgery (Cardiothoracic Vascular Surgery)

## 2019-10-13 VITALS — BP 110/65 | HR 85 | Temp 97.7°F | Resp 20 | Ht 70.0 in | Wt 210.0 lb

## 2019-10-13 DIAGNOSIS — I350 Nonrheumatic aortic (valve) stenosis: Secondary | ICD-10-CM

## 2019-10-13 DIAGNOSIS — Z951 Presence of aortocoronary bypass graft: Secondary | ICD-10-CM

## 2019-10-13 DIAGNOSIS — I25110 Atherosclerotic heart disease of native coronary artery with unstable angina pectoris: Secondary | ICD-10-CM

## 2019-10-13 DIAGNOSIS — J90 Pleural effusion, not elsewhere classified: Secondary | ICD-10-CM | POA: Diagnosis not present

## 2019-10-13 DIAGNOSIS — Z953 Presence of xenogenic heart valve: Secondary | ICD-10-CM

## 2019-10-13 NOTE — Patient Instructions (Addendum)
Continue all previous medications without any changes at this time  Check your weight on a regular basis and keep a log for your records.  Look for signs of fluid overload such as worsening swelling of your lower legs, increased shortness of breath with activity, and/or a dry nonproductive cough.  Discussed these findings with your cardiologist including whether or not you should adjust your fluid pill dosage (diuretic).  Continue to avoid any heavy lifting or strenuous use of your arms or shoulders for at least a total of three months from the time of surgery.  After three months you may gradually increase how much you lift or otherwise use your arms or chest as tolerated, with limits based upon whether or not activities lead to the return of significant discomfort.  You may return to driving an automobile as long as you are no longer requiring oral narcotic pain relievers during the daytime.  It would be wise to start driving only short distances during the daylight and gradually increase from there as you feel comfortable.  You are encouraged to enroll and participate in the outpatient cardiac rehab program beginning as soon as practical.  Endocarditis is a potentially serious infection of heart valves or inside lining of the heart.  It occurs more commonly in patients with diseased heart valves (such as patient's with aortic or mitral valve disease) and in patients who have undergone heart valve repair or replacement.  Certain surgical and dental procedures may put you at risk, such as dental cleaning, other dental procedures, or any surgery involving the respiratory, urinary, gastrointestinal tract, gallbladder or prostate gland.   To minimize your chances for develooping endocarditis, maintain good oral health and seek prompt medical attention for any infections involving the mouth, teeth, gums, skin or urinary tract.    Always notify your doctor or dentist about your underlying heart valve  condition before having any invasive procedures. You will need to take antibiotics before certain procedures, including all routine dental cleanings or other dental procedures.  Your cardiologist or dentist should prescribe these antibiotics for you to be taken ahead of time.  Following surgical aortic valve replacement using a bioprosthetic tissue valve ACC/AHA Heart Valve Guidelines suggest that baseline follow-up echocardiogram should be performed within 90 days of surgery.  Thereafter follow-up echocardiograms should be performed at 5 and 10 years and then annually in the absence of clinical problems or technical concerns.

## 2019-10-13 NOTE — Progress Notes (Signed)
[address]       [name] [address]             [phone]     CARDIOTHORACIC SURGERY OFFICE NOTE  Primary Cardiologist is Julien Nordmann, MD PCP is Clovis Riley, L.[name], MD   HPI:  Patient is a 69 year old male with history of aortic stenosis, hypertension, hyperlipidemia, and type 2 diabetes mellitus who underwent aortic valve replacement using bioprosthetic tissue valve and coronary artery bypass grafting x2 on September 24, 2019 for severe symptomatic aortic stenosis with left main coronary artery disease and unstable angina pectoris.  Patient's early postoperative recovery has been uncomplicated and he was seen in our office last week because of some drainage from one of his chest tube incision sites.  His blood pressure was borderline low at the time and metoprolol dose was decreased.  Lasix was resumed because of fluid overload.  His incisions have been treated with local wound care and he returns to our office today for routine follow-up.  He states that he continues to gradually improve.  He has not been taking any sort of pain relievers and reports only mild residual soreness along the right side of his chest.  He is sleeping well at night.  Appetite is good.  He is walking every day.  He has no shortness of breath.  Over the past week his weight has gradually decreased further and lower extremity swelling has resolved.  Overall he is pleased with his progress.   Current Outpatient Medications  Medication Sig Dispense Refill   acetaminophen (TYLENOL) 500 MG tablet Take 2 tablets (1,000 mg total) by mouth every 6 (six) hours as needed. 30 tablet 0   aspirin EC 81 MG tablet Take 81 mg by mouth at bedtime.      CINNAMON PO Take 200 mcg by mouth daily.     citalopram (CELEXA) 20 MG tablet TAKE 1 TABLET BY MOUTH EVERY DAY (Patient taking differently: Take 20 mg by mouth daily. ) 30 tablet 2   clopidogrel (PLAVIX) 75 MG tablet Take 1 tablet (75 mg total) by mouth  daily. 30 tablet 3   Insulin Pen Needle 31G X 8 MM MISC Use as directed 100 each 3   LANTUS SOLOSTAR 100 UNIT/ML Solostar Pen Inject 10-16 Units into the skin daily. Take 20 mg in the morning and 15 mg at bedtime     losartan (COZAAR) 50 MG tablet Take 0.5 tablets (25 mg total) by mouth daily. 30 tablet 0   lovastatin (MEVACOR) 40 MG tablet Take 1 tablet (40 mg total) by mouth at bedtime. 90 tablet 3   metFORMIN (GLUCOPHAGE-XR) 500 MG 24 hr tablet Take 500 mg by mouth at bedtime.      metoprolol tartrate (LOPRESSOR) 25 MG tablet Take 0.5 tablets (12.5 mg total) by mouth 2 (two) times daily. 30 tablet 1   Multiple Vitamins-Minerals (MENS MULTIVITAMIN PLUS) TABS Take by mouth daily.     nitroGLYCERIN (NITROSTAT) 0.4 MG SL tablet Place 1 tablet (0.4 mg total) under the tongue every 5 (five) minutes as needed for chest pain. 25 tablet 3   sitaGLIPtin (JANUVIA) 100 MG tablet Take 1 tablet (100 mg total) by mouth daily. 30 tablet 3   furosemide (LASIX) 40 MG tablet Take 1 tablet (40 mg total) by mouth daily. For one week (Patient not taking: Reported on 10/13/2019) 14 tablet 0   potassium chloride (KLOR-CON) 10 MEQ tablet Take 1 tablet (10 mEq total) by mouth  daily. For one week (Patient not taking: Reported on 10/13/2019) 14 tablet 1   No current facility-administered medications for this visit.      Physical Exam:   BP 110/65    Pulse 85    Temp 97.7 F (36.5 C) (Skin)    Resp 20    Ht 5\' 10"  (1.778 m)    Wt 210 lb (95.3 kg)    SpO2 98% Comment: RA   BMI 30.13 kg/m   General:  Well-appearing  Chest:   Clear to auscultation with slightly diminished breath sounds left lung base  CV:   Regular rate and rhythm  Incisions:  Clean dry healing nicely.  Middle chest tube incision is open but clean and healing without sign of infection  Abdomen:  Soft nontender  Extremities:  Warm and well-perfused, no edema  Diagnostic Tests:  CHEST - 2 VIEW  COMPARISON:  10/06/2019  FINDINGS: Normal  heart size. Small bilateral pleural effusions are again noted, left greater than right. This is unchanged when compared with 10/06/2019. No airspace opacities identified. No interstitial edema.  IMPRESSION: Persistent small bilateral pleural effusions, left greater than right.   Electronically Signed   By: 12/06/2019 M.D.   On: 10/13/2019 09:38    Impression:  Patient is doing well just 3 weeks following aortic valve replacement and coronary artery bypass grafting.  Plan:  We have not recommended any change the patient's current medications.  I suggested that he remain on daily Lasix for the time being and continue to monitor his weight.  It is possible at some point he may be able to stop taking diuretic therapy or switch over to HydroDIURIL.  I have encouraged the patient to continue to gradually increase his physical activity without any particular limitations other than the fact that he should continue to refrain from any sort of heavy lifting or strenuous use of his arms or shoulders for at least another 2 months.  I think he may resume driving an automobile and I have encouraged him to begin participation in the cardiac rehab program.  The patient has been reminded regarding the importance of dental hygiene and the lifelong need for antibiotic prophylaxis for all dental cleanings and other related invasive procedures.  All of his questions have been addressed.  The patient will return to our office in approximately 4 weeks for routine follow-up and repeat chest x-ray.    12/13/2019. Salvatore Decent, MD 10/13/2019 9:54 AM

## 2019-10-16 ENCOUNTER — Encounter: Payer: Self-pay | Admitting: Family

## 2019-10-17 ENCOUNTER — Ambulatory Visit: Payer: Medicare HMO | Admitting: Family

## 2019-10-17 ENCOUNTER — Encounter: Payer: Self-pay | Admitting: Family

## 2019-10-17 MED ORDER — POTASSIUM CHLORIDE CRYS ER 10 MEQ PO TBCR: 10.0000 meq | EXTENDED_RELEASE_TABLET | Freq: Every day | ORAL | 1 refills | Status: DC

## 2019-10-17 MED ORDER — FUROSEMIDE 40 MG PO TABS
40.0000 mg | ORAL_TABLET | Freq: Every day | ORAL | 1 refills | Status: DC
Start: 2019-10-17 — End: 2019-10-31

## 2019-10-20 ENCOUNTER — Encounter: Payer: Self-pay | Admitting: Family

## 2019-10-23 DIAGNOSIS — F411 Generalized anxiety disorder: Secondary | ICD-10-CM | POA: Diagnosis not present

## 2019-10-23 DIAGNOSIS — E1169 Type 2 diabetes mellitus with other specified complication: Secondary | ICD-10-CM | POA: Diagnosis not present

## 2019-10-23 DIAGNOSIS — Z7984 Long term (current) use of oral hypoglycemic drugs: Secondary | ICD-10-CM | POA: Diagnosis not present

## 2019-10-23 DIAGNOSIS — E7800 Pure hypercholesterolemia, unspecified: Secondary | ICD-10-CM | POA: Diagnosis not present

## 2019-10-23 DIAGNOSIS — I10 Essential (primary) hypertension: Secondary | ICD-10-CM | POA: Diagnosis not present

## 2019-10-24 ENCOUNTER — Encounter: Payer: Self-pay | Admitting: Family

## 2019-10-24 DIAGNOSIS — I10 Essential (primary) hypertension: Secondary | ICD-10-CM

## 2019-10-28 ENCOUNTER — Other Ambulatory Visit
Admission: RE | Admit: 2019-10-28 | Discharge: 2019-10-28 | Disposition: A | Discharge status: Home / Self Care | Payer: Medicare HMO | Attending: Family | Admitting: Family

## 2019-10-28 ENCOUNTER — Other Ambulatory Visit: Payer: Self-pay

## 2019-10-28 ENCOUNTER — Ambulatory Visit (INDEPENDENT_AMBULATORY_CARE_PROVIDER_SITE_OTHER): Payer: Medicare HMO

## 2019-10-28 DIAGNOSIS — I350 Nonrheumatic aortic (valve) stenosis: Secondary | ICD-10-CM

## 2019-10-28 DIAGNOSIS — I10 Essential (primary) hypertension: Secondary | ICD-10-CM | POA: Insufficient documentation

## 2019-10-28 LAB — BASIC METABOLIC PANEL
Anion gap: 10 (ref 5–15)
BUN: 13 mg/dL (ref 8–23)
CO2: 26 mmol/L (ref 22–32)
Calcium: 8.6 mg/dL — ABNORMAL LOW (ref 8.9–10.3)
Chloride: 101 mmol/L (ref 98–111)
Creatinine, Ser: 0.84 mg/dL (ref 0.61–1.24)
GFR calc Af Amer: >60 mL/min (ref >60)
GFR calc non Af Amer: >60 mL/min (ref >60)
Glucose, Bld: 247 mg/dL — ABNORMAL HIGH (ref 70–99)
Potassium: 4.1 mmol/L (ref 3.5–5.1)
Sodium: 137 mmol/L (ref 135–145)

## 2019-10-29 LAB — ECHOCARDIOGRAM COMPLETE
AR max vel: 1.81 cm2
AV Area VTI: 1.96 cm2
AV Area mean vel: 1.72 cm2
AV Mean grad: 9 mmHg
AV Peak grad: 17.3 mmHg
Ao pk vel: 2.08 m/s
Area-P 1/2: 4.06 cm2
Calc EF: 48.9 %
S' Lateral: 3.5 cm
Single Plane A2C EF: 49.7 %
Single Plane A4C EF: 50.3 %

## 2019-10-31 ENCOUNTER — Ambulatory Visit (INDEPENDENT_AMBULATORY_CARE_PROVIDER_SITE_OTHER): Payer: Medicare HMO | Admitting: Family

## 2019-10-31 ENCOUNTER — Other Ambulatory Visit: Payer: Self-pay

## 2019-10-31 ENCOUNTER — Encounter: Payer: Self-pay | Admitting: Family

## 2019-10-31 VITALS — BP 112/64 | HR 90 | Ht 70.0 in | Wt 205.1 lb

## 2019-10-31 DIAGNOSIS — I350 Nonrheumatic aortic (valve) stenosis: Secondary | ICD-10-CM

## 2019-10-31 DIAGNOSIS — E1165 Type 2 diabetes mellitus with hyperglycemia: Secondary | ICD-10-CM

## 2019-10-31 DIAGNOSIS — Z794 Long term (current) use of insulin: Secondary | ICD-10-CM | POA: Diagnosis not present

## 2019-10-31 DIAGNOSIS — Z951 Presence of aortocoronary bypass graft: Secondary | ICD-10-CM | POA: Diagnosis not present

## 2019-10-31 DIAGNOSIS — E785 Hyperlipidemia, unspecified: Secondary | ICD-10-CM | POA: Diagnosis not present

## 2019-10-31 DIAGNOSIS — I10 Essential (primary) hypertension: Secondary | ICD-10-CM

## 2019-10-31 DIAGNOSIS — Z952 Presence of prosthetic heart valve: Secondary | ICD-10-CM

## 2019-10-31 DIAGNOSIS — I25118 Atherosclerotic heart disease of native coronary artery with other forms of angina pectoris: Secondary | ICD-10-CM | POA: Diagnosis not present

## 2019-10-31 DIAGNOSIS — R42 Dizziness and giddiness: Secondary | ICD-10-CM

## 2019-10-31 MED ORDER — FUROSEMIDE 40 MG PO TABS
20.0000 mg | ORAL_TABLET | Freq: Every day | ORAL | 1 refills | Status: DC
Start: 2019-10-31 — End: 2019-12-24

## 2019-10-31 MED ORDER — AMOXICILLIN 500 MG PO TABS
2000.0000 mg | ORAL_TABLET | Freq: Once | ORAL | 0 refills | Status: AC
Start: 2019-10-31 — End: 2019-10-31

## 2019-10-31 MED ORDER — POTASSIUM CHLORIDE CRYS ER 10 MEQ PO TBCR: 10.0000 meq | EXTENDED_RELEASE_TABLET | ORAL | 1 refills | Status: DC

## 2019-10-31 NOTE — Progress Notes (Signed)
Office Visit    Patient Name: Chase Saunders Date of Encounter: 10/31/2019  Primary Care Provider:  [name], SaundersAugust Saucerean, Saunders Primary Cardiologist:  Julien Nordmannimothy Gollan, Saunders Electrophysiologist:  None   Chief Complaint    Chase Saunders Vines is a 69 y.o. male with a hx of severe aortic stenosis, remote tobacco use, DM, obesity, HTN presents today for follow up after AVR.  Past Medical History    Past Medical History:  Diagnosis Date  . Arthritis   . Asthma    Childhood  . Cataract   . Coronary artery disease   . Diabetes mellitus without complication (HCC)    Type II  . Heart murmur   . Hyperlipidemia   . Hypertension   . S/P aortic valve replacement with bioprosthetic valve 09/24/2019   Size 23 mm Edwards Inspiris Resilia  . 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 Severe aortic valve stenosis   . Shingles 09/2019   Past Surgical History:  Procedure Laterality Date  . AORTIC VALVE REPLACEMENT N/A 09/24/2019   Procedure: AORTIC VALVE REPLACEMENT (AVR) using Cyndee BrightlyEdwards INSPIRIS Resilia 23 MM Aortic Valve;  Surgeon: Purcell Nailswen, Clarence H, Saunders;  [facility];  Service: Open Heart Surgery;  Laterality: N/A;  . CARDIAC CATHETERIZATION    . COLONOSCOPY    . CORONARY ARTERY BYPASS GRAFT N/A 09/24/2019   Procedure: CORONARY ARTERY BYPASS GRAFTING (CABG) using LIMA to LAD; Endoscopic harvested right greater saphenous vein: SVG to OM2;  Surgeon: Purcell Nailswen, Clarence H, Saunders;  [facility];  Service: Open Heart Surgery;  Laterality: N/A;  . ENDOVEIN HARVEST OF GREATER SAPHENOUS VEIN Right 09/24/2019   Procedure: ENDOVEIN HARVEST OF GREATER SAPHENOUS VEIN;  Surgeon: Purcell Nailswen, Clarence H, Saunders;  [facility];  Service: Open Heart Surgery;  Laterality: Right;  . RIGHT/LEFT HEART CATH AND CORONARY ANGIOGRAPHY Bilateral 09/19/2019   Procedure: RIGHT/LEFT HEART CATH AND CORONARY ANGIOGRAPHY;  Surgeon: Antonieta IbaGollan, Chase Saunders;  Location: [facility] INVASIVE CV LAB;  Service: Cardiovascular;  Laterality:  Bilateral;  . TEE WITHOUT CARDIOVERSION N/A 09/24/2019   Procedure: TRANSESOPHAGEAL ECHOCARDIOGRAM (TEE);  Surgeon: Purcell Nailswen, Clarence H, Saunders;  [facility];  Service: Open Heart Surgery;  Laterality: N/A;  . TONSILLECTOMY AND ADENOIDECTOMY  1969  . WISDOM TOOTH EXTRACTION     Allergies  No Known Allergies  History of Present Illness    Chase Saunders Chase Saunders is a 69 y.o. male with a hx of severe aortic stenosis, remote tobacco use, DM, obesity, HTN. He was last seen in clinic 09/15/19 with subsequent cardiac cath 09/19/19 and admission for AVR 09/24/19.  He works at [employer]. He was previously seen in 2015 with moderate aortic valve stenosis. Echo at that time with AV mean gradient 24mmHg and valve area 1.28 cm^2.  When seen in clinic 06/23/19 he reported lightheadedness, fatigue. Underwent echocardiogram 08/25/19 showing severe aortic stenosis (AV area 0.84 cm, AV mean gradient 52.135mmHg), EF 60-65%, no RWMA, mild LVH, gr1DD, RV normal size and function  Seen in clinic 09/24/19 nothing fatigue, dyspnea, diaphoresis, and near syncope. He was recommended for cardiac catheterization for further investigation of his aortic stenosis. L/RHC 09/19/19 with single vessel disease of LAD with otherwise mild luminal irregularities of LCx and RCA with severe AS mean gradient 60mmHg. He was referred to cardiothoracic surgery.   Underwent CABGx2 (LIMA-distal LAD, SVG-second obtuse marginal branch of LCx) and bioprosthetic AVR on 09/24/19 with [facility]. Postoperative course was unremarkable. He was discharged on  Januvia as well as short course of Lasix in addition to BB, ARB, statin.   Seen by TCTS PA for some drainage from incision 8/2/2. Recommended to cleanse with soap/water and cover with dry gauze. BP was labile and Metoprolol dose decreased. He was given Lasix and Potassium for one week. Seen by [name] 10/13/19.  He has post-op echo 10/28/19 with EF 50-55%, no RWMA, LV midlly dilated, gr2DD, mild LVH, RVSF mildly  reduced, bilateral atrial mildly dilated, normal structure and function of AV prosthesis with mean gradient 9.  Presents today for follow up. His walks he is doing every day for exercise are starting to get earlier. Does note lightheadedness with position changes, likely orthostatic hypotension. No near syncope nor syncope. Reports some pain in his left arm when he lays a certain way, non exertional, reassurance provided.    No shortness of breath at rest. Only mild dyspnea on exertion. No LE edema, no PND, no orthopnea. Weighing daily at home and reports weigh gradually trending down. Preoperative he weight 214lbs in our clinic and now down to 205 lbs.   He has seen his primary care and they are following his blood sugar carefully.   EKGs/Labs/Other Studies Reviewed:   The following studies were reviewed today: Echo 10/28/19   1. Left ventricular ejection fraction, by estimation, is 50 to 55%. The  left ventricle has low normal function. The left ventricle has no regional  wall motion abnormalities. The left ventricular internal cavity size was  mildly dilated. There is mild  left ventricular hypertrophy. Left ventricular diastolic parameters are  consistent with Grade II diastolic dysfunction (pseudonormalization).  Elevated left atrial pressure. The average left ventricular global  longitudinal strain is -13.9 %.   2. Right ventricular systolic function is mildly reduced. The right  ventricular size is normal. There is normal pulmonary artery systolic  pressure.   3. Left atrial size was mildly dilated.   4. Right atrial size was mildly dilated.   5. The mitral valve is abnormal. Moderate mitral valve regurgitation. No  evidence of mitral stenosis.   6. The aortic valve has been repaired/replaced. Aortic valve  regurgitation is not visualized. There is a 23 mm Edwards Inspiris Resilia  bovine valve present in the aortic position. Procedure Date: 09/24/19. Echo  findings are consistent  with normal  structure and function of the aortic valve prosthesis. Aortic valve mean  gradient measures 9.0 mmHg.   7. The inferior vena cava is normal in size with greater than 50%  respiratory variability, suggesting right atrial pressure of 3 mmHg.   RIGHT/LEFT HEART CATH AND CORONARY ANGIOGRAPHY 09/19/19  Conclusion    Mid LAD lesion is 75% stenosed.  Ost LM lesion is 40% stenosed.  Ost LAD to Prox LAD lesion is 40% stenosed.  The left ventricular ejection fraction is 55-65% by visual estimate.  LV end diastolic pressure is normal.  The left ventricular systolic function is normal.  There is no mitral valve regurgitation.  There is severe aortic valve stenosis.  Echo 08/25/2019  1. Left ventricular ejection fraction, by estimation, is 60 to 65%. The  left ventricle has normal function. The left ventricle has no regional  wall motion abnormalities. There is mild left ventricular hypertrophy.  Left ventricular diastolic parameters  are consistent with Grade I diastolic dysfunction (impaired relaxation).   2. Right ventricular systolic function is normal. The right ventricular  size is normal. There is normal pulmonary artery systolic pressure.   3. The mitral  valve is normal in structure. No evidence of mitral valve  regurgitation. No evidence of mitral stenosis.   4. The aortic valve is abnormal. Aortic valve regurgitation is mild.  Severe aortic valve stenosis. Aortic valve area, by VTI measures 0.84 cm.  Aortic valve mean gradient measures 52.5 mmHg.   Comparison(s): Previous Echo showed LV EF 55-60%, no RWMA, grade I  diastolic dysfunction. Moderate AS, severely calcified valves, could not  exclude bicuspid morphology, mean gradient 24 mmHg.   EKG:  EKG is ordered today.  The ekg ordered today demonstrates SR 90 bpm with LA enlargement (mild by echo), stable anterolateral TWI and stable TWI II,III compared to EkG 09/24/19,   Recent Labs: 09/22/2019: ALT  33 09/25/2019: Magnesium 2.5 09/29/2019: Hemoglobin 9.8; Platelets 268 10/28/2019: BUN 13; Creatinine, Ser 0.84; Potassium 4.1; Sodium 137  Recent Lipid Panel    Component Value Date/Time   CHOL 78 09/26/2019 0302   TRIG 67 09/26/2019 0302   HDL 28 (L) 09/26/2019 0302   CHOLHDL 2.8 09/26/2019 0302   VLDL 13 09/26/2019 0302   LDLCALC 37 09/26/2019 0302    Home Medications   Current Meds  Medication Sig  . acetaminophen (TYLENOL) 500 MG tablet Take 2 tablets (1,000 mg total) by mouth every 6 (six) hours as needed.  Marland Kitchen aspirin EC 81 MG tablet Take 81 mg by mouth at bedtime.   Marland Kitchen CINNAMON PO Take 200 mcg by mouth daily.  . citalopram (CELEXA) 20 MG tablet Take 20 mg by mouth daily.  . clopidogrel (PLAVIX) 75 MG tablet Take 1 tablet (75 mg total) by mouth daily.  . furosemide (LASIX) 40 MG tablet Take 0.5 tablets (20 mg total) by mouth daily.  . Insulin Pen Needle 31G X 8 MM MISC Use as directed  . LANTUS SOLOSTAR 100 UNIT/ML Solostar Pen Inject 10-16 Units into the skin daily. Take 20 mg in the morning and 15 mg at bedtime  . losartan (COZAAR) 50 MG tablet Take 0.5 tablets (25 mg total) by mouth daily.  Marland Kitchen lovastatin (MEVACOR) 40 MG tablet Take 1 tablet (40 mg total) by mouth at bedtime.  . metFORMIN (GLUCOPHAGE-XR) 500 MG 24 hr tablet Take 500 mg by mouth at bedtime.   . metoprolol tartrate (LOPRESSOR) 25 MG tablet Take 0.5 tablets (12.5 mg total) by mouth 2 (two) times daily.  . Multiple Vitamins-Minerals (MENS MULTIVITAMIN PLUS) TABS Take by mouth daily.  . nitroGLYCERIN (NITROSTAT) 0.4 MG SL tablet Place 1 tablet (0.4 mg total) under the tongue every 5 (five) minutes as needed for chest pain.  . potassium chloride (KLOR-CON) 10 MEQ tablet Take 1 tablet (10 mEq total) by mouth 3 (three) times a week.  . sitaGLIPtin (JANUVIA) 100 MG tablet Take 1 tablet (100 mg total) by mouth daily.  . [DISCONTINUED] furosemide (LASIX) 40 MG tablet Take 1 tablet (40 mg total) by mouth daily.  .  [DISCONTINUED] potassium chloride (KLOR-CON) 10 MEQ tablet Take 1 tablet (10 mEq total) by mouth daily.    Review of Systems    Review of Systems  Constitutional: Negative for chills, fever and malaise/fatigue.  Cardiovascular: Positive for dyspnea on exertion. Negative for chest pain, irregular heartbeat, leg swelling, near-syncope, orthopnea, palpitations and syncope.  Respiratory: Negative for cough, shortness of breath and wheezing.   Musculoskeletal:       (+)L arm pain when laying  Gastrointestinal: Negative for melena, nausea and vomiting.  Genitourinary: Negative for hematuria.  Neurological: Negative for dizziness, light-headedness and weakness.   All  other systems reviewed and are otherwise negative except as noted above.  Physical Exam   VS:  BP 112/64 (BP Location: Left Arm, Patient Position: Sitting, Cuff Size: Normal)   Pulse 90   Ht 5\' 10"  (1.778 m)   Wt 205 lb 2 oz (93 kg)   SpO2 95%   BMI 29.43 kg/m  , BMI Body mass index is 29.43 kg/m. GEN: Well nourished, well developed, in no acute distress. HEENT: normal. Neck: Supple, no JVD, carotid bruits, or masses. Cardiac: RRR, no, rubs, murmur, or gallops. No clubbing, cyanosis, edema.  Radials/DP/PT 2+ and equal bilaterally.  Respiratory:  Respirations regular and unlabored, clear to auscultation bilaterally. GI: Soft, nontender, nondistended. MS: No deformity or atrophy. Skin: Warm and dry, no rash.Midsternal incision healing appropriately without infection, edges well approximated. One of his chest tube insertion sites has unapproximated edges, no signs of infection, and is appropriately covered with dry bandage. His RLE vein graft sites are healing appropriately without signs of infection.  Neuro:  Strength and sensation are intact. Psych: Normal affect.  Assessment & Plan    1. Severe aortic stenosis s/p AVR- Noted by echo 08/2019 with valve area 0.84 cm^2 and mean gradient 52.51mmHg. S/p AVR with Dr. 4m. Post  operative echo with valve functioning appropriately, normal LVEF, gr2DD. Discussed SBE prophylaxis, Rx sent to pharmacy to have on hand. Incisions healing appropriately - we discussed that his wound healing would likely take time due to his diabetes.   2. CAD s/p CABG x2 - Stable with no anginal symptoms. TWI noted in anterolateral leads which was also present on postoperative EKG. As he is without anginal symptom, likely stable post CABG finding. GDMT includes aspirin, plavix, statin, beta blocker. He is planning to participate in cardiac rehab.   3. HTN - BP low normal with some lightheadedness with position changes, likely orthostasis. Reduce Lasix to 20mg  daily and Potassium to Cornelius Moras to 3 times per week. Due to grade 2 diastolic dysfunction on recent echo anticipate he need some diuretic. BMP 10/28/19 with K 4.1, creatinine 0.84, GFR >60. If BP is persistently low, consider reduced dose Losartan to 12.5mg . Metoprolol dose previously reduced.   4. DM2 - 04/2019 A1c of 8.0. Continue to follow with PCP.   5. HLD -  09/26/19 LDL 37. Continue Lovastatin 40mg  daily.   Disposition: Follow up in 2 month(s) with Dr. 01-31-1981 or APP   09/28/19, NP 10/31/2019, 2:24 PM

## 2019-10-31 NOTE — Patient Instructions (Addendum)
Medication Instructions:  Your physician has recommended you make the following change in your medication:  ONE TIME ONLY Take amoxicillin 4 tablets (2,000 mg total) by mouth once for 1 dose. Take 1 hour prior to dental procedure  CHANGE Furosemide (Lasix) to 20mg  daily (half tablet daily)  CHANGE Potassium to three times per week  *If you need a refill on your cardiac medications before your next appointment, please call your pharmacy*  Lab Work: No lab work today  Testing/Procedures: Your echocardiogram earlier this week showed your aortic valve replacement is working appropriately! Your heart pumping function was normal. Your heart was a bit stiff which can contribute to hanging onto extra fluid. The Furosemide will help get rid of any extra fluid.   Follow-Up: At [facility], you and your health needs are our priority.  As part of our continuing mission to provide you with exceptional heart care, we have created designated Provider Care Teams.  These Care Teams include your primary Cardiologist (physician) and Advanced Practice Providers (APPs -  Physician Assistants and Nurse Practitioners) who all work together to provide you with the care you need, when you need it.  We recommend signing up for the patient portal called "MyChart".  Sign up information is provided on this After Visit Summary.  MyChart is used to connect with patients for Virtual Visits (Telemedicine).  Patients are able to view lab/test results, encounter notes, upcoming appointments, etc.  Non-urgent messages can be sent to your provider as well.   To learn more about what you can do with MyChart, go to [facility].    Your next appointment:   2 month(s)  The format for your next appointment:   In Person  Provider:   You may see [email or web address], MD or one of the following Advanced Practice Providers on your designated Care Team:    Julien Nordmann, NP  Nicolasa Ducking, PA-C  Eula Listen,  PA-C  Marisue Ivan, NP  Other Instructions   Your incisions all look good! They will heal a bit more slowly because you are diabetic. Recommend keeping them covered.    Call our office if you gain 2 lbs overnight or 5 lbs in one week. This would be sign that you are holding onto extra fluid.   Orthostatic Hypotension Blood pressure is a measurement of how strongly, or weakly, your blood is pressing against the walls of your arteries. Orthostatic hypotension is a sudden drop in blood pressure that happens when you quickly change positions, such as when you get up from sitting or lying down. Arteries are blood vessels that carry blood from your heart throughout your body. When blood pressure is too low, you may not get enough blood to your brain or to the rest of your organs. This can cause weakness, light-headedness, rapid heartbeat, and fainting. This can last for just a few seconds or for up to a few minutes. Orthostatic hypotension is usually not a serious problem. However, if it happens frequently or gets worse, it may be a sign of something more serious. What are the causes? This condition may be caused by:  Sudden changes in posture, such as standing up quickly after you have been sitting or lying down.  Blood loss.  Loss of body fluids (dehydration).  Heart problems.  Hormone (endocrine) problems.  Pregnancy.  Severe infection.  Lack of certain nutrients.  Severe allergic reactions (anaphylaxis).  Certain medicines, such as blood pressure medicine or medicines that make the body lose excess  fluids (diuretics). Sometimes, this condition can be caused by not taking medicine as directed, such as taking too much of a certain medicine. What increases the risk? The following factors may make you more likely to develop this condition:  Age. Risk increases as you get older.  Conditions that affect the heart or the central nervous system.  Taking certain medicines, such as  blood pressure medicine or diuretics.  Being pregnant. What are the signs or symptoms? Symptoms of this condition may include:  Weakness.  Light-headedness.  Dizziness.  Blurred vision.  Fatigue.  Rapid heartbeat.  Fainting, in severe cases. How is this diagnosed? This condition is diagnosed based on:  Your medical history.  Your symptoms.  Your blood pressure measurement. Your health care provider will check your blood pressure when you are: ? Lying down. ? Sitting. ? Standing. A blood pressure reading is recorded as two numbers, such as "120 over 80" (or 120/80). The first ("top") number is called the systolic pressure. It is a measure of the pressure in your arteries as your heart beats. The second ("bottom") number is called the diastolic pressure. It is a measure of the pressure in your arteries when your heart relaxes between beats. Blood pressure is measured in a unit called mm Hg. Healthy blood pressure for most adults is 120/80. If your blood pressure is below 90/60, you may be diagnosed with hypotension. Other information or tests that may be used to diagnose orthostatic hypotension include:  Your other vital signs, such as your heart rate and temperature.  Blood tests.  Tilt table test. For this test, you will be safely secured to a table that moves you from a lying position to an upright position. Your heart rhythm and blood pressure will be monitored during the test. How is this treated? This condition may be treated by:  Changing your diet. This may involve eating more salt (sodium) or drinking more water.  Taking medicines to raise your blood pressure.  Changing the dosage of certain medicines you are taking that might be lowering your blood pressure.  Wearing compression stockings. These stockings help to prevent blood clots and reduce swelling in your legs. In some cases, you may need to go to the hospital for:  Fluid replacement. This means you will  receive fluids through an IV.  Blood replacement. This means you will receive donated blood through an IV (transfusion).  Treating an infection or heart problems, if this applies.  Monitoring. You may need to be monitored while medicines that you are taking wear off. Follow these instructions at home: Eating and drinking   Drink enough fluid to keep your urine pale yellow.  Eat a healthy diet, and follow instructions from your health care provider about eating or drinking restrictions. A healthy diet includes: ? Fresh fruits and vegetables. ? Whole grains. ? Lean meats. ? Low-fat dairy products.  Eat extra salt only as directed. Do not add extra salt to your diet unless your health care provider told you to do that.  Eat frequent, small meals.  Avoid standing up suddenly after eating. Medicines  Take over-the-counter and prescription medicines only as told by your health care provider. ? Follow instructions from your health care provider about changing the dosage of your current medicines, if this applies. ? Do not stop or adjust any of your medicines on your own. General instructions   Wear compression stockings as told by your health care provider.  Get up slowly from lying down or  sitting positions. This gives your blood pressure a chance to adjust.  Avoid hot showers and excessive heat as directed by your health care provider.  Return to your normal activities as told by your health care provider. Ask your health care provider what activities are safe for you.  Do not use any products that contain nicotine or tobacco, such as cigarettes, e-cigarettes, and chewing tobacco. If you need help quitting, ask your health care provider.  Keep all follow-up visits as told by your health care provider. This is important. Contact a health care provider if you:  Vomit.  Have diarrhea.  Have a fever for more than 2-3 days.  Feel more thirsty than usual.  Feel weak and  tired. Get help right away if you:  Have chest pain.  Have a fast or irregular heartbeat.  Develop numbness in any part of your body.  Cannot move your arms or your legs.  Have trouble speaking.  Become sweaty or feel light-headed.  Faint.  Feel short of breath.  Have trouble staying awake.  Feel confused. Summary  Orthostatic hypotension is a sudden drop in blood pressure that happens when you quickly change positions.  Orthostatic hypotension is usually not a serious problem.  It is diagnosed by having your blood pressure taken lying down, sitting, and then standing.  It may be treated by changing your diet or adjusting your medicines. This information is not intended to replace advice given to you by your health care provider. Make sure you discuss any questions you have with your health care provider. Document Revised: 08/16/2017 Document Reviewed: 08/16/2017 Elsevier Patient Education  2020 [employer].

## 2019-11-06 ENCOUNTER — Encounter: Payer: Self-pay | Admitting: Family

## 2019-11-14 ENCOUNTER — Other Ambulatory Visit: Payer: Self-pay | Admitting: Thoracic Surgery (Cardiothoracic Vascular Surgery)

## 2019-11-14 DIAGNOSIS — Z951 Presence of aortocoronary bypass graft: Secondary | ICD-10-CM

## 2019-11-17 ENCOUNTER — Ambulatory Visit (INDEPENDENT_AMBULATORY_CARE_PROVIDER_SITE_OTHER): Payer: Self-pay | Admitting: Surgical

## 2019-11-17 ENCOUNTER — Encounter: Payer: Self-pay | Admitting: Surgical

## 2019-11-17 ENCOUNTER — Other Ambulatory Visit: Payer: Self-pay

## 2019-11-17 ENCOUNTER — Ambulatory Visit
Admission: RE | Admit: 2019-11-17 | Discharge: 2019-11-17 | Disposition: A | Discharge status: Home / Self Care | Payer: Medicare HMO | Source: Ambulatory Visit | Attending: Thoracic Surgery (Cardiothoracic Vascular Surgery) | Admitting: Thoracic Surgery (Cardiothoracic Vascular Surgery)

## 2019-11-17 VITALS — BP 110/69 | HR 86 | Temp 97.7°F | Resp 20 | Ht 70.0 in | Wt 211.0 lb

## 2019-11-17 DIAGNOSIS — Z951 Presence of aortocoronary bypass graft: Secondary | ICD-10-CM

## 2019-11-17 DIAGNOSIS — Z952 Presence of prosthetic heart valve: Secondary | ICD-10-CM | POA: Diagnosis not present

## 2019-11-17 DIAGNOSIS — J90 Pleural effusion, not elsewhere classified: Secondary | ICD-10-CM | POA: Diagnosis not present

## 2019-11-17 DIAGNOSIS — Z953 Presence of xenogenic heart valve: Secondary | ICD-10-CM

## 2019-11-17 NOTE — Assessment & Plan Note (Signed)
Doing well 

## 2019-11-17 NOTE — Progress Notes (Signed)
[address]       [place] [address]             [phone]      [facility] Medical Record #[id number] Date of Birth: 07/07/1950  Referring: Antonieta Iba, MD Primary Care: Clovis Riley, Elbert Ewings.[name], MD Primary Cardiologist: Julien Nordmann, MD   Chief Complaint:   POST OP FOLLOW UP CARDIOTHORACIC SURGERY OPERATIVE NOTE  Date of Procedure:                09/24/2019  Preoperative Diagnosis:       ? Severe Aortic Stenosis ? Severe Left Main Coronary Artery Disease ? Unstable Angina Pectoris  Postoperative Diagnosis:    Same  Procedure:        Aortic Valve Replacement             Edwards Inspiris Resilia Stented Bovine Pericardial Tissue Valve (size 75mm, ref # [id number], serial # [id number])   Coronary Artery Bypass Grafting x 2             Left Internal Mammary Artery to Distal Left Anterior Descending Coronary Artery             Saphenous Vein Graft to Second Obtuse Marginal Branch of Left Circumflex Coronary Artery             Endoscopic Vein Harvest from Right Thigh   Surgeon:        Salvatore Decent. Cornelius Moras, MD  Assistant:       [name], PA-C  Anesthesia:    Arta Bruce, MD  Operative Findings: ? Severe aortic stenosis ? Normal left ventricular systolic function ? Good quality left internal mammary artery conduit ? Good quality saphenous vein conduit ? Good quality target vessels for grafting   History of Present Illness:    The patient is a 69 year old male status post the above described procedure seen in the office on today's date and routine postsurgical follow-up.  Overall he reports that he is doing well.  He has some mild discomfort on the right anterior chest region not requiring any medications.  He notes this primarily was coughing or sneezing.  It is relatively mild but notable.  He has no other specific complaints.      Past Medical History:  Diagnosis Date  . Arthritis   . Asthma     Childhood  . Cataract   . Coronary artery disease   . Diabetes mellitus without complication (HCC)    Type II  . Heart murmur   . Hyperlipidemia   . Hypertension   . S/P aortic valve replacement with bioprosthetic valve 09/24/2019   Size 23 mm Edwards Inspiris Resilia  . 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 Severe aortic valve stenosis   . Shingles 09/2019     Social History   Tobacco Use  Smoking Status Former Smoker  . Packs/day: 1.50  . Years: 25.00  . Pack years: 37.50  . Quit date: 03/30/1997  . Years since quitting: 22.6  Smokeless Tobacco Never Used    Social History   Substance and Sexual Activity  Alcohol Use Never     No Known Allergies  Current Outpatient Medications  Medication Sig Dispense Refill  . acetaminophen (TYLENOL) 500 MG tablet Take 2 tablets (1,000 mg total) by mouth every 6 (six) hours as needed. 30 tablet 0  . aspirin EC 81 MG tablet Take  81 mg by mouth at bedtime.     Marland Kitchen CINNAMON PO Take 200 mcg by mouth daily.    . citalopram (CELEXA) 20 MG tablet Take 20 mg by mouth daily.    . clopidogrel (PLAVIX) 75 MG tablet Take 1 tablet (75 mg total) by mouth daily. 30 tablet 3  . furosemide (LASIX) 40 MG tablet Take 0.5 tablets (20 mg total) by mouth daily. 30 tablet 1  . Insulin Pen Needle 31G X 8 MM MISC Use as directed 100 each 3  . LANTUS SOLOSTAR 100 UNIT/ML Solostar Pen Inject 10-16 Units into the skin daily. Take 20 mg in the morning and 15 mg at bedtime    . losartan (COZAAR) 50 MG tablet Take 0.5 tablets (25 mg total) by mouth daily. 30 tablet 0  . lovastatin (MEVACOR) 40 MG tablet Take 1 tablet (40 mg total) by mouth at bedtime. 90 tablet 3  . metFORMIN (GLUCOPHAGE-XR) 500 MG 24 hr tablet Take 500 mg by mouth at bedtime.     . metoprolol tartrate (LOPRESSOR) 25 MG tablet Take 0.5 tablets (12.5 mg total) by mouth 2 (two) times daily. 30 tablet 1  . Multiple Vitamins-Minerals (MENS MULTIVITAMIN PLUS) TABS Take by mouth daily.      . nitroGLYCERIN (NITROSTAT) 0.4 MG SL tablet Place 1 tablet (0.4 mg total) under the tongue every 5 (five) minutes as needed for chest pain. 25 tablet 3  . potassium chloride (KLOR-CON) 10 MEQ tablet Take 1 tablet (10 mEq total) by mouth 3 (three) times a week. 30 tablet 1  . sitaGLIPtin (JANUVIA) 100 MG tablet Take 1 tablet (100 mg total) by mouth daily. 30 tablet 3   No current facility-administered medications for this visit.       Physical Exam: Ht 5\' 10"  (1.778 m)   BMI 29.43 kg/m   General appearance: alert, cooperative and no distress Heart: regular rate and rhythm and No murmur Lungs: clear to auscultation bilaterally Abdomen: Benign exam Extremities: 1+ lower extremity edema Wound: Incisions well-healed without evidence of infection.   Diagnostic Studies & Laboratory data:     Recent Radiology Findings:   No results found.    Recent Lab Findings: Lab Results  Component Value Date   WBC 8.6 09/29/2019   HGB 9.8 (L) 09/29/2019   HCT 29.5 (L) 09/29/2019   PLT 268 09/29/2019   GLUCOSE 247 (H) 10/28/2019   CHOL 78 09/26/2019   TRIG 67 09/26/2019   HDL 28 (L) 09/26/2019   LDLCALC 37 09/26/2019   ALT 33 09/22/2019   AST 30 09/22/2019   NA 137 10/28/2019   K 4.1 10/28/2019   CL 101 10/28/2019   CREATININE 0.84 10/28/2019   BUN 13 10/28/2019   CO2 26 10/28/2019   INR 1.7 (H) 09/24/2019   HGBA1C 8.2 (H) 09/22/2019      Assessment / Plan: Patient continues to do well.  I have not made any changes to his current medication regimen.  Anticipate this discomfort on the right side of his chest will continue to improve as he heals and inflammation settles.  We will see him again in 2 months or prior to that for any surgically related issues that he may require.      Medication Changes: No orders of the defined types were placed in this encounter.     09/24/2019, PA-C 11/17/2019 3:49 PM

## 2019-11-17 NOTE — Patient Instructions (Signed)
Discussed activity progression and restrictions °

## 2019-12-24 ENCOUNTER — Other Ambulatory Visit: Payer: Self-pay | Admitting: Family

## 2019-12-31 NOTE — Progress Notes (Signed)
Cardiology Office Note  Date:  01/02/2020   ID:  Chase Saunders, DOB 1950/10/14, MRN [id number]  PCP:  Asencion Gowda.[name], MD   Chief Complaint  Patient presents with   other    2 month follow up. Meds reviewed by the pt. verbally. Pt. c/o feeling faint at times.     HPI:  69 year old male  with history of smoking for 20-25 years who stopped 20 years ago,  history of diabetes,  Former smoker 1-1/2 packs/day quit 22 years ago 1999 obesity,  hypertension,  who presents  moderate aortic valve stenosis in 2015, normal ejection fraction Who presents for lightheadedness when bending over, fatigue, aortic valve  Underwent aortic valve replacement with bypass grafting x2 September 24, 2019  Aortic Valve Replacement Edwards Inspiris Resilia Stented Bovine Pericardial Tissue Valve (size 66mm, ref #[id number], serial #[id number]) Left Internal Mammary Artery to Distal Left Anterior Descending Coronary ArterySaphenous Vein Graft to SecondObtuse Marginal Branch of Left Circumflex Coronary Artery Endoscopic Vein Harvest from RightThigh   Works at [employer], long hours Recent lab work reviewed, glucose 247  Had echo in 10/2019 results discussed Normal EF, moderate MR  He does report having high fluid intake Orthostatics checked today, blood pressure running low between 103 up to 106 systolic on today's visit With standing after 3 minutes systolic pressure 95, having some dizziness  Lab work reviewed Prior HBA1C 8.4 in 2015 HAB1c recently:8 Total cholesterol 133 LDL 49  Current weight 209 Prior  weight in 2015 was 225-227  EKG personally reviewed by myself on todays visit shows normal sinus rhythm rate 75 bpm nonspecific T wave abnormality  Family history Brother recently had bypass surgery    PMH:   has a past medical history of Arthritis, Asthma, Cataract, Coronary artery disease, Diabetes mellitus without complication (HCC), Heart murmur, Hyperlipidemia,  Hypertension, S/P aortic valve replacement with bioprosthetic valve (09/24/2019), S/P CABG x 2 (09/24/2019), Severe aortic valve stenosis, and Shingles (09/2019).  PSH:    Past Surgical History:  Procedure Laterality Date   AORTIC VALVE REPLACEMENT N/A 09/24/2019   Procedure: AORTIC VALVE REPLACEMENT (AVR) using Cyndee Brightly Resilia 23 MM Aortic Valve;  Surgeon: Purcell Nails, MD;  Location: [facility] OR;  Service: Open Heart Surgery;  Laterality: N/A;   CARDIAC CATHETERIZATION     COLONOSCOPY     CORONARY ARTERY BYPASS GRAFT N/A 09/24/2019   Procedure: CORONARY ARTERY BYPASS GRAFTING (CABG) using LIMA to LAD; Endoscopic harvested right greater saphenous vein: SVG to OM2;  Surgeon: Purcell Nails, MD;  Location: [facility] OR;  Service: Open Heart Surgery;  Laterality: N/A;   ENDOVEIN HARVEST OF GREATER SAPHENOUS VEIN Right 09/24/2019   Procedure: ENDOVEIN HARVEST OF GREATER SAPHENOUS VEIN;  Surgeon: Purcell Nails, MD;  Location: [facility] OR;  Service: Open Heart Surgery;  Laterality: Right;   RIGHT/LEFT HEART CATH AND CORONARY ANGIOGRAPHY Bilateral 09/19/2019   Procedure: RIGHT/LEFT HEART CATH AND CORONARY ANGIOGRAPHY;  Surgeon: Antonieta Iba, MD;  Location: [facility] INVASIVE CV LAB;  Service: Cardiovascular;  Laterality: Bilateral;   TEE WITHOUT CARDIOVERSION N/A 09/24/2019   Procedure: TRANSESOPHAGEAL ECHOCARDIOGRAM (TEE);  Surgeon: Purcell Nails, MD;  Location: [facility] OR;  Service: Open Heart Surgery;  Laterality: N/A;   TONSILLECTOMY AND ADENOIDECTOMY  1969   WISDOM TOOTH EXTRACTION      Current Outpatient Medications  Medication Sig Dispense Refill   acetaminophen (TYLENOL) 500 MG tablet Take 2 tablets (1,000 mg total) by mouth every 6 (six) hours as needed. 30 tablet 0  aspirin EC 81 MG tablet Take 81 mg by mouth at bedtime.      CINNAMON PO Take 200 mcg by mouth daily.     citalopram (CELEXA) 20 MG tablet Take 20 mg by mouth daily.     clopidogrel (PLAVIX) 75 MG tablet Take 1 tablet  (75 mg total) by mouth daily. 30 tablet 3   furosemide (LASIX) 40 MG tablet Take 0.5 tablets (20 mg total) by mouth daily. 15 tablet 0   Insulin Pen Needle 31G X 8 MM MISC Use as directed 100 each 3   LANTUS SOLOSTAR 100 UNIT/ML Solostar Pen Inject 10-16 Units into the skin daily. Take 20 mg in the morning and 15 mg at bedtime     losartan (COZAAR) 50 MG tablet Take 0.5 tablets (25 mg total) by mouth daily. 30 tablet 0   lovastatin (MEVACOR) 40 MG tablet Take 1 tablet (40 mg total) by mouth at bedtime. 90 tablet 3   metFORMIN (GLUCOPHAGE-XR) 500 MG 24 hr tablet Take 500 mg by mouth at bedtime.      metoprolol tartrate (LOPRESSOR) 25 MG tablet Take 0.5 tablets (12.5 mg total) by mouth 2 (two) times daily. 30 tablet 1   Multiple Vitamins-Minerals (MENS MULTIVITAMIN PLUS) TABS Take by mouth daily.     nitroGLYCERIN (NITROSTAT) 0.4 MG SL tablet Place 1 tablet (0.4 mg total) under the tongue every 5 (five) minutes as needed for chest pain. 25 tablet 3   potassium chloride (KLOR-CON) 10 MEQ tablet Take 1 tablet (10 mEq total) by mouth 3 (three) times a week. 30 tablet 1   sitaGLIPtin (JANUVIA) 100 MG tablet Take 1 tablet (100 mg total) by mouth daily. 30 tablet 3   No current facility-administered medications for this visit.     Allergies:   Patient has no known allergies.   Social History:  The patient  reports that he quit smoking about 22 years ago. He has a 37.50 pack-year smoking history. He has never used smokeless tobacco. He reports that he does not drink alcohol and does not use drugs.   Family History:   family history includes Alcohol abuse in his mother; Cancer (age of onset: 80) in his father; Diabetes in his paternal grandmother; Heart disease in his father; Hypertension in his mother.   Review of Systems: Review of Systems  Constitutional: Negative.   HENT: Negative.   Respiratory: Negative.   Cardiovascular: Negative.   Gastrointestinal: Negative.   Musculoskeletal:  Negative.   Neurological: Negative.   Psychiatric/Behavioral: Negative.   All other systems reviewed and are negative.  PHYSICAL EXAM: VS:  BP 104/60 (BP Location: Left Arm, Patient Position: Sitting, Cuff Size: Normal)    Pulse 75    Ht 5\' 10"  (1.778 m)    Wt 207 lb 8 oz (94.1 kg)    SpO2 97%    BMI 29.77 kg/m  , BMI Body mass index is 29.77 kg/m. Constitutional:  oriented to person, place, and time. No distress.  HENT:  Head: Grossly normal Eyes:  no discharge. No scleral icterus.  Neck: No JVD, no carotid bruits  Cardiovascular: Regular rate and rhythm, no murmurs appreciated Pulmonary/Chest: Clear to auscultation bilaterally, no wheezes or rails Abdominal: Soft.  no distension.  no tenderness.  Musculoskeletal: Normal range of motion Neurological:  normal muscle tone. Coordination normal. No atrophy Skin: Skin warm and dry Psychiatric: normal affect, pleasant  Recent Labs: 09/22/2019: ALT 33 09/25/2019: Magnesium 2.5 09/29/2019: Hemoglobin 9.8; Platelets 268 10/28/2019: BUN 13; Creatinine, Ser  0.84; Potassium 4.1; Sodium 137    Lipid Panel Lab Results  Component Value Date   CHOL 78 09/26/2019   HDL 28 (L) 09/26/2019   LDLCALC 37 09/26/2019   TRIG 67 09/26/2019      Wt Readings from Last 3 Encounters:  01/02/20 207 lb 8 oz (94.1 kg)  11/17/19 211 lb (95.7 kg)  10/31/19 205 lb 2 oz (93 kg)     ASSESSMENT AND PLAN:  Problem List Items Addressed This Visit      Cardiology Problems   Essential hypertension   Severe aortic valve stenosis    Other Visit Diagnoses    Coronary artery disease of native artery of native heart with stable angina pectoris (HCC)    -  Primary   Hyperlipidemia LDL goal <70       S/P AVR       S/P CABG (coronary artery bypass graft)       Lightheadedness         Aortic valve stenosis Recent successful surgery AVR 09/2019 August 2021 echo : stable AV bioprosthetic valve  CAD with chronic stable angina Recent bypass x2 Denies anginal  symptoms Long discussion concerning benefits of cardiac rehab, he is willing to look into it Referral placed  Essential hypertension Blood pressure running low with orthostasis symptoms, recommend he cut his losartan to 12.5 mg daily and take this in the evening If he continues to have orthostasis symptoms recommended he call our office for further medication changes Reports he is staying hydrated  Hyperlipidemia Cholesterol is at goal on the current lipid regimen. No changes to the medications were made.     Total encounter time more than 25 minutes  Greater than 50% was spent in counseling and coordination of care with the patient    Signed, Dossie Arbour, M.D., Ph.D. [facility] [place], [place] [phone]

## 2020-01-02 ENCOUNTER — Ambulatory Visit: Payer: Medicare HMO | Admitting: Cardiovascular Disease

## 2020-01-02 ENCOUNTER — Encounter: Payer: Self-pay | Admitting: Cardiovascular Disease

## 2020-01-02 ENCOUNTER — Other Ambulatory Visit: Payer: Self-pay

## 2020-01-02 VITALS — BP 104/60 | HR 75 | Ht 70.0 in | Wt 207.5 lb

## 2020-01-02 DIAGNOSIS — I350 Nonrheumatic aortic (valve) stenosis: Secondary | ICD-10-CM

## 2020-01-02 DIAGNOSIS — Z951 Presence of aortocoronary bypass graft: Secondary | ICD-10-CM

## 2020-01-02 DIAGNOSIS — R42 Dizziness and giddiness: Secondary | ICD-10-CM | POA: Diagnosis not present

## 2020-01-02 DIAGNOSIS — Z952 Presence of prosthetic heart valve: Secondary | ICD-10-CM | POA: Diagnosis not present

## 2020-01-02 DIAGNOSIS — I25118 Atherosclerotic heart disease of native coronary artery with other forms of angina pectoris: Secondary | ICD-10-CM

## 2020-01-02 DIAGNOSIS — E785 Hyperlipidemia, unspecified: Secondary | ICD-10-CM

## 2020-01-02 DIAGNOSIS — I10 Essential (primary) hypertension: Secondary | ICD-10-CM

## 2020-01-02 MED ORDER — LOSARTAN POTASSIUM 25 MG PO TABS: 12.5000 mg | ORAL_TABLET | Freq: Every day | ORAL | 3 refills | Status: DC

## 2020-01-02 NOTE — Patient Instructions (Addendum)
Cardiac rehab at [facility]  Medication Instructions:  Please decrease the losartan down to 12.5 mg daily in the evening Stay on metoprolol 12.5 twice a day Am and PM Stay on lasix/furosemide   If you need a refill on your cardiac medications before your next appointment, please call your pharmacy.    Lab work: No new labs needed   If you have labs (blood work) drawn today and your tests are completely normal, you will receive your results only by: Marland Kitchen MyChart Message (if you have MyChart) OR . A paper copy in the mail If you have any lab test that is abnormal or we need to change your treatment, we will call you to review the results.   Testing/Procedures: No new testing needed   Follow-Up: At [facility], you and your health needs are our priority.  As part of our continuing mission to provide you with exceptional heart care, we have created designated Provider Care Teams.  These Care Teams include your primary Cardiologist (physician) and Advanced Practice Providers (APPs -  Physician Assistants and Nurse Practitioners) who all work together to provide you with the care you need, when you need it.  . You will need a follow up appointment in 6 months  . Providers on your designated Care Team:   . Nicolasa Ducking, NP . Eula Listen, PA-C . Marisue Ivan, PA-C  Any Other Special Instructions Will Be Listed Below (If Applicable).  COVID-19 Vaccine Information can be found at: [email or web address] For questions related to vaccine distribution or appointments, please email [email or web address] or call [phone].

## 2020-01-08 ENCOUNTER — Other Ambulatory Visit: Payer: Self-pay

## 2020-01-08 ENCOUNTER — Encounter: Payer: Medicare HMO | Attending: Cardiovascular Disease

## 2020-01-08 DIAGNOSIS — I350 Nonrheumatic aortic (valve) stenosis: Secondary | ICD-10-CM | POA: Insufficient documentation

## 2020-01-08 DIAGNOSIS — Z951 Presence of aortocoronary bypass graft: Secondary | ICD-10-CM | POA: Insufficient documentation

## 2020-01-08 DIAGNOSIS — E785 Hyperlipidemia, unspecified: Secondary | ICD-10-CM | POA: Insufficient documentation

## 2020-01-08 DIAGNOSIS — I25118 Atherosclerotic heart disease of native coronary artery with other forms of angina pectoris: Secondary | ICD-10-CM | POA: Insufficient documentation

## 2020-01-08 DIAGNOSIS — I10 Essential (primary) hypertension: Secondary | ICD-10-CM | POA: Insufficient documentation

## 2020-01-08 DIAGNOSIS — R42 Dizziness and giddiness: Secondary | ICD-10-CM | POA: Insufficient documentation

## 2020-01-08 NOTE — Progress Notes (Signed)
Virtual Visit completed. Patient informed on EP and RD appointment and 6 Minute walk test. Patient also informed of patient health questionnaires on My Chart. Patient Verbalizes understanding. Visit diagnosis can be found in [facility] 09/24/2019. 

## 2020-01-12 ENCOUNTER — Encounter: Payer: Self-pay | Admitting: *Deleted

## 2020-01-12 ENCOUNTER — Other Ambulatory Visit: Payer: Self-pay

## 2020-01-12 ENCOUNTER — Encounter: Payer: Medicare HMO | Admitting: *Deleted

## 2020-01-12 VITALS — Ht 68.9 in | Wt 206.3 lb

## 2020-01-12 DIAGNOSIS — Z952 Presence of prosthetic heart valve: Secondary | ICD-10-CM

## 2020-01-12 DIAGNOSIS — E785 Hyperlipidemia, unspecified: Secondary | ICD-10-CM | POA: Diagnosis not present

## 2020-01-12 DIAGNOSIS — Z951 Presence of aortocoronary bypass graft: Secondary | ICD-10-CM | POA: Diagnosis not present

## 2020-01-12 DIAGNOSIS — I25118 Atherosclerotic heart disease of native coronary artery with other forms of angina pectoris: Secondary | ICD-10-CM | POA: Diagnosis not present

## 2020-01-12 DIAGNOSIS — I10 Essential (primary) hypertension: Secondary | ICD-10-CM | POA: Diagnosis not present

## 2020-01-12 DIAGNOSIS — R42 Dizziness and giddiness: Secondary | ICD-10-CM | POA: Diagnosis not present

## 2020-01-12 DIAGNOSIS — I350 Nonrheumatic aortic (valve) stenosis: Secondary | ICD-10-CM | POA: Diagnosis not present

## 2020-01-12 NOTE — Progress Notes (Signed)
Cardiac Individual Treatment Plan  Patient Details  Name: Chase Saunders MRN: [id number] Date of Birth: 12-19-1950 Referring Provider:     Cardiac Rehab from 01/12/2020 in [facility] Cardiac and Pulmonary Rehab  Referring Provider Julien Nordmann MD      Initial Encounter Date:    Cardiac Rehab from 01/12/2020 in [facility] Cardiac and Pulmonary Rehab  Date 01/12/20      Visit Diagnosis: S/P CABG x 2  S/P aortic valve replacement  Patient's Home Medications on Admission:  Current Outpatient Medications:  .  acetaminophen (TYLENOL) 500 MG tablet, Take 2 tablets (1,000 mg total) by mouth every 6 (six) hours as needed., Disp: 30 tablet, Rfl: 0 .  aspirin EC 81 MG tablet, Take 81 mg by mouth at bedtime. , Disp: , Rfl:  .  CINNAMON PO, Take 200 mcg by mouth daily., Disp: , Rfl:  .  citalopram (CELEXA) 20 MG tablet, Take 20 mg by mouth daily., Disp: , Rfl:  .  clopidogrel (PLAVIX) 75 MG tablet, Take 1 tablet (75 mg total) by mouth daily., Disp: 30 tablet, Rfl: 3 .  furosemide (LASIX) 40 MG tablet, Take 0.5 tablets (20 mg total) by mouth daily., Disp: 15 tablet, Rfl: 0 .  Insulin Pen Needle 31G X 8 MM MISC, Use as directed, Disp: 100 each, Rfl: 3 .  LANTUS SOLOSTAR 100 UNIT/ML Solostar Pen, Inject 10-16 Units into the skin daily. Take 20 mg in the morning and 15 mg at bedtime, Disp: , Rfl:  .  losartan (COZAAR) 25 MG tablet, Take 0.5 tablets (12.5 mg total) by mouth daily., Disp: 45 tablet, Rfl: 3 .  lovastatin (MEVACOR) 40 MG tablet, Take 1 tablet (40 mg total) by mouth at bedtime., Disp: 90 tablet, Rfl: 3 .  metFORMIN (GLUCOPHAGE-XR) 500 MG 24 hr tablet, Take 500 mg by mouth at bedtime. , Disp: , Rfl:  .  metoprolol tartrate (LOPRESSOR) 25 MG tablet, Take 0.5 tablets (12.5 mg total) by mouth 2 (two) times daily., Disp: 30 tablet, Rfl: 1 .  Multiple Vitamins-Minerals (MENS MULTIVITAMIN PLUS) TABS, Take by mouth daily., Disp: , Rfl:  .  nitroGLYCERIN (NITROSTAT) 0.4 MG SL tablet, Place 1 tablet  (0.4 mg total) under the tongue every 5 (five) minutes as needed for chest pain., Disp: 25 tablet, Rfl: 3 .  potassium chloride (KLOR-CON) 10 MEQ tablet, Take 1 tablet (10 mEq total) by mouth 3 (three) times a week., Disp: 30 tablet, Rfl: 1 .  sitaGLIPtin (JANUVIA) 100 MG tablet, Take 1 tablet (100 mg total) by mouth daily., Disp: 30 tablet, Rfl: 3  Past Medical History: Past Medical History:  Diagnosis Date  . Arthritis   . Asthma    Childhood  . Cataract   . Coronary artery disease   . Diabetes mellitus without complication (HCC)    Type II  . Heart murmur   . Hyperlipidemia   . Hypertension   . S/P aortic valve replacement with bioprosthetic valve 09/24/2019   Size 23 mm Edwards Inspiris Resilia  . 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 Severe aortic valve stenosis   . Shingles 09/2019    Tobacco Use: Social History   Tobacco Use  Smoking Status Former Smoker  . Packs/day: 1.50  . Years: 25.00  . Pack years: 37.50  . Types: Cigarettes  . Quit date: 03/30/1997  . Years since quitting: 22.8  Smokeless Tobacco Never Used    Labs: Recent Review Advice worker  Labs for ITP Cardiac and Pulmonary Rehab Latest Ref Rng & Units 09/24/2019 09/24/2019 09/24/2019 09/24/2019 09/26/2019   Cholestrol 0 - 200 mg/dL - - - - 78   LDLCALC 0 - 99 mg/dL - - - - 37   HDL >27 mg/dL - - - - 78(E)   Trlycerides <150 mg/dL - - - - 67   Hemoglobin A1c 4.8 - 5.6 % - - - - -   PHART 7.35 - 7.45 - 7.330(L) 7.299(L) 7.280(L) -   PCO2ART 32 - 48 mmHg - 49.7(H) 51.6(H) 41.1 -   HCO3 20.0 - 28.0 mmol/L - 26.3 25.1 19.1(L) -   TCO2 22 - 32 mmol/L 27 28 27  20(L) -   ACIDBASEDEF 0.0 - 2.0 mmol/L - - 1.0 7.0(H) -   O2SAT % - 96.0 94.0 93.0 -       Exercise Target Goals: Exercise Program Goal: Individual exercise prescription set using results from initial 6 min walk test and THRR while considering  patient's activity barriers and safety.   Exercise Prescription Goal: Initial  exercise prescription builds to 30-45 minutes a day of aerobic activity, 2-3 days per week.  Home exercise guidelines will be given to patient during program as part of exercise prescription that the participant will acknowledge.   Education: Aerobic Exercise: - Group verbal and visual presentation on the components of exercise prescription. Introduces F.I.T.T principle from ACSM for exercise prescriptions.  Reviews F.I.T.T. principles of aerobic exercise including progression. Written material given at graduation.   Education: Resistance Exercise: - Group verbal and visual presentation on the components of exercise prescription. Introduces F.I.T.T principle from ACSM for exercise prescriptions  Reviews F.I.T.T. principles of resistance exercise including progression. Written material given at graduation.    Education: Exercise & Equipment Safety: - Individual verbal instruction and demonstration of equipment use and safety with use of the equipment.   Cardiac Rehab from 01/08/2020 in [facility] Cardiac and Pulmonary Rehab  Date 01/08/20  Educator [facility]  Instruction Review Code 1- Verbalizes Understanding      Education: Exercise Physiology & General Exercise Guidelines: - Group verbal and written instruction with models to review the exercise physiology of the cardiovascular system and associated critical values. Provides general exercise guidelines with specific guidelines to those with heart or lung disease.    Cardiac Rehab from 01/08/2020 in [facility] Cardiac and Pulmonary Rehab  Education need identified 01/12/20      Education: Flexibility, Balance, Mind/Body Relaxation: - Group verbal and visual presentation with interactive activity on the components of exercise prescription. Introduces F.I.T.T principle from ACSM for exercise prescriptions. Reviews F.I.T.T. principles of flexibility and balance exercise training including progression. Also discusses the mind body connection.  Reviews various  relaxation techniques to help reduce and manage stress (i.e. Deep breathing, progressive muscle relaxation, and visualization). Balance handout provided to take home. Written material given at graduation.   Activity Barriers & Risk Stratification:  Activity Barriers & Cardiac Risk Stratification - 01/12/20 1119      Activity Barriers & Cardiac Risk Stratification   Activity Barriers Muscular Weakness;Deconditioning;Shortness of Breath;History of Falls;Other (comment)    Comments increased ectopy with exercise (note sent to cardiologist)    Cardiac Risk Stratification High           6 Minute Walk:  6 Minute Walk    Row Name 01/12/20 1117         6 Minute Walk   Phase Initial     Distance 1079 feet  Walk Time 6 minutes     # of Rest Breaks 0     MPH 2.04     METS 2.75     RPE 12     Perceived Dyspnea  1     VO2 Peak 9.63     Symptoms Yes (comment)     Comments hip pain 5/10, SOB from mask     Resting HR 75 bpm     Resting BP 126/70     Resting Oxygen Saturation  95 %     Exercise Oxygen Saturation  during 6 min walk 96 %     Max Ex. HR 112 bpm     Max Ex. BP 158/64     2 Minute Post BP 112/66            Oxygen Initial Assessment:   Oxygen Re-Evaluation:   Oxygen Discharge (Final Oxygen Re-Evaluation):   Initial Exercise Prescription:  Initial Exercise Prescription - 01/12/20 1100      Date of Initial Exercise RX and Referring Provider   Date 01/12/20    Referring Provider Julien Nordmann MD      Treadmill   MPH 2    Grade 0.5    Minutes 15    METs 2.67      Recumbant Bike   Level 2    RPM 50    Watts 22    Minutes 15    METs 2      NuStep   Level 2    SPM 80    Minutes 15    METs 2      Arm Ergometer   Level 2    Watts 40    RPM 25    Minutes 15    METs 2      Biostep-RELP   Level 2    SPM 50    Minutes 15    METs 2      Prescription Details   Frequency (times per week) 3    Duration Progress to 30 minutes of continuous  aerobic without signs/symptoms of physical distress      Intensity   THRR 40-80% of Max Heartrate 105-136    Ratings of Perceived Exertion 11-13    Perceived Dyspnea 0-4      Progression   Progression Continue to progress workloads to maintain intensity without signs/symptoms of physical distress.      Resistance Training   Training Prescription Yes    Weight 4 lb    Reps 10-15           Perform Capillary Blood Glucose checks as needed.  Exercise Prescription Changes:  Exercise Prescription Changes    Row Name 01/12/20 1100             Response to Exercise   Blood Pressure (Admit) 126/70       Blood Pressure (Exercise) 158/64       Blood Pressure (Exit) 112/66       Heart Rate (Admit) 75 bpm       Heart Rate (Exercise) 112 bpm       Heart Rate (Exit) 81 bpm       Oxygen Saturation (Admit) 95 %       Oxygen Saturation (Exercise) 96 %       Rating of Perceived Exertion (Exercise) 12       Perceived Dyspnea (Exercise) 1       Symptoms hip pain 5/10       Comments walk test  results              Exercise Comments:   Exercise Goals and Review:  Exercise Goals    Row Name 01/12/20 1139             Exercise Goals   Increase Physical Activity Yes       Intervention Provide advice, education, support and counseling about physical activity/exercise needs.;Develop an individualized exercise prescription for aerobic and resistive training based on initial evaluation findings, risk stratification, comorbidities and participant's personal goals.       Expected Outcomes Short Term: Attend rehab on a regular basis to increase amount of physical activity.;Long Term: Add in home exercise to make exercise part of routine and to increase amount of physical activity.;Long Term: Exercising regularly at least 3-5 days a week.       Increase Strength and Stamina Yes       Intervention Provide advice, education, support and counseling about physical activity/exercise  needs.;Develop an individualized exercise prescription for aerobic and resistive training based on initial evaluation findings, risk stratification, comorbidities and participant's personal goals.       Expected Outcomes Short Term: Increase workloads from initial exercise prescription for resistance, speed, and METs.;Short Term: Perform resistance training exercises routinely during rehab and add in resistance training at home;Long Term: Improve cardiorespiratory fitness, muscular endurance and strength as measured by increased METs and functional capacity ( )       Able to understand and use rate of perceived exertion (RPE) scale Yes       Intervention Provide education and explanation on how to use RPE scale       Expected Outcomes Short Term: Able to use RPE daily in rehab to express subjective intensity level;Long Term:  Able to use RPE to guide intensity level when exercising independently       Able to understand and use Dyspnea scale Yes       Intervention Provide education and explanation on how to use Dyspnea scale       Expected Outcomes Short Term: Able to use Dyspnea scale daily in rehab to express subjective sense of shortness of breath during exertion;Long Term: Able to use Dyspnea scale to guide intensity level when exercising independently       Knowledge and understanding of Target Heart Rate Range (THRR) Yes       Intervention Provide education and explanation of THRR including how the numbers were predicted and where they are located for reference       Expected Outcomes Short Term: Able to state/look up THRR;Short Term: Able to use daily as guideline for intensity in rehab;Long Term: Able to use THRR to govern intensity when exercising independently       Able to check pulse independently Yes       Intervention Provide education and demonstration on how to check pulse in carotid and radial arteries.;Review the importance of being able to check your own pulse for safety during  independent exercise       Expected Outcomes Short Term: Able to explain why pulse checking is important during independent exercise;Long Term: Able to check pulse independently and accurately       Understanding of Exercise Prescription Yes       Intervention Provide education, explanation, and written materials on patient's individual exercise prescription       Expected Outcomes Short Term: Able to explain program exercise prescription;Long Term: Able to explain home exercise prescription to exercise independently  Exercise Goals Re-Evaluation :   Discharge Exercise Prescription (Final Exercise Prescription Changes):  Exercise Prescription Changes - 01/12/20 1100      Response to Exercise   Blood Pressure (Admit) 126/70    Blood Pressure (Exercise) 158/64    Blood Pressure (Exit) 112/66    Heart Rate (Admit) 75 bpm    Heart Rate (Exercise) 112 bpm    Heart Rate (Exit) 81 bpm    Oxygen Saturation (Admit) 95 %    Oxygen Saturation (Exercise) 96 %    Rating of Perceived Exertion (Exercise) 12    Perceived Dyspnea (Exercise) 1    Symptoms hip pain 5/10    Comments walk test results           Nutrition:  Target Goals: Understanding of nutrition guidelines, daily intake of sodium 1500mg , cholesterol 200mg , calories 30% from fat and 7% or less from saturated fats, daily to have 5 or more servings of fruits and vegetables.  Education: All About Nutrition: -Group instruction provided by verbal, written material, interactive activities, discussions, models, and posters to present general guidelines for heart healthy nutrition including fat, fiber, MyPlate, the role of sodium in heart healthy nutrition, utilization of the nutrition label, and utilization of this knowledge for meal planning. Follow up email sent as well. Written material given at graduation.   Cardiac Rehab from 01[facility]08/2020 in [facility] Cardiac and Pulmonary Rehab  Education need identified 01/12/20       Biometrics:  Pre Biometrics - 01/12/20 1139      Pre Biometrics   Height 5' 8.9" (1.75 m)    Weight 206 lb 4.8 oz (93.6 kg)    BMI (Calculated) 30.56    Single Leg Stand 29.4 seconds            Nutrition Therapy Plan and Nutrition Goals:   Nutrition Assessments:  Nutrition Assessments - 01/12/20 0816      MEDFICTS Scores   Pre Score 23           MEDIFICTS Score Key:          ?70 Need to make dietary changes          40-70 Heart Healthy Diet         ? 40 Therapeutic Level Cholesterol Diet  Nutrition Goals Re-Evaluation:   Nutrition Goals Discharge (Final Nutrition Goals Re-Evaluation):   Psychosocial: Target Goals: Acknowledge presence or absence of significant depression and/or stress, maximize coping skills, provide positive support system. Participant is able to verbalize types and ability to use techniques and skills needed for reducing stress and depression.   Education: Stress, Anxiety, and Depression - Group verbal and visual presentation to define topics covered.  Reviews how body is impacted by stress, anxiety, and depression.  Also discusses healthy ways to reduce stress and to treat/manage anxiety and depression.  Written material given at graduation.   Education: Sleep Hygiene -Provides group verbal and written instruction about how sleep can affect your health.  Define sleep hygiene, discuss sleep cycles and impact of sleep habits. Review good sleep hygiene tips.    Initial Review & Psychosocial Screening:  Initial Psych Review & Screening - 01/08/20 1009      Initial Review   Current issues with None Identified      Family Dynamics   Good Support System? Yes    Comments He can look to his wife, daughter and grandson for support. Lu DuffelStuart has a positive outlook on his health.      Barriers  Psychosocial barriers to participate in program The patient should benefit from training in stress management and relaxation.;There are no identifiable  barriers or psychosocial needs.      Screening Interventions   Interventions Encouraged to exercise;Provide feedback about the scores to participant;To provide support and resources with identified psychosocial needs    Expected Outcomes Short Term goal: Utilizing psychosocial counselor, staff and physician to assist with identification of specific Stressors or current issues interfering with healing process. Setting desired goal for each stressor or current issue identified.;Long Term Goal: Stressors or current issues are controlled or eliminated.;Short Term goal: Identification and review with participant of any Quality of Life or Depression concerns found by scoring the questionnaire.;Long Term goal: The participant improves quality of Life and PHQ9 Scores as seen by post scores and/or verbalization of changes           Quality of Life Scores:   Quality of Life - 01/12/20 0815      Quality of Life   Select Quality of Life      Quality of Life Scores   Health/Function Pre 28.23 %    Socioeconomic Pre 26.62 %    Psych/Spiritual Pre 29.28 %    Family Pre 30 %    GLOBAL Pre 28.32 %          Scores of 19 and below usually indicate a poorer quality of life in these areas.  A difference of  2-3 points is a clinically meaningful difference.  A difference of 2-3 points in the total score of the Quality of Life Index has been associated with significant improvement in overall quality of life, self-image, physical symptoms, and general health in studies assessing change in quality of life.  PHQ-9: Recent Review Flowsheet Data    Depression screen [facility] 2/9 01/12/2020   Decreased Interest 0   Down, Depressed, Hopeless 0   PHQ - 2 Score 0   Altered sleeping 0   Tired, decreased energy 1   Change in appetite 0   Feeling bad or failure about yourself  0   Trouble concentrating 0   Moving slowly or fidgety/restless 0   Suicidal thoughts 0   PHQ-9 Score 1   Difficult doing work/chores Not  difficult at all     Interpretation of Total Score  Total Score Depression Severity:  1-4 = Minimal depression, 5-9 = Mild depression, 10-14 = Moderate depression, 15-19 = Moderately severe depression, 20-27 = Severe depression   Psychosocial Evaluation and Intervention:  Psychosocial Evaluation - 01/08/20 1011      Psychosocial Evaluation & Interventions   Interventions Encouraged to exercise with the program and follow exercise prescription;Relaxation education    Comments He can look to his wife, daughter and grandson for support. Lakyn has a positive outlook on his health.    Expected Outcomes Short: Exercise regularly to support mental health and notify staff of any changes. Long: maintain mental health and well being through teaching of rehab or prescribed medications independently.    Continue Psychosocial Services  Follow up required by staff           Psychosocial Re-Evaluation:   Psychosocial Discharge (Final Psychosocial Re-Evaluation):   Vocational Rehabilitation: Provide vocational rehab assistance to qualifying candidates.   Vocational Rehab Evaluation & Intervention:   Education: Education Goals: Education classes will be provided on a variety of topics geared toward better understanding of heart health and risk factor modification. Participant will state understanding/return demonstration of topics presented as noted by education  test scores.  Learning Barriers/Preferences:  Learning Barriers/Preferences - 01/08/20 1009      Learning Barriers/Preferences   Learning Barriers None    Learning Preferences None           General Cardiac Education Topics:  AED/CPR: - Group verbal and written instruction with the use of models to demonstrate the basic use of the AED with the basic ABC's of resuscitation.   Anatomy and Cardiac Procedures: - Group verbal and visual presentation and models provide information about basic cardiac anatomy and function.  Reviews the testing methods done to diagnose heart disease and the outcomes of the test results. Describes the treatment choices: Medical Management, Angioplasty, or Coronary Bypass Surgery for treating various heart conditions including Myocardial Infarction, Angina, Valve Disease, and Cardiac Arrhythmias.  Written material given at graduation.   Cardiac Rehab from 01/08/2020 in [facility] Cardiac and Pulmonary Rehab  Education need identified 01/12/20      Medication Safety: - Group verbal and visual instruction to review commonly prescribed medications for heart and lung disease. Reviews the medication, class of the drug, and side effects. Includes the steps to properly store meds and maintain the prescription regimen.  Written material given at graduation.   Intimacy: - Group verbal instruction through game format to discuss how heart and lung disease can affect sexual intimacy. Written material given at graduation..   Know Your Numbers and Heart Failure: - Group verbal and visual instruction to discuss disease risk factors for cardiac and pulmonary disease and treatment options.  Reviews associated critical values for Overweight/Obesity, Hypertension, Cholesterol, and Diabetes.  Discusses basics of heart failure: signs/symptoms and treatments.  Introduces Heart Failure Zone chart for action plan for heart failure.  Written material given at graduation.   Infection Prevention: - Provides verbal and written material to individual with discussion of infection control including proper hand washing and proper equipment cleaning during exercise session.   Cardiac Rehab from 01/08/2020 in [facility] Cardiac and Pulmonary Rehab  Date 01/08/20  Educator [facility]  Instruction Review Code 1- Verbalizes Understanding      Falls Prevention: - Provides verbal and written material to individual with discussion of falls prevention and safety.   Cardiac Rehab from 01/08/2020 in [facility] Cardiac and Pulmonary Rehab  Date  01/08/20  Educator [facility]  Instruction Review Code 1- Verbalizes Understanding      Other: -Provides group and verbal instruction on various topics (see comments)   Knowledge Questionnaire Score:  Knowledge Questionnaire Score - 01/12/20 0816      Knowledge Questionnaire Score   Pre Score 23/26 Education Focus: Exercise, Nutrition, Angina           Core Components/Risk Factors/Patient Goals at Admission:  Personal Goals and Risk Factors at Admission - 01/12/20 1140      Core Components/Risk Factors/Patient Goals on Admission    Weight Management Yes;Weight Loss;Obesity    Intervention Weight Management: Provide education and appropriate resources to help participant work on and attain dietary goals.;Weight Management: Develop a combined nutrition and exercise program designed to reach desired caloric intake, while maintaining appropriate intake of nutrient and fiber, sodium and fats, and appropriate energy expenditure required for the weight goal.;Weight Management/Obesity: Establish reasonable short term and long term weight goals.;Obesity: Provide education and appropriate resources to help participant work on and attain dietary goals.    Admit Weight 206 lb 4.8 oz (93.6 kg)    Goal Weight: Short Term 200 lb (90.7 kg)    Goal Weight: Long Term 180 lb (  81.6 kg)    Expected Outcomes Short Term: Continue to assess and modify interventions until short term weight is achieved;Long Term: Adherence to nutrition and physical activity/exercise program aimed toward attainment of established weight goal;Weight Loss: Understanding of general recommendations for a balanced deficit meal plan, which promotes 1-2 lb weight loss per week and includes a negative energy balance of [phone] kcal/d;Understanding of distribution of calorie intake throughout the day with the consumption of 4-5 meals/snacks;Understanding recommendations for meals to include 15-35% energy as protein, 25-35% energy from fat, 35-60%  energy from carbohydrates, less than 200mg  of dietary cholesterol, 20-35 gm of total fiber daily    Diabetes Yes    Intervention Provide education about signs/symptoms and action to take for hypo/hyperglycemia.;Provide education about proper nutrition, including hydration, and aerobic/resistive exercise prescription along with prescribed medications to achieve blood glucose in normal ranges: Fasting glucose 65-99 mg/dL    Expected Outcomes Short Term: Participant verbalizes understanding of the signs/symptoms and immediate care of hyper/hypoglycemia, proper foot care and importance of medication, aerobic/resistive exercise and nutrition plan for blood glucose control.;Long Term: Attainment of HbA1C < 7%.    Hypertension Yes    Intervention Provide education on lifestyle modifcations including regular physical activity/exercise, weight management, moderate sodium restriction and increased consumption of fresh fruit, vegetables, and low fat dairy, alcohol moderation, and smoking cessation.;Monitor prescription use compliance.    Expected Outcomes Short Term: Continued assessment and intervention until BP is < 140/26mm HG in hypertensive participants. < 130/72mm HG in hypertensive participants with diabetes, heart failure or chronic kidney disease.;Long Term: Maintenance of blood pressure at goal levels.    Lipids Yes    Intervention Provide education and support for participant on nutrition & aerobic/resistive exercise along with prescribed medications to achieve LDL 70mg , HDL >40mg .    Expected Outcomes Short Term: Participant states understanding of desired cholesterol values and is compliant with medications prescribed. Participant is following exercise prescription and nutrition guidelines.;Long Term: Cholesterol controlled with medications as prescribed, with individualized exercise RX and with personalized nutrition plan. Value goals: LDL < 70mg , HDL > 40 mg.           Education:Diabetes -  Individual verbal and written instruction to review signs/symptoms of diabetes, desired ranges of glucose level fasting, after meals and with exercise. Acknowledge that pre and post exercise glucose checks will be done for 3 sessions at entry of program.   Cardiac Rehab from 01/08/2020 in [facility] Cardiac [facility]  Date 01/08/20  Educator [facility]  Instruction Review Code 1- Verbalizes Understanding      Core Components/Risk Factors/Patient Goals Review:    Core Components/Risk Factors/Patient Goals at Discharge (Final Review):    ITP Comments:  ITP Comments    Row Name 01/08/20 1013 01/12/20 1117         ITP Comments Virtual Visit completed. Patient informed on EP and RD appointment and 6 Minute walk test. Patient also informed of patient health questionnaires on My Chart. Patient Verbalizes understanding. Visit diagnosis can be found in [facility] 09/24/2019. Completed [facility] and gym orientation. Initial ITP created and sent for review to Dr. 09/26/2019, Medical Director.             Comments: Initial ITP

## 2020-01-12 NOTE — Patient Instructions (Signed)
Patient Instructions  Patient Details  Name: Chase Saunders MRN: [id number] Date of Birth: 1950-07-14 Referring Provider:  Antonieta Iba, MD  Below are your personal goals for exercise, nutrition, and risk factors. Our goal is to help you stay on track towards obtaining and maintaining these goals. We will be discussing your progress on these goals with you throughout the program.  Initial Exercise Prescription:  Initial Exercise Prescription - 01/12/20 1100      Date of Initial Exercise RX and Referring Provider   Date 01/12/20    Referring Provider Julien Nordmann MD      Treadmill   MPH 2    Grade 0.5    Minutes 15    METs 2.67      Recumbant Bike   Level 2    RPM 50    Watts 22    Minutes 15    METs 2      NuStep   Level 2    SPM 80    Minutes 15    METs 2      Arm Ergometer   Level 2    Watts 40    RPM 25    Minutes 15    METs 2      Biostep-RELP   Level 2    SPM 50    Minutes 15    METs 2      Prescription Details   Frequency (times per week) 3    Duration Progress to 30 minutes of continuous aerobic without signs/symptoms of physical distress      Intensity   THRR 40-80% of Max Heartrate 105-136    Ratings of Perceived Exertion 11-13    Perceived Dyspnea 0-4      Progression   Progression Continue to progress workloads to maintain intensity without signs/symptoms of physical distress.      Resistance Training   Training Prescription Yes    Weight 4 lb    Reps 10-15           Exercise Goals: Frequency: Be able to perform aerobic exercise two to three times per week in program working toward 2-5 days per week of home exercise.  Intensity: Work with a perceived exertion of 11 (fairly light) - 15 (hard) while following your exercise prescription.  We will make changes to your prescription with you as you progress through the program.   Duration: Be able to do 30 to 45 minutes of continuous aerobic exercise in addition to a 5 minute  warm-up and a 5 minute cool-down routine.   Nutrition Goals: Your personal nutrition goals will be established when you do your nutrition analysis with the dietician.  The following are general nutrition guidelines to follow: Cholesterol < 200mg /day Sodium < 1500mg /day Fiber: Men over 50 yrs - 30 grams per day  Personal Goals:  Personal Goals and Risk Factors at Admission - 01/12/20 1140      Core Components/Risk Factors/Patient Goals on Admission    Weight Management Yes;Weight Loss;Obesity    Intervention Weight Management: Provide education and appropriate resources to help participant work on and attain dietary goals.;Weight Management: Develop a combined nutrition and exercise program designed to reach desired caloric intake, while maintaining appropriate intake of nutrient and fiber, sodium and fats, and appropriate energy expenditure required for the weight goal.;Weight Management/Obesity: Establish reasonable short term and long term weight goals.;Obesity: Provide education and appropriate resources to help participant work on and attain dietary goals.    Admit Weight 206  lb 4.8 oz (93.6 kg)    Goal Weight: Short Term 200 lb (90.7 kg)    Goal Weight: Long Term 180 lb (81.6 kg)    Expected Outcomes Short Term: Continue to assess and modify interventions until short term weight is achieved;Long Term: Adherence to nutrition and physical activity/exercise program aimed toward attainment of established weight goal;Weight Loss: Understanding of general recommendations for a balanced deficit meal plan, which promotes 1-2 lb weight loss per week and includes a negative energy balance of [id number] kcal/d;Understanding of distribution of calorie intake throughout the day with the consumption of 4-5 meals/snacks;Understanding recommendations for meals to include 15-35% energy as protein, 25-35% energy from fat, 35-60% energy from carbohydrates, less than 200mg  of dietary cholesterol, 20-35 gm of total  fiber daily    Diabetes Yes    Intervention Provide education about signs/symptoms and action to take for hypo/hyperglycemia.;Provide education about proper nutrition, including hydration, and aerobic/resistive exercise prescription along with prescribed medications to achieve blood glucose in normal ranges: Fasting glucose 65-99 mg/dL    Expected Outcomes Short Term: Participant verbalizes understanding of the signs/symptoms and immediate care of hyper/hypoglycemia, proper foot care and importance of medication, aerobic/resistive exercise and nutrition plan for blood glucose control.;Long Term: Attainment of HbA1C < 7%.    Hypertension Yes    Intervention Provide education on lifestyle modifcations including regular physical activity/exercise, weight management, moderate sodium restriction and increased consumption of fresh fruit, vegetables, and low fat dairy, alcohol moderation, and smoking cessation.;Monitor prescription use compliance.    Expected Outcomes Short Term: Continued assessment and intervention until BP is < 140/33mm HG in hypertensive participants. < 130/37mm HG in hypertensive participants with diabetes, heart failure or chronic kidney disease.;Long Term: Maintenance of blood pressure at goal levels.    Lipids Yes    Intervention Provide education and support for participant on nutrition & aerobic/resistive exercise along with prescribed medications to achieve LDL 70mg , HDL >40mg .    Expected Outcomes Short Term: Participant states understanding of desired cholesterol values and is compliant with medications prescribed. Participant is following exercise prescription and nutrition guidelines.;Long Term: Cholesterol controlled with medications as prescribed, with individualized exercise RX and with personalized nutrition plan. Value goals: LDL < 70mg , HDL > 40 mg.           Tobacco Use Initial Evaluation: Social History   Tobacco Use  Smoking Status Former Smoker   Packs/day:  1.50   Years: 25.00   Pack years: 37.50   Types: Cigarettes   Quit date: 03/30/1997   Years since quitting: 22.8  Smokeless Tobacco Never Used    Exercise Goals and Review:  Exercise Goals    Row Name 01/12/20 1139             Exercise Goals   Increase Physical Activity Yes       Intervention Provide advice, education, support and counseling about physical activity/exercise needs.;Develop an individualized exercise prescription for aerobic and resistive training based on initial evaluation findings, risk stratification, comorbidities and participant's personal goals.       Expected Outcomes Short Term: Attend rehab on a regular basis to increase amount of physical activity.;Long Term: Add in home exercise to make exercise part of routine and to increase amount of physical activity.;Long Term: Exercising regularly at least 3-5 days a week.       Increase Strength and Stamina Yes       Intervention Provide advice, education, support and counseling about physical activity/exercise needs.;Develop an individualized exercise prescription for  aerobic and resistive training based on initial evaluation findings, risk stratification, comorbidities and participant's personal goals.       Expected Outcomes Short Term: Increase workloads from initial exercise prescription for resistance, speed, and METs.;Short Term: Perform resistance training exercises routinely during rehab and add in resistance training at home;Long Term: Improve cardiorespiratory fitness, muscular endurance and strength as measured by increased METs and functional capacity ( )       Able to understand and use rate of perceived exertion (RPE) scale Yes       Intervention Provide education and explanation on how to use RPE scale       Expected Outcomes Short Term: Able to use RPE daily in rehab to express subjective intensity level;Long Term:  Able to use RPE to guide intensity level when exercising independently       Able to  understand and use Dyspnea scale Yes       Intervention Provide education and explanation on how to use Dyspnea scale       Expected Outcomes Short Term: Able to use Dyspnea scale daily in rehab to express subjective sense of shortness of breath during exertion;Long Term: Able to use Dyspnea scale to guide intensity level when exercising independently       Knowledge and understanding of Target Heart Rate Range (THRR) Yes       Intervention Provide education and explanation of THRR including how the numbers were predicted and where they are located for reference       Expected Outcomes Short Term: Able to state/look up THRR;Short Term: Able to use daily as guideline for intensity in rehab;Long Term: Able to use THRR to govern intensity when exercising independently       Able to check pulse independently Yes       Intervention Provide education and demonstration on how to check pulse in carotid and radial arteries.;Review the importance of being able to check your own pulse for safety during independent exercise       Expected Outcomes Short Term: Able to explain why pulse checking is important during independent exercise;Long Term: Able to check pulse independently and accurately       Understanding of Exercise Prescription Yes       Intervention Provide education, explanation, and written materials on patient's individual exercise prescription       Expected Outcomes Short Term: Able to explain program exercise prescription;Long Term: Able to explain home exercise prescription to exercise independently              Copy of goals given to participant.

## 2020-01-12 NOTE — Progress Notes (Signed)
Opened in error

## 2020-01-14 ENCOUNTER — Telehealth: Payer: Self-pay | Admitting: *Deleted

## 2020-01-14 NOTE — Telephone Encounter (Signed)
-----   Message from Antonieta Iba, MD sent at 01/14/2020  3:09 PM EST ----- Regarding: RE: Cardiac Rehab EKG Okay to keep exercising, Increasing his metoprolol limited by low blood pressure on last visit If every workout associated with lots of ectopy I can place a Zio monitor to count PVC burden Thx TG ----- Message ----- From: Hazle Nordmann Sent: 01/12/2020  11:21 AM EST To: Antonieta Iba, MD Subject: Cardiac Rehab EKG                              Dr. Mariah Milling,  Chase Saunders came in for his for cardiac rehab today.  He did great.  However, we did notice that he had several PVCs during his walk and a few in quadrigeminy.  I did not see any on his EKGs in Epic and just wanted to make sure he was still good to proceed with rehab.  Thanks for your help!! Fabio Pierce, [place], CCRP 01/12/2020 11:26 AM

## 2020-01-16 ENCOUNTER — Encounter: Payer: Medicare HMO | Admitting: *Deleted

## 2020-01-16 ENCOUNTER — Other Ambulatory Visit: Payer: Self-pay

## 2020-01-16 DIAGNOSIS — Z951 Presence of aortocoronary bypass graft: Secondary | ICD-10-CM

## 2020-01-16 DIAGNOSIS — I10 Essential (primary) hypertension: Secondary | ICD-10-CM | POA: Diagnosis not present

## 2020-01-16 DIAGNOSIS — I25118 Atherosclerotic heart disease of native coronary artery with other forms of angina pectoris: Secondary | ICD-10-CM | POA: Diagnosis not present

## 2020-01-16 DIAGNOSIS — I350 Nonrheumatic aortic (valve) stenosis: Secondary | ICD-10-CM | POA: Diagnosis not present

## 2020-01-16 DIAGNOSIS — Z952 Presence of prosthetic heart valve: Secondary | ICD-10-CM

## 2020-01-16 DIAGNOSIS — E785 Hyperlipidemia, unspecified: Secondary | ICD-10-CM | POA: Diagnosis not present

## 2020-01-16 DIAGNOSIS — R42 Dizziness and giddiness: Secondary | ICD-10-CM | POA: Diagnosis not present

## 2020-01-16 LAB — GLUCOSE, CAPILLARY
Glucose-Capillary: 148 mg/dL — ABNORMAL HIGH (ref 70–99)
Glucose-Capillary: 89 mg/dL (ref 70–99)

## 2020-01-16 NOTE — Progress Notes (Signed)
Daily Session Note  Patient Details  Name: Chase Saunders MRN: [id number] Date of Birth: 14-Apr-1950 Referring Provider:     Cardiac Rehab from 01/12/2020 in [facility] Cardiac and Pulmonary Rehab  Referring Provider Ida Rogue MD      Encounter Date: 01/16/2020  Check In:  Session Check In - 01/16/20 1024      Check-In   Supervising physician immediately available to respond to emergencies See telemetry face sheet for immediately available ER MD    Location [facility]-Cardiac & Pulmonary Rehab    Staff Present Renita Papa, RN BSN;Joseph [address] [place], [place], RCEP, CCRP, CCET;Susanne Bice, RN, BSN, CCRP    Virtual Visit No    Medication changes reported     No    Fall or balance concerns reported    No    Warm-up and Cool-down Performed on first and last piece of equipment    Resistance Training Performed Yes    VAD Patient? No    PAD/SET Patient? No      Pain Assessment   Currently in Pain? No/denies              Social History   Tobacco Use  Smoking Status Former Smoker  . Packs/day: 1.50  . Years: 25.00  . Pack years: 37.50  . Types: Cigarettes  . Quit date: 03/30/1997  . Years since quitting: 22.8  Smokeless Tobacco Never Used    Goals Met:  Independence with exercise equipment Exercise tolerated well No report of cardiac concerns or symptoms Strength training completed today  Goals Unmet:  Not Applicable  Comments: First full day of exercise!  Patient was oriented to gym and equipment including functions, settings, policies, and procedures.  Patient's individual exercise prescription and treatment plan were reviewed.  All starting workloads were established based on the results of the 6 minute walk test done at initial orientation visit.  The plan for exercise progression was also introduced and progression will be customized based on patient's performance and goals.    Dr. Emily Filbert is Medical Director for [place].

## 2020-01-19 ENCOUNTER — Encounter: Payer: Self-pay | Admitting: Thoracic Surgery (Cardiothoracic Vascular Surgery)

## 2020-01-19 ENCOUNTER — Other Ambulatory Visit: Payer: Self-pay

## 2020-01-19 ENCOUNTER — Ambulatory Visit: Payer: Medicare HMO | Admitting: Thoracic Surgery (Cardiothoracic Vascular Surgery)

## 2020-01-19 ENCOUNTER — Encounter: Payer: Medicare HMO | Admitting: *Deleted

## 2020-01-19 VITALS — BP 108/66 | HR 67 | Resp 20 | Ht 69.0 in | Wt 205.0 lb

## 2020-01-19 DIAGNOSIS — I10 Essential (primary) hypertension: Secondary | ICD-10-CM | POA: Diagnosis not present

## 2020-01-19 DIAGNOSIS — I350 Nonrheumatic aortic (valve) stenosis: Secondary | ICD-10-CM | POA: Diagnosis not present

## 2020-01-19 DIAGNOSIS — Z952 Presence of prosthetic heart valve: Secondary | ICD-10-CM

## 2020-01-19 DIAGNOSIS — Z953 Presence of xenogenic heart valve: Secondary | ICD-10-CM

## 2020-01-19 DIAGNOSIS — Z951 Presence of aortocoronary bypass graft: Secondary | ICD-10-CM

## 2020-01-19 DIAGNOSIS — E785 Hyperlipidemia, unspecified: Secondary | ICD-10-CM | POA: Diagnosis not present

## 2020-01-19 DIAGNOSIS — R42 Dizziness and giddiness: Secondary | ICD-10-CM | POA: Diagnosis not present

## 2020-01-19 DIAGNOSIS — I25118 Atherosclerotic heart disease of native coronary artery with other forms of angina pectoris: Secondary | ICD-10-CM | POA: Diagnosis not present

## 2020-01-19 LAB — GLUCOSE, CAPILLARY
Glucose-Capillary: 118 mg/dL — ABNORMAL HIGH (ref 70–99)
Glucose-Capillary: 79 mg/dL (ref 70–99)

## 2020-01-19 NOTE — Progress Notes (Signed)
[address]       [name] [address]             [phone]     CARDIOTHORACIC SURGERY OFFICE NOTE  Primary Cardiologist is Julien Nordmann, MD PCP is Clovis Riley, L.[name], MD   HPI:  Patient is a 69 year old male with history of aortic stenosis, hypertension, hyperlipidemia, and type 2 diabetes mellitus who underwent aortic valve replacement using bioprosthetic tissue valve and coronary artery bypass grafting x2 on September 24, 2019 for severe symptomatic aortic stenosis with left main coronary artery disease and unstable angina pectoris.  Patient's early postoperative recovery has been uncomplicated and routine follow up echocardiogram was performed 10/28/2019.  He was last seen here in our office on 11/17/2019 at which time he was doing well.  He was seen in follow up more recently by Dr. Mariah Milling on 01/02/2020.  He returns her office today and reports that he is doing very well.  He states that he is never really had any significant pain in his chest since his surgery at all.  He is walking daily and he reports that his breathing is essentially back to normal.  Appetite is good.  Energy level is very good.  Overall he has no complaints.  He has recently started the cardiac rehab program at [facility].  Current Outpatient Medications  Medication Sig Dispense Refill  . acetaminophen (TYLENOL) 500 MG tablet Take 2 tablets (1,000 mg total) by mouth every 6 (six) hours as needed. 30 tablet 0  . aspirin EC 81 MG tablet Take 81 mg by mouth at bedtime.     Marland Kitchen CINNAMON PO Take 200 mcg by mouth daily.    . citalopram (CELEXA) 20 MG tablet Take 20 mg by mouth daily.    . clopidogrel (PLAVIX) 75 MG tablet Take 1 tablet (75 mg total) by mouth daily. 30 tablet 3  . furosemide (LASIX) 40 MG tablet Take 0.5 tablets (20 mg total) by mouth daily. 15 tablet 0  . Insulin Pen Needle 31G X 8 MM MISC Use as directed 100 each 3  . LANTUS SOLOSTAR 100 UNIT/ML Solostar Pen Inject 10-16 Units into the skin  daily. Take 20 mg in the morning and 15 mg at bedtime    . losartan (COZAAR) 25 MG tablet Take 0.5 tablets (12.5 mg total) by mouth daily. 45 tablet 3  . lovastatin (MEVACOR) 40 MG tablet Take 1 tablet (40 mg total) by mouth at bedtime. 90 tablet 3  . metFORMIN (GLUCOPHAGE-XR) 500 MG 24 hr tablet Take 500 mg by mouth at bedtime.     . metoprolol tartrate (LOPRESSOR) 25 MG tablet Take 0.5 tablets (12.5 mg total) by mouth 2 (two) times daily. 30 tablet 1  . Multiple Vitamins-Minerals (MENS MULTIVITAMIN PLUS) TABS Take by mouth daily.    . nitroGLYCERIN (NITROSTAT) 0.4 MG SL tablet Place 1 tablet (0.4 mg total) under the tongue every 5 (five) minutes as needed for chest pain. 25 tablet 3  . potassium chloride (KLOR-CON) 10 MEQ tablet Take 1 tablet (10 mEq total) by mouth 3 (three) times a week. 30 tablet 1  . sitaGLIPtin (JANUVIA) 100 MG tablet Take 1 tablet (100 mg total) by mouth daily. 30 tablet 3   No current facility-administered medications for this visit.      Physical Exam:   BP 108/66 (BP Location: Right Arm, Patient Position: Sitting, Cuff Size: Large)   Pulse 67   Resp 20   Ht 5\' 9"  (  1.753 m)   Wt 205 lb (93 kg)   SpO2 100% Comment: RA  BMI 30.27 kg/m   General:  Well-appearing  Chest:   Clear to auscultation with symmetrical breath sounds  CV:   Regular rate and rhythm without murmur  Incisions:  Completely healed, sternum is stable  Abdomen:  Soft nontender  Extremities:  Warm and well-perfused  Diagnostic Tests:  ECHOCARDIOGRAM REPORT       Patient Name:  Chase Saunders Date of Exam: 10/28/2019  Medical Rec #: [id number]     Height:    70.0 in  Accession #:  [id number]    Weight:    210.0 lb  Date of Birth: 22-Apr-1950    BSA:     2.131 m  Patient Age:  [age] years     BP:      114/68 mmHg  Patient Gender: M         HR:      88 bpm.  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 CAD, Prior CABG, Aortic Valve Disease,         Signs/Symptoms:Dizziness/Lightheadedness and Chest Pain;  Risk         Factors:Former Smoker, Hypertension, Diabetes and  Dyslipidemia.         Aortic Valve: 23 mm Edwards Inspiris Resilia bovine valve  is         present in the aortic position. Procedure Date: 09/24/19.    Sonographer:  Quentin OreGary Joseph RDMS, RVT, RDCS  Referring Phys: [id number] Deri FuellingJILL D MCDANIEL   IMPRESSIONS    1. Left ventricular ejection fraction, by estimation, is 50 to 55%. The  left ventricle has low normal function. The left ventricle has no regional  wall motion abnormalities. The left ventricular internal cavity size was  mildly dilated. There is mild  left ventricular hypertrophy. Left ventricular diastolic parameters are  consistent with Grade II diastolic dysfunction (pseudonormalization).  Elevated left atrial pressure. The average left ventricular global  longitudinal strain is -13.9 %.  2. Right ventricular systolic function is mildly reduced. The right  ventricular size is normal. There is normal pulmonary artery systolic  pressure.  3. Left atrial size was mildly dilated.  4. Right atrial size was mildly dilated.  5. The mitral valve is abnormal. Moderate mitral valve regurgitation. No  evidence of mitral stenosis.  6. The aortic valve has been repaired/replaced. Aortic valve  regurgitation is not visualized. There is a 23 mm Edwards Inspiris Resilia  bovine valve present in the aortic position. Procedure Date: 09/24/19. Echo  findings are consistent with normal  structure and function of the aortic valve prosthesis. Aortic valve mean  gradient measures 9.0 mmHg.  7. The inferior vena cava is normal in size with greater than  50%  respiratory variability, suggesting right atrial pressure of 3 mmHg.   FINDINGS  Left Ventricle: Left ventricular ejection fraction, by estimation, is 50  to 55%. The left ventricle has low normal function. The left ventricle has  no regional wall motion abnormalities. The average left ventricular global  longitudinal strain is -13.9  %. The left ventricular internal cavity size was mildly dilated. There is  mild left ventricular hypertrophy. Left ventricular diastolic parameters  are consistent with Grade II diastolic  dysfunction (pseudonormalization).  Elevated left atrial pressure.   Right Ventricle: The right ventricular size is normal. No increase in  right ventricular wall thickness. Right ventricular systolic function is  mildly reduced. There is normal pulmonary artery systolic pressure. The  tricuspid regurgitant velocity is 2.69  m/s, and with an assumed right atrial pressure of 3 mmHg, the estimated  right ventricular systolic pressure is 31.9 mmHg.   Left Atrium: Left atrial size was mildly dilated.   Right Atrium: Right atrial size was mildly dilated.   Pericardium: There is no evidence of pericardial effusion.   Mitral Valve: The mitral valve is abnormal. Mild to moderate mitral  annular calcification. Moderate mitral valve regurgitation. No evidence of  mitral valve stenosis.   Tricuspid Valve: The tricuspid valve is not well visualized. Tricuspid  valve regurgitation is trivial.   Aortic Valve: The aortic valve has been repaired/replaced. Aortic valve  regurgitation is not visualized. Aortic valve mean gradient measures 9.0  mmHg. Aortic valve peak gradient measures 17.3 mmHg. Aortic valve area, by  VTI measures 1.96 cm. There is a  23 mm Edwards Inspiris Resilia bovine valve present in the aortic  position. Procedure Date: 09/24/19. Echo findings are consistent with  normal structure and function of the aortic valve prosthesis.   Pulmonic Valve: The  pulmonic valve was grossly normal. Pulmonic valve  regurgitation is not visualized. No evidence of pulmonic stenosis.   Aorta: The aortic root is normal in size and structure.   Pulmonary Artery: The pulmonary artery is not well seen.   Venous: The inferior vena cava is normal in size with greater than 50%  respiratory variability, suggesting right atrial pressure of 3 mmHg.   IAS/Shunts: The interatrial septum was not well visualized.     LEFT VENTRICLE  PLAX 2D  LVIDd:     5.80 cm   Diastology  LVIDs:     3.50 cm   LV e' lateral:  11.90 cm/s  LV PW:     1.00 cm   LV E/e' lateral: 11.2  LV IVS:    1.10 cm   LV e' medial:  5.33 cm/s  LVOT diam:   1.80 cm   LV E/e' medial: 25.0  LV SV:     72  LV SV Index:  34      2D Longitudinal Strain  LVOT Area:   2.54 cm   2D Strain GLS Avg:   -13.9 %    LV Volumes (MOD)  LV vol d, MOD A2C: 102.8 ml  LV vol d, MOD A4C: 124.0 ml  LV vol s, MOD A2C: 51.7 ml  LV vol s, MOD A4C: 61.6 ml  LV SV MOD A2C:   51.2 ml  LV SV MOD A4C:   124.0 ml  LV SV MOD BP:   55.4 ml   RIGHT VENTRICLE      IVC  RV Basal diam: 3.70 cm  IVC diam: 1.40 cm  RV S prime:   7.72 cm/s  TAPSE (M-mode): 1.9 cm   LEFT ATRIUM       Index    RIGHT ATRIUM      Index  LA diam:    4.80 cm 2.25 cm/m RA Area:   22.60 cm  LA Vol (A2C):  88.9 ml 41.72 ml/m RA Volume:  62.20 ml 29.19 ml/m  LA Vol (A4C):  54.1 ml 25.39 ml/m  LA Biplane Vol: 74.5 ml 34.96 ml/m  AORTIC VALVE  PULMONIC VALVE  AV Area (Vmax):  1.81 cm   PV Vmax:    0.90 m/s  AV Area (Vmean):  1.72 cm   PV Peak grad: 3.3 mmHg  AV Area (VTI):   1.96 cm  AV Vmax:      208.00 cm/s  AV Vmean:     137.333 cm/s  AV VTI:      0.367 m  AV Peak Grad:   17.3 mmHg  AV Mean Grad:   9.0 mmHg  LVOT Vmax:     148.00 cm/s  LVOT Vmean:    92.800 cm/s  LVOT  VTI:     0.283 m  LVOT/AV VTI ratio: 0.77    AORTA  Ao Root diam: 3.00 cm  Ao Arch diam: 2.5 cm   MITRAL VALVE        TRICUSPID VALVE  MV Area (PHT): 4.06 cm   TR Peak grad:  28.9 mmHg  MV Decel Time: 187 msec   TR Vmax:    269.00 cm/s  MV E velocity: 133.00 cm/s  MV A velocity: 111.00 cm/s SHUNTS  MV E/A ratio: 1.20     Systemic VTI: 0.28 m               Systemic Diam: 1.80 cm   Yvonne Kendall MD  Electronically signed by Yvonne Kendall MD  Signature Date/Time: 10/29/2019/8:57:36 AM      Impression:  Patient is doing very well more than 4 months status post aortic valve replacement and coronary artery bypass grafting.  I have personally reviewed the patient's follow-up echocardiogram which demonstrates normal functioning bioprosthetic tissue valve in the aortic position with essentially normal left ventricular systolic function and what I feel is mild mitral regurgitation.   Plan:  We have not recommended any change the patient's current medications.  I have suggested that he discuss further with Dr. Mariah Milling how long he should remain on Plavix as this medication was added at the time of his surgery because of his presentation with accelerated symptoms consistent with unstable angina.  I have encouraged the patient to continue to increase his physical activity without any particular limitations.  The patient has been reminded regarding the importance of dental hygiene and the lifelong need for antibiotic prophylaxis for all dental cleanings and other related invasive procedures.  The patient will continue to follow-up regularly with Dr. Mariah Milling.  He will return to our office for routine follow-up next summer, approximately 1 year following his surgery.  He will call and return sooner should specific problems or questions arise.  I spent in excess of 15 minutes during the conduct of this office consultation and >50% of this time involved  direct face-to-face encounter with the patient for counseling and/or coordination of their care.    Salvatore Decent. Cornelius Moras, MD 01/19/2020 9:59 AM

## 2020-01-19 NOTE — Patient Instructions (Addendum)
Continue all previous medications without any changes at this time  Discuss with your primary cardiologist how long you should continue to take Plavix  You may resume unrestricted physical activity without any particular limitations at this time.  Make every effort to stay physically active, get some type of exercise on a regular basis, and stick to a "heart healthy diet".  The long term benefits for regular exercise and a healthy diet are critically important to your overall health and wellbeing.  Make every effort to keep your diabetes under very tight control.  Follow up closely with your primary care physician or endocrinologist and strive to keep their hemoglobin A1c levels as low as possible, preferably near or below 6.0.  The long term benefits of strict control of diabetes are far reaching and critically important for your overall health and survival.  Endocarditis is a potentially serious infection of heart valves or inside lining of the heart.  It occurs more commonly in patients with diseased heart valves (such as patient's with aortic or mitral valve disease) and in patients who have undergone heart valve repair or replacement.  Certain surgical and dental procedures may put you at risk, such as dental cleaning, other dental procedures, or any surgery involving the respiratory, urinary, gastrointestinal tract, gallbladder or prostate gland.   To minimize your chances for develooping endocarditis, maintain good oral health and seek prompt medical attention for any infections involving the mouth, teeth, gums, skin or urinary tract.    Always notify your doctor or dentist about your underlying heart valve condition before having any invasive procedures. You will need to take antibiotics before certain procedures, including all routine dental cleanings or other dental procedures.  Your cardiologist or dentist should prescribe these antibiotics for you to be taken ahead of time.

## 2020-01-19 NOTE — Progress Notes (Signed)
Daily Session Note  Patient Details  Name: Chase Saunders MRN: [id number] Date of Birth: August 26, 1950 Referring Provider:     Cardiac Rehab from 01/12/2020 in [facility] Cardiac and Pulmonary Rehab  Referring Provider Ida Rogue MD      Encounter Date: 01/19/2020  Check In:  Session Check In - 01/19/20 1059      Check-In   Supervising physician immediately available to respond to emergencies See telemetry face sheet for immediately available ER MD    Location [facility]-Cardiac & Pulmonary Rehab    Staff Present Renita Papa, RN BSN;Joseph Lou Miner, [place] Exercise Physiologist;Kelly Amedeo Plenty, [place], ACSM CEP, Exercise Physiologist    Virtual Visit No    Medication changes reported     No    Fall or balance concerns reported    No    Warm-up and Cool-down Performed on first and last piece of equipment    Resistance Training Performed Yes    VAD Patient? No    PAD/SET Patient? No      Pain Assessment   Currently in Pain? No/denies              Social History   Tobacco Use  Smoking Status Former Smoker  . Packs/day: 1.50  . Years: 25.00  . Pack years: 37.50  . Types: Cigarettes  . Quit date: 03/30/1997  . Years since quitting: 22.8  Smokeless Tobacco Never Used    Goals Met:  Independence with exercise equipment Exercise tolerated well No report of cardiac concerns or symptoms Strength training completed today  Goals Unmet:  Not Applicable  Comments: Pt able to follow exercise prescription today without complaint.  Will continue to monitor for progression.    Dr. Emily Filbert is Medical Director for [place] [facility].

## 2020-01-23 ENCOUNTER — Other Ambulatory Visit: Payer: Self-pay

## 2020-01-23 DIAGNOSIS — E785 Hyperlipidemia, unspecified: Secondary | ICD-10-CM | POA: Diagnosis not present

## 2020-01-23 DIAGNOSIS — I10 Essential (primary) hypertension: Secondary | ICD-10-CM | POA: Diagnosis not present

## 2020-01-23 DIAGNOSIS — R42 Dizziness and giddiness: Secondary | ICD-10-CM | POA: Diagnosis not present

## 2020-01-23 DIAGNOSIS — I350 Nonrheumatic aortic (valve) stenosis: Secondary | ICD-10-CM | POA: Diagnosis not present

## 2020-01-23 DIAGNOSIS — Z951 Presence of aortocoronary bypass graft: Secondary | ICD-10-CM | POA: Diagnosis not present

## 2020-01-23 DIAGNOSIS — I25118 Atherosclerotic heart disease of native coronary artery with other forms of angina pectoris: Secondary | ICD-10-CM | POA: Diagnosis not present

## 2020-01-23 DIAGNOSIS — Z952 Presence of prosthetic heart valve: Secondary | ICD-10-CM

## 2020-01-23 LAB — GLUCOSE, CAPILLARY: Glucose-Capillary: 122 mg/dL — ABNORMAL HIGH (ref 70–99)

## 2020-01-23 NOTE — Progress Notes (Signed)
Daily Session Note  Patient Details  Name: Chase Saunders MRN: [id number] Date of Birth: March 16, 1950 Referring Provider:     Cardiac Rehab from 01/12/2020 in [facility] Cardiac and Pulmonary Rehab  Referring Provider Ida Rogue MD      Encounter Date: 01/23/2020  Check In:  Session Check In - 01/23/20 0958      Check-In   Supervising physician immediately available to respond to emergencies See telemetry face sheet for immediately available ER MD    Location [facility]-Cardiac & Pulmonary Rehab    Staff Present Birdie Sons, MPA, RN;Joseph Darrin Nipper, [place], RCEP, CCRP, CCET    Virtual Visit No    Medication changes reported     No    Fall or balance concerns reported    No    Warm-up and Cool-down Performed on first and last piece of equipment    Resistance Training Performed Yes    VAD Patient? No    PAD/SET Patient? No      Pain Assessment   Currently in Pain? No/denies              Social History   Tobacco Use  Smoking Status Former Smoker  . Packs/day: 1.50  . Years: 25.00  . Pack years: 37.50  . Types: Cigarettes  . Quit date: 03/30/1997  . Years since quitting: 22.8  Smokeless Tobacco Never Used    Goals Met:  Independence with exercise equipment Exercise tolerated well No report of cardiac concerns or symptoms Strength training completed today  Goals Unmet:  Not Applicable  Comments: Pt able to follow exercise prescription today without complaint.  Will continue to monitor for progression.    Dr. Emily Filbert is Medical Director for [place] [facility].

## 2020-01-26 ENCOUNTER — Encounter: Payer: Medicare HMO | Admitting: *Deleted

## 2020-01-26 ENCOUNTER — Other Ambulatory Visit: Payer: Self-pay

## 2020-01-26 DIAGNOSIS — R42 Dizziness and giddiness: Secondary | ICD-10-CM | POA: Diagnosis not present

## 2020-01-26 DIAGNOSIS — E785 Hyperlipidemia, unspecified: Secondary | ICD-10-CM | POA: Diagnosis not present

## 2020-01-26 DIAGNOSIS — Z951 Presence of aortocoronary bypass graft: Secondary | ICD-10-CM | POA: Diagnosis not present

## 2020-01-26 DIAGNOSIS — Z952 Presence of prosthetic heart valve: Secondary | ICD-10-CM

## 2020-01-26 DIAGNOSIS — I350 Nonrheumatic aortic (valve) stenosis: Secondary | ICD-10-CM | POA: Diagnosis not present

## 2020-01-26 DIAGNOSIS — I10 Essential (primary) hypertension: Secondary | ICD-10-CM | POA: Diagnosis not present

## 2020-01-26 DIAGNOSIS — I25118 Atherosclerotic heart disease of native coronary artery with other forms of angina pectoris: Secondary | ICD-10-CM | POA: Diagnosis not present

## 2020-01-26 NOTE — Progress Notes (Signed)
Daily Session Note  Patient Details  Name: Chase Saunders MRN: [id number] Date of Birth: [birth year] Referring Provider:     Cardiac Rehab from 01/12/2020 in [facility] Cardiac and Pulmonary Rehab  Referring Provider Ida Rogue MD      Encounter Date: 01/26/2020  Check In:  Session Check In - 01/26/20 0953      Check-In   Supervising physician immediately available to respond to emergencies See telemetry face sheet for immediately available ER MD    Location [facility]-Cardiac & Pulmonary Rehab    Staff Present Heath Lark, RN, BSN, CCRP;Joseph Hood RCP,RRT,BSRT;Kelly [place], [place], ACSM CEP, Exercise Physiologist    Virtual Visit No    Medication changes reported     No    Fall or balance concerns reported    No    Warm-up and Cool-down Performed on first and last piece of equipment    Resistance Training Performed Yes    VAD Patient? No    PAD/SET Patient? No      Pain Assessment   Currently in Pain? No/denies              Social History   Tobacco Use  Smoking Status Former Smoker  . Packs/day: 1.50  . Years: 25.00  . Pack years: 37.50  . Types: Cigarettes  . Quit date: 03/30/1997  . Years since quitting: 22.8  Smokeless Tobacco Never Used    Goals Met:  Independence with exercise equipment Exercise tolerated well No report of cardiac concerns or symptoms  Goals Unmet:  Not Applicable  Comments: Pt able to follow exercise prescription today without complaint.  Will continue to monitor for progression.    Dr. Emily Filbert is Medical Director for [place] [facility].

## 2020-01-28 ENCOUNTER — Other Ambulatory Visit: Payer: Self-pay

## 2020-01-28 DIAGNOSIS — I25118 Atherosclerotic heart disease of native coronary artery with other forms of angina pectoris: Secondary | ICD-10-CM | POA: Diagnosis not present

## 2020-01-28 DIAGNOSIS — I10 Essential (primary) hypertension: Secondary | ICD-10-CM | POA: Diagnosis not present

## 2020-01-28 DIAGNOSIS — E785 Hyperlipidemia, unspecified: Secondary | ICD-10-CM | POA: Diagnosis not present

## 2020-01-28 DIAGNOSIS — Z951 Presence of aortocoronary bypass graft: Secondary | ICD-10-CM | POA: Diagnosis not present

## 2020-01-28 DIAGNOSIS — Z952 Presence of prosthetic heart valve: Secondary | ICD-10-CM

## 2020-01-28 DIAGNOSIS — R42 Dizziness and giddiness: Secondary | ICD-10-CM | POA: Diagnosis not present

## 2020-01-28 DIAGNOSIS — I350 Nonrheumatic aortic (valve) stenosis: Secondary | ICD-10-CM | POA: Diagnosis not present

## 2020-01-28 NOTE — Progress Notes (Signed)
Daily Session Note  Patient Details  Name: Chase Saunders MRN: [id number] Date of Birth: 07-14-1950 Referring Provider:     Cardiac Rehab from 01/12/2020 in [facility] Cardiac and Pulmonary Rehab  Referring Provider Ida Rogue MD      Encounter Date: 01/28/2020  Check In:  Session Check In - 01/28/20 1016      Check-In   Supervising physician immediately available to respond to emergencies See telemetry face sheet for immediately available ER MD    Location [facility]-Cardiac & Pulmonary Rehab    Staff Present Birdie Sons, MPA, Elveria Rising, BA, ACSM CEP, Exercise Physiologist;Kara Eliezer Bottom, MS Exercise Physiologist    Virtual Visit No    Medication changes reported     No    Fall or balance concerns reported    No    Warm-up and Cool-down Performed on first and last piece of equipment    Resistance Training Performed Yes    VAD Patient? No    PAD/SET Patient? No      Pain Assessment   Currently in Pain? No/denies              Social History   Tobacco Use  Smoking Status Former Smoker  . Packs/day: 1.50  . Years: 25.00  . Pack years: 37.50  . Types: Cigarettes  . Quit date: 03/30/1997  . Years since quitting: 22.8  Smokeless Tobacco Never Used    Goals Met:  Independence with exercise equipment Exercise tolerated well No report of cardiac concerns or symptoms Strength training completed today  Goals Unmet:  Not Applicable  Comments: Pt able to follow exercise prescription today without complaint.  Will continue to monitor for progression.    Dr. Emily Filbert is Medical Director for [place] [facility].

## 2020-01-30 ENCOUNTER — Other Ambulatory Visit: Payer: Self-pay | Admitting: Physician Assistant

## 2020-02-02 ENCOUNTER — Other Ambulatory Visit: Payer: Self-pay

## 2020-02-02 DIAGNOSIS — I350 Nonrheumatic aortic (valve) stenosis: Secondary | ICD-10-CM | POA: Diagnosis not present

## 2020-02-02 DIAGNOSIS — I25118 Atherosclerotic heart disease of native coronary artery with other forms of angina pectoris: Secondary | ICD-10-CM | POA: Diagnosis not present

## 2020-02-02 DIAGNOSIS — I10 Essential (primary) hypertension: Secondary | ICD-10-CM | POA: Diagnosis not present

## 2020-02-02 DIAGNOSIS — Z951 Presence of aortocoronary bypass graft: Secondary | ICD-10-CM

## 2020-02-02 DIAGNOSIS — Z952 Presence of prosthetic heart valve: Secondary | ICD-10-CM

## 2020-02-02 DIAGNOSIS — E785 Hyperlipidemia, unspecified: Secondary | ICD-10-CM | POA: Diagnosis not present

## 2020-02-02 DIAGNOSIS — R42 Dizziness and giddiness: Secondary | ICD-10-CM | POA: Diagnosis not present

## 2020-02-02 NOTE — Progress Notes (Signed)
Daily Session Note  Patient Details  Name: Chase Saunders MRN: [id number] Date of Birth: 1950-06-05 Referring Provider:     Cardiac Rehab from 01/12/2020 in [facility] Cardiac and Pulmonary Rehab  Referring Provider Ida Rogue MD      Encounter Date: 02/02/2020  Check In:  Session Check In - 02/02/20 0946      Check-In   Supervising physician immediately available to respond to emergencies See telemetry face sheet for immediately available ER MD    Location [facility]-Cardiac & Pulmonary Rehab    Staff Present Birdie Sons, MPA, Mauricia Area, BS, ACSM CEP, Exercise Physiologist;Joseph Tessie Fass RCP,RRT,BSRT    Virtual Visit No    Medication changes reported     No    Fall or balance concerns reported    No    Warm-up and Cool-down Performed on first and last piece of equipment    Resistance Training Performed Yes    VAD Patient? No    PAD/SET Patient? No      Pain Assessment   Currently in Pain? No/denies              Social History   Tobacco Use  Smoking Status Former Smoker  . Packs/day: 1.50  . Years: 25.00  . Pack years: 37.50  . Types: Cigarettes  . Quit date: 03/30/1997  . Years since quitting: 22.8  Smokeless Tobacco Never Used    Goals Met:  Independence with exercise equipment Exercise tolerated well No report of cardiac concerns or symptoms Strength training completed today  Goals Unmet:  Not Applicable  Comments: Pt able to follow exercise prescription today without complaint.  Will continue to monitor for progression.    Dr. Emily Filbert is Medical Director for [place] [facility].

## 2020-02-04 ENCOUNTER — Encounter: Payer: Self-pay | Admitting: *Deleted

## 2020-02-04 DIAGNOSIS — Z951 Presence of aortocoronary bypass graft: Secondary | ICD-10-CM

## 2020-02-04 NOTE — Progress Notes (Signed)
Cardiac Individual Treatment Plan  Patient Details  Name: Chase Saunders MRN: [id number] Date of Birth: [birth year] Referring Provider:     Cardiac Rehab from 01/12/2020 in [facility] Cardiac and Pulmonary Rehab  Referring Provider Julien Nordmann MD      Initial Encounter Date:    Cardiac Rehab from 01/12/2020 in [facility] Cardiac and Pulmonary Rehab  Date 01/12/20      Visit Diagnosis: S/P CABG x 2  Patient's Home Medications on Admission:  Current Outpatient Medications:  .  acetaminophen (TYLENOL) 500 MG tablet, Take 2 tablets (1,000 mg total) by mouth every 6 (six) hours as needed., Disp: 30 tablet, Rfl: 0 .  aspirin EC 81 MG tablet, Take 81 mg by mouth at bedtime. , Disp: , Rfl:  .  CINNAMON PO, Take 200 mcg by mouth daily., Disp: , Rfl:  .  citalopram (CELEXA) 20 MG tablet, Take 20 mg by mouth daily., Disp: , Rfl:  .  clopidogrel (PLAVIX) 75 MG tablet, Take 1 tablet (75 mg total) by mouth daily., Disp: 30 tablet, Rfl: 3 .  furosemide (LASIX) 40 MG tablet, Take 0.5 tablets (20 mg total) by mouth daily., Disp: 15 tablet, Rfl: 0 .  Insulin Pen Needle 31G X 8 MM MISC, Use as directed, Disp: 100 each, Rfl: 3 .  LANTUS SOLOSTAR 100 UNIT/ML Solostar Pen, Inject 10-16 Units into the skin daily. Take 20 mg in the morning and 15 mg at bedtime, Disp: , Rfl:  .  losartan (COZAAR) 25 MG tablet, Take 0.5 tablets (12.5 mg total) by mouth daily., Disp: 45 tablet, Rfl: 3 .  lovastatin (MEVACOR) 40 MG tablet, Take 1 tablet (40 mg total) by mouth at bedtime., Disp: 90 tablet, Rfl: 3 .  metFORMIN (GLUCOPHAGE-XR) 500 MG 24 hr tablet, Take 500 mg by mouth at bedtime. , Disp: , Rfl:  .  metoprolol tartrate (LOPRESSOR) 25 MG tablet, Take 0.5 tablets (12.5 mg total) by mouth 2 (two) times daily., Disp: 30 tablet, Rfl: 1 .  Multiple Vitamins-Minerals (MENS MULTIVITAMIN PLUS) TABS, Take by mouth daily., Disp: , Rfl:  .  nitroGLYCERIN (NITROSTAT) 0.4 MG SL tablet, Place 1 tablet (0.4 mg total) under the tongue  every 5 (five) minutes as needed for chest pain., Disp: 25 tablet, Rfl: 3 .  potassium chloride (KLOR-CON) 10 MEQ tablet, Take 1 tablet (10 mEq total) by mouth 3 (three) times a week., Disp: 30 tablet, Rfl: 1 .  sitaGLIPtin (JANUVIA) 100 MG tablet, Take 1 tablet (100 mg total) by mouth daily., Disp: 30 tablet, Rfl: 3  Past Medical History: Past Medical History:  Diagnosis Date  . Arthritis   . Asthma    Childhood  . Cataract   . Coronary artery disease   . Diabetes mellitus without complication (HCC)    Type II  . Heart murmur   . Hyperlipidemia   . Hypertension   . S/P aortic valve replacement with bioprosthetic valve 09/24/2019   Size 23 mm Edwards Inspiris Resilia  . 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 Severe aortic valve stenosis   . Shingles 09/2019    Tobacco Use: Social History   Tobacco Use  Smoking Status Former Smoker  . Packs/day: 1.50  . Years: 25.00  . Pack years: 37.50  . Types: Cigarettes  . Quit date: 03/30/1997  . Years since quitting: 22.8  Smokeless Tobacco Never Used    Labs: Recent Review Contractor for ITP  Cardiac and Pulmonary Rehab Latest Ref Rng & Units 09/24/2019 09/24/2019 09/24/2019 09/24/2019 09/26/2019   Cholestrol 0 - 200 mg/dL - - - - 78   LDLCALC 0 - 99 mg/dL - - - - 37   HDL >96 mg/dL - - - - 29(B)   Trlycerides <150 mg/dL - - - - 67   Hemoglobin A1c 4.8 - 5.6 % - - - - -   PHART 7.35 - 7.45 - 7.330(L) 7.299(L) 7.280(L) -   PCO2ART 32 - 48 mmHg - 49.7(H) 51.6(H) 41.1 -   HCO3 20.0 - 28.0 mmol/L - 26.3 25.1 19.1(L) -   TCO2 22 - 32 mmol/L 27 28 27  20(L) -   ACIDBASEDEF 0.0 - 2.0 mmol/L - - 1.0 7.0(H) -   O2SAT % - 96.0 94.0 93.0 -       Exercise Target Goals: Exercise Program Goal: Individual exercise prescription set using results from initial 6 min walk test and THRR while considering  patient's activity barriers and safety.   Exercise Prescription Goal: Initial exercise prescription builds to 30-45  minutes a day of aerobic activity, 2-3 days per week.  Home exercise guidelines will be given to patient during program as part of exercise prescription that the participant will acknowledge.   Education: Aerobic Exercise: - Group verbal and visual presentation on the components of exercise prescription. Introduces F.I.T.T principle from ACSM for exercise prescriptions.  Reviews F.I.T.T. principles of aerobic exercise including progression. Written material given at graduation.   Education: Resistance Exercise: - Group verbal and visual presentation on the components of exercise prescription. Introduces F.I.T.T principle from ACSM for exercise prescriptions  Reviews F.I.T.T. principles of resistance exercise including progression. Written material given at graduation.    Education: Exercise & Equipment Safety: - Individual verbal instruction and demonstration of equipment use and safety with use of the equipment.   Cardiac Rehab from 01/28/2020 in [facility] Cardiac and Pulmonary Rehab  Date 01/08/20  Educator [facility]  Instruction Review Code 1- Verbalizes Understanding      Education: Exercise Physiology & General Exercise Guidelines: - Group verbal and written instruction with models to review the exercise physiology of the cardiovascular system and associated critical values. Provides general exercise guidelines with specific guidelines to those with heart or lung disease.    Cardiac Rehab from 01/28/2020 in [facility] Cardiac and Pulmonary Rehab  Education need identified 01/12/20      Education: Flexibility, Balance, Mind/Body Relaxation: - Group verbal and visual presentation with interactive activity on the components of exercise prescription. Introduces F.I.T.T principle from ACSM for exercise prescriptions. Reviews F.I.T.T. principles of flexibility and balance exercise training including progression. Also discusses the mind body connection.  Reviews various relaxation techniques to help reduce  and manage stress (i.e. Deep breathing, progressive muscle relaxation, and visualization). Balance handout provided to take home. Written material given at graduation.   Activity Barriers & Risk Stratification:  Activity Barriers & Cardiac Risk Stratification - 01/12/20 1119      Activity Barriers & Cardiac Risk Stratification   Activity Barriers Muscular Weakness;Deconditioning;Shortness of Breath;History of Falls;Other (comment)    Comments increased ectopy with exercise (note sent to cardiologist)    Cardiac Risk Stratification High           6 Minute Walk:  6 Minute Walk    Row Name 01/12/20 1117         6 Minute Walk   Phase Initial     Distance 1079 feet     Walk Time 6  minutes     # of Rest Breaks 0     MPH 2.04     METS 2.75     RPE 12     Perceived Dyspnea  1     VO2 Peak 9.63     Symptoms Yes (comment)     Comments hip pain 5/10, SOB from mask     Resting HR 75 bpm     Resting BP 126/70     Resting Oxygen Saturation  95 %     Exercise Oxygen Saturation  during 6 min walk 96 %     Max Ex. HR 112 bpm     Max Ex. BP 158/64     2 Minute Post BP 112/66            Oxygen Initial Assessment:   Oxygen Re-Evaluation:   Oxygen Discharge (Final Oxygen Re-Evaluation):   Initial Exercise Prescription:  Initial Exercise Prescription - 01/12/20 1100      Date of Initial Exercise RX and Referring Provider   Date 01/12/20    Referring Provider Julien Nordmann MD      Treadmill   MPH 2    Grade 0.5    Minutes 15    METs 2.67      Recumbant Bike   Level 2    RPM 50    Watts 22    Minutes 15    METs 2      NuStep   Level 2    SPM 80    Minutes 15    METs 2      Arm Ergometer   Level 2    Watts 40    RPM 25    Minutes 15    METs 2      Biostep-RELP   Level 2    SPM 50    Minutes 15    METs 2      Prescription Details   Frequency (times per week) 3    Duration Progress to 30 minutes of continuous aerobic without signs/symptoms of  physical distress      Intensity   THRR 40-80% of Max Heartrate 105-136    Ratings of Perceived Exertion 11-13    Perceived Dyspnea 0-4      Progression   Progression Continue to progress workloads to maintain intensity without signs/symptoms of physical distress.      Resistance Training   Training Prescription Yes    Weight 4 lb    Reps 10-15           Perform Capillary Blood Glucose checks as needed.  Exercise Prescription Changes:  Exercise Prescription Changes    Row Name 01/12/20 1100 01/19/20 1300 02/02/20 1200         Response to Exercise   Blood Pressure (Admit) 126/70 104/58 108/60     Blood Pressure (Exercise) 158/64 126/58 128/78     Blood Pressure (Exit) 112/66 100/64 96/54     Heart Rate (Admit) 75 bpm 86 bpm 84 bpm     Heart Rate (Exercise) 112 bpm 99 bpm 103 bpm     Heart Rate (Exit) 81 bpm 82 bpm 91 bpm     Oxygen Saturation (Admit) 95 % -- --     Oxygen Saturation (Exercise) 96 % -- --     Rating of Perceived Exertion (Exercise) 12 13 13      Perceived Dyspnea (Exercise) 1 -- --     Symptoms hip pain 5/10 none none  Comments walk test results second full day of exercise --     Duration -- Progress to 30 minutes of  aerobic without signs/symptoms of physical distress Progress to 30 minutes of  aerobic without signs/symptoms of physical distress     Intensity -- THRR unchanged THRR unchanged       Progression   Progression -- Continue to progress workloads to maintain intensity without signs/symptoms of physical distress. Continue to progress workloads to maintain intensity without signs/symptoms of physical distress.     Average METs -- 2.65 2.4       Resistance Training   Training Prescription -- Yes Yes     Weight -- 4 lb 4 lb     Reps -- 10-15 10-15       Interval Training   Interval Training -- No No       Treadmill   MPH -- 2 --     Grade -- 0.5 --     Minutes -- 15 --     METs -- 2.67 --       Recumbant Bike   Level -- -- 4      RPM -- -- 50     Minutes -- -- 15     METs -- -- 2.74       NuStep   Level -- 2 --     Minutes -- 15 --     METs -- 2.3 --       Arm Ergometer   Level -- -- 3     RPM -- -- 25     Minutes -- -- 15       Biostep-RELP   Level -- 2 --     Minutes -- 15 --     METs -- 3 --            Exercise Comments:  Exercise Comments    Row Name 01/14/20 1522           Exercise Comments Note sent to MD about increase ectopy during . Return note: Okay to keep exercising, Increasing his metoprolol limited by low blood pressure on last visit If every workout associated with lots of ectopy I can place a Zio monitor to count PVC burden              Exercise Goals and Review:  Exercise Goals    Row Name 01/12/20 1139             Exercise Goals   Increase Physical Activity Yes       Intervention Provide advice, education, support and counseling about physical activity/exercise needs.;Develop an individualized exercise prescription for aerobic and resistive training based on initial evaluation findings, risk stratification, comorbidities and participant's personal goals.       Expected Outcomes Short Term: Attend rehab on a regular basis to increase amount of physical activity.;Long Term: Add in home exercise to make exercise part of routine and to increase amount of physical activity.;Long Term: Exercising regularly at least 3-5 days a week.       Increase Strength and Stamina Yes       Intervention Provide advice, education, support and counseling about physical activity/exercise needs.;Develop an individualized exercise prescription for aerobic and resistive training based on initial evaluation findings, risk stratification, comorbidities and participant's personal goals.       Expected Outcomes Short Term: Increase workloads from initial exercise prescription for resistance, speed, and METs.;Short Term: Perform resistance training exercises routinely during rehab and add in  resistance training at home;Long Term: Improve cardiorespiratory fitness, muscular endurance and strength as measured by increased METs and functional capacity ( )       Able to understand and use rate of perceived exertion (RPE) scale Yes       Intervention Provide education and explanation on how to use RPE scale       Expected Outcomes Short Term: Able to use RPE daily in rehab to express subjective intensity level;Long Term:  Able to use RPE to guide intensity level when exercising independently       Able to understand and use Dyspnea scale Yes       Intervention Provide education and explanation on how to use Dyspnea scale       Expected Outcomes Short Term: Able to use Dyspnea scale daily in rehab to express subjective sense of shortness of breath during exertion;Long Term: Able to use Dyspnea scale to guide intensity level when exercising independently       Knowledge and understanding of Target Heart Rate Range (THRR) Yes       Intervention Provide education and explanation of THRR including how the numbers were predicted and where they are located for reference       Expected Outcomes Short Term: Able to state/look up THRR;Short Term: Able to use daily as guideline for intensity in rehab;Long Term: Able to use THRR to govern intensity when exercising independently       Able to check pulse independently Yes       Intervention Provide education and demonstration on how to check pulse in carotid and radial arteries.;Review the importance of being able to check your own pulse for safety during independent exercise       Expected Outcomes Short Term: Able to explain why pulse checking is important during independent exercise;Long Term: Able to check pulse independently and accurately       Understanding of Exercise Prescription Yes       Intervention Provide education, explanation, and written materials on patient's individual exercise prescription       Expected Outcomes Short Term: Able to  explain program exercise prescription;Long Term: Able to explain home exercise prescription to exercise independently              Exercise Goals Re-Evaluation :  Exercise Goals Re-Evaluation    Row Name 01/16/20 1026 01/19/20 1315 02/02/20 1218         Exercise Goal Re-Evaluation   Exercise Goals Review Increase Physical Activity;Able to understand and use rate of perceived exertion (RPE) scale;Knowledge and understanding of Target Heart Rate Range (THRR);Understanding of Exercise Prescription;Increase Strength and Stamina;Able to check pulse independently Increase Physical Activity;Increase Strength and Stamina;Understanding of Exercise Prescription Increase Physical Activity;Increase Strength and Stamina     Comments Reviewed RPE and dyspnea scales, THR and program prescription with pt today.  Pt voiced understanding and was given a copy of goals to take home. Skip is off to a good start for rehab.  He has completed his first two full days of exercise.  We will continue to montior his progress. Alisha attends consistently and works in lower end of THR range.  Staff will encourage working in [facility] each time.     Expected Outcomes Short: Use RPE daily to regulate intensity. Long: Follow program prescription in THR. Short: Attend rehab regularly Long: Continue to follow program prescription Short: reach THR range each session Long: continue to build stamina            Discharge  Exercise Prescription (Final Exercise Prescription Changes):  Exercise Prescription Changes - 02/02/20 1200      Response to Exercise   Blood Pressure (Admit) 108/60    Blood Pressure (Exercise) 128/78    Blood Pressure (Exit) 96/54    Heart Rate (Admit) 84 bpm    Heart Rate (Exercise) 103 bpm    Heart Rate (Exit) 91 bpm    Rating of Perceived Exertion (Exercise) 13    Symptoms none    Duration Progress to 30 minutes of  aerobic without signs/symptoms of physical distress    Intensity THRR unchanged       Progression   Progression Continue to progress workloads to maintain intensity without signs/symptoms of physical distress.    Average METs 2.4      Resistance Training   Training Prescription Yes    Weight 4 lb    Reps 10-15      Interval Training   Interval Training No      Recumbant Bike   Level 4    RPM 50    Minutes 15    METs 2.74      Arm Ergometer   Level 3    RPM 25    Minutes 15           Nutrition:  Target Goals: Understanding of nutrition guidelines, daily intake of sodium 1500mg , cholesterol 200mg , calories 30% from fat and 7% or less from saturated fats, daily to have 5 or more servings of fruits and vegetables.  Education: All About Nutrition: -Group instruction provided by verbal, written material, interactive activities, discussions, models, and posters to present general guidelines for heart healthy nutrition including fat, fiber, MyPlate, the role of sodium in heart healthy nutrition, utilization of the nutrition label, and utilization of this knowledge for meal planning. Follow up email sent as well. Written material given at graduation.   Cardiac Rehab from 01/28/2020 in [facility] Cardiac and Pulmonary Rehab  Education need identified 01/12/20      Biometrics:  Pre Biometrics - 01/12/20 1139      Pre Biometrics   Height 5' 8.9" (1.75 m)    Weight 206 lb 4.8 oz (93.6 kg)    BMI (Calculated) 30.56    Single Leg Stand 29.4 seconds            Nutrition Therapy Plan and Nutrition Goals:  Nutrition Therapy & Goals - 01/26/20 1010      Nutrition Therapy   Diet Heart healthy, low Na, diabetes friendly    Protein (specify units) 75g    Fiber 30 grams    Whole Grain Foods 3 servings    Saturated Fats 12 max. grams    Fruits and Vegetables 8 servings/day    Sodium 1.5 grams      Personal Nutrition Goals   Nutrition Goal ST: Add hearty snack or small lunch with protein, vegetables and whole grains such as egg salad sandwich with carrots or tuna  salad on whole wheat. Add nuts/seeds or peanut butter to animal cracker snack to improve BG responseLT: He wants to lose 30lbs, A1C at or below 7 (now 8.2).    Comments B: 2 packs of instant grits and boiled egg with banana and sometimes second piece of fruit. S: handful of animal crackers. S2: cheese and wheat crackers D: ex: beef stew over rice, imitation crab salad. S: sugar-free posicles. Drinks at least one 12 oz bottle of mineral water. Drinks: tries to drink water during the day. Fruits/vegetables: green vegetables, bananas,  potatoes, mandarines, canned fruit - staying away from now due to sugar. Discussed heart healthy eating as well as diabetes friendly eating.      Intervention Plan   Intervention Nutrition handout(s) given to patient.;Prescribe, educate and counsel regarding individualized specific dietary modifications aiming towards targeted core components such as weight, hypertension, lipid management, diabetes, heart failure and other comorbidities.    Expected Outcomes Short Term Goal: Understand basic principles of dietary content, such as calories, fat, sodium, cholesterol and nutrients.;Short Term Goal: A plan has been developed with personal nutrition goals set during dietitian appointment.;Long Term Goal: Adherence to prescribed nutrition plan.           Nutrition Assessments:  Nutrition Assessments - 01/12/20 0816      MEDFICTS Scores   Pre Score 23          MEDIFICTS Score Key:  ?70 Need to make dietary changes   40-70 Heart Healthy Diet  ? 40 Therapeutic Level Cholesterol Diet   Picture Your Plate Scores:  <91 Unhealthy dietary pattern with much room for improvement.  41-50 Dietary pattern unlikely to meet recommendations for good health and room for improvement.  51-60 More healthful dietary pattern, with some room for improvement.   >60 Healthy dietary pattern, although there may be some specific behaviors that could be improved.    Nutrition Goals  Re-Evaluation:   Nutrition Goals Discharge (Final Nutrition Goals Re-Evaluation):   Psychosocial: Target Goals: Acknowledge presence or absence of significant depression and/or stress, maximize coping skills, provide positive support system. Participant is able to verbalize types and ability to use techniques and skills needed for reducing stress and depression.   Education: Stress, Anxiety, and Depression - Group verbal and visual presentation to define topics covered.  Reviews how body is impacted by stress, anxiety, and depression.  Also discusses healthy ways to reduce stress and to treat/manage anxiety and depression.  Written material given at graduation.   Education: Sleep Hygiene -Provides group verbal and written instruction about how sleep can affect your health.  Define sleep hygiene, discuss sleep cycles and impact of sleep habits. Review good sleep hygiene tips.    Initial Review & Psychosocial Screening:  Initial Psych Review & Screening - 01/08/20 1009      Initial Review   Current issues with None Identified      Family Dynamics   Good Support System? Yes    Comments He can look to his wife, daughter and grandson for support. Harsh has a positive outlook on his health.      Barriers   Psychosocial barriers to participate in program The patient should benefit from training in stress management and relaxation.;There are no identifiable barriers or psychosocial needs.      Screening Interventions   Interventions Encouraged to exercise;Provide feedback about the scores to participant;To provide support and resources with identified psychosocial needs    Expected Outcomes Short Term goal: Utilizing psychosocial counselor, staff and physician to assist with identification of specific Stressors or current issues interfering with healing process. Setting desired goal for each stressor or current issue identified.;Long Term Goal: Stressors or current issues are controlled or  eliminated.;Short Term goal: Identification and review with participant of any Quality of Life or Depression concerns found by scoring the questionnaire.;Long Term goal: The participant improves quality of Life and PHQ9 Scores as seen by post scores and/or verbalization of changes           Quality of Life Scores:   Quality of Life -  01/12/20 0815      Quality of Life   Select Quality of Life      Quality of Life Scores   Health/Function Pre 28.23 %    Socioeconomic Pre 26.62 %    Psych/Spiritual Pre 29.28 %    Family Pre 30 %    GLOBAL Pre 28.32 %          Scores of 19 and below usually indicate a poorer quality of life in these areas.  A difference of  2-3 points is a clinically meaningful difference.  A difference of 2-3 points in the total score of the Quality of Life Index has been associated with significant improvement in overall quality of life, self-image, physical symptoms, and general health in studies assessing change in quality of life.  PHQ-9: Recent Review Flowsheet Data    Depression screen [facility] 2/9 01/12/2020   Decreased Interest 0   Down, Depressed, Hopeless 0   PHQ - 2 Score 0   Altered sleeping 0   Tired, decreased energy 1   Change in appetite 0   Feeling bad or failure about yourself  0   Trouble concentrating 0   Moving slowly or fidgety/restless 0   Suicidal thoughts 0   PHQ-9 Score 1   Difficult doing work/chores Not difficult at all     Interpretation of Total Score  Total Score Depression Severity:  1-4 = Minimal depression, 5-9 = Mild depression, 10-14 = Moderate depression, 15-19 = Moderately severe depression, 20-27 = Severe depression   Psychosocial Evaluation and Intervention:  Psychosocial Evaluation - 01/08/20 1011      Psychosocial Evaluation & Interventions   Interventions Encouraged to exercise with the program and follow exercise prescription;Relaxation education    Comments He can look to his wife, daughter and grandson for  support. Rik has a positive outlook on his health.    Expected Outcomes Short: Exercise regularly to support mental health and notify staff of any changes. Long: maintain mental health and well being through teaching of rehab or prescribed medications independently.    Continue Psychosocial Services  Follow up required by staff           Psychosocial Re-Evaluation:   Psychosocial Discharge (Final Psychosocial Re-Evaluation):   Vocational Rehabilitation: Provide vocational rehab assistance to qualifying candidates.   Vocational Rehab Evaluation & Intervention:   Education: Education Goals: Education classes will be provided on a variety of topics geared toward better understanding of heart health and risk factor modification. Participant will state understanding/return demonstration of topics presented as noted by education test scores.  Learning Barriers/Preferences:  Learning Barriers/Preferences - 01/08/20 1009      Learning Barriers/Preferences   Learning Barriers None    Learning Preferences None           General Cardiac Education Topics:  AED/CPR: - Group verbal and written instruction with the use of models to demonstrate the basic use of the AED with the basic ABC's of resuscitation.   Anatomy and Cardiac Procedures: - Group verbal and visual presentation and models provide information about basic cardiac anatomy and function. Reviews the testing methods done to diagnose heart disease and the outcomes of the test results. Describes the treatment choices: Medical Management, Angioplasty, or Coronary Bypass Surgery for treating various heart conditions including Myocardial Infarction, Angina, Valve Disease, and Cardiac Arrhythmias.  Written material given at graduation.   Cardiac Rehab from 01/28/2020 in [facility] Cardiac and Pulmonary Rehab  Education need identified 01/12/20  Medication Safety: - Group verbal and visual instruction to review commonly  prescribed medications for heart and lung disease. Reviews the medication, class of the drug, and side effects. Includes the steps to properly store meds and maintain the prescription regimen.  Written material given at graduation.   Intimacy: - Group verbal instruction through game format to discuss how heart and lung disease can affect sexual intimacy. Written material given at graduation..   Know Your Numbers and Heart Failure: - Group verbal and visual instruction to discuss disease risk factors for cardiac and pulmonary disease and treatment options.  Reviews associated critical values for Overweight/Obesity, Hypertension, Cholesterol, and Diabetes.  Discusses basics of heart failure: signs/symptoms and treatments.  Introduces Heart Failure Zone chart for action plan for heart failure.  Written material given at graduation.   Infection Prevention: - Provides verbal and written material to individual with discussion of infection control including proper hand washing and proper equipment cleaning during exercise session.   Cardiac Rehab from 01/28/2020 in [facility] Cardiac and Pulmonary Rehab  Date 01/08/20  Educator [facility]  Instruction Review Code 1- Verbalizes Understanding      Falls Prevention: - Provides verbal and written material to individual with discussion of falls prevention and safety.   Cardiac Rehab from 01/28/2020 in [facility] Cardiac and Pulmonary Rehab  Date 01/08/20  Educator [facility]  Instruction Review Code 1- Verbalizes Understanding      Other: -Provides group and verbal instruction on various topics (see comments)   Knowledge Questionnaire Score:  Knowledge Questionnaire Score - 01/12/20 0816      Knowledge Questionnaire Score   Pre Score 23/26 Education Focus: Exercise, Nutrition, Angina           Core Components/Risk Factors/Patient Goals at Admission:  Personal Goals and Risk Factors at Admission - 01/12/20 1140      Core Components/Risk Factors/Patient Goals on  Admission    Weight Management Yes;Weight Loss;Obesity    Intervention Weight Management: Provide education and appropriate resources to help participant work on and attain dietary goals.;Weight Management: Develop a combined nutrition and exercise program designed to reach desired caloric intake, while maintaining appropriate intake of nutrient and fiber, sodium and fats, and appropriate energy expenditure required for the weight goal.;Weight Management/Obesity: Establish reasonable short term and long term weight goals.;Obesity: Provide education and appropriate resources to help participant work on and attain dietary goals.    Admit Weight 206 lb 4.8 oz (93.6 kg)    Goal Weight: Short Term 200 lb (90.7 kg)    Goal Weight: Long Term 180 lb (81.6 kg)    Expected Outcomes Short Term: Continue to assess and modify interventions until short term weight is achieved;Long Term: Adherence to nutrition and physical activity/exercise program aimed toward attainment of established weight goal;Weight Loss: Understanding of general recommendations for a balanced deficit meal plan, which promotes 1-2 lb weight loss per week and includes a negative energy balance of [phone] kcal/d;Understanding of distribution of calorie intake throughout the day with the consumption of 4-5 meals/snacks;Understanding recommendations for meals to include 15-35% energy as protein, 25-35% energy from fat, 35-60% energy from carbohydrates, less than 200mg  of dietary cholesterol, 20-35 gm of total fiber daily    Diabetes Yes    Intervention Provide education about signs/symptoms and action to take for hypo/hyperglycemia.;Provide education about proper nutrition, including hydration, and aerobic/resistive exercise prescription along with prescribed medications to achieve blood glucose in normal ranges: Fasting glucose 65-99 mg/dL    Expected Outcomes Short Term: Participant verbalizes understanding  of the signs/symptoms and immediate care of  hyper/hypoglycemia, proper foot care and importance of medication, aerobic/resistive exercise and nutrition plan for blood glucose control.;Long Term: Attainment of HbA1C < 7%.    Hypertension Yes    Intervention Provide education on lifestyle modifcations including regular physical activity/exercise, weight management, moderate sodium restriction and increased consumption of fresh fruit, vegetables, and low fat dairy, alcohol moderation, and smoking cessation.;Monitor prescription use compliance.    Expected Outcomes Short Term: Continued assessment and intervention until BP is < 140/3890mm HG in hypertensive participants. < 130/10480mm HG in hypertensive participants with diabetes, heart failure or chronic kidney disease.;Long Term: Maintenance of blood pressure at goal levels.    Lipids Yes    Intervention Provide education and support for participant on nutrition & aerobic/resistive exercise along with prescribed medications to achieve LDL 70mg , HDL >40mg .    Expected Outcomes Short Term: Participant states understanding of desired cholesterol values and is compliant with medications prescribed. Participant is following exercise prescription and nutrition guidelines.;Long Term: Cholesterol controlled with medications as prescribed, with individualized exercise RX and with personalized nutrition plan. Value goals: LDL < 70mg , HDL > 40 mg.           Education:Diabetes - Individual verbal and written instruction to review signs/symptoms of diabetes, desired ranges of glucose level fasting, after meals and with exercise. Acknowledge that pre and post exercise glucose checks will be done for 3 sessions at entry of program.   Card[facility]28/[facility]  Date 01/08/20  [facility]  Instruction Review Code 1- Verbalizes Understanding      Core Components/Risk Factors/Patient Goals Review:    Core Components/Risk Factors/Patient Goals at Discharge (Final Review):     ITP Comments:  ITP Comments    Row Name 01/08/20 1013 01/12/20 1117 01/14/20 1522 01/16/20 1025 02/04/20 1034   ITP Comments Virtual Visit completed. Patient informed on EP and RD appointment and 6 Minute walk test. Patient also informed of patient health questionnaires on My Chart. Patient Verbalizes understanding. [facility]. Completed 6MWT and gym orientation. Initial ITP created and sent for review to Dr. Bethann PunchesMark Miller, Medical Director. Note sent to MD about increase ectopy during 6MWT.  Return note: Okay to keep exercising, Increasing his metoprolol limited by low blood pressure on last visit If every workout associated with lots of ectopy I can place a Zio monitor to count PVC burden First full day of exercise!  Patient was oriented to gym and equipment including functions, settings, policies, and procedures.  Patient's individual exercise prescription and treatment plan were reviewed.  All starting workloads were established based on the results of the 6 minute walk test done at initial orientation visit.  The plan for exercise progression was also introduced and progression will be customized based on patient's performance and goals. 30 Day review completed. Medical Director ITP review done, changes made as directed, and signed approval by Medical Director. New to program          Comments:

## 2020-02-06 ENCOUNTER — Encounter: Payer: Medicare HMO | Attending: Cardiovascular Disease

## 2020-02-06 ENCOUNTER — Other Ambulatory Visit: Payer: Self-pay

## 2020-02-06 DIAGNOSIS — Z952 Presence of prosthetic heart valve: Secondary | ICD-10-CM | POA: Diagnosis not present

## 2020-02-06 DIAGNOSIS — Z951 Presence of aortocoronary bypass graft: Secondary | ICD-10-CM | POA: Diagnosis not present

## 2020-02-06 NOTE — Progress Notes (Signed)
Daily Session Note  Patient Details  Name: Chase Saunders MRN: [id number] Date of Birth: May 03, 1950 Referring Provider:     Cardiac Rehab from 01/12/2020 in [facility] Cardiac and Pulmonary Rehab  Referring Provider Ida Rogue MD      Encounter Date: 02/06/2020  Check In:  Session Check In - 02/06/20 0957      Check-In   Supervising physician immediately available to respond to emergencies See telemetry face sheet for immediately available ER MD    Location [facility]-Cardiac & Pulmonary Rehab    Staff Present Birdie Sons, MPA, RN;Laureen Owens Shark, BS, RRT, CPFT;Meredith Sherryll Burger, RN BSN;Joseph Hood RCP,RRT,BSRT    Virtual Visit No    Medication changes reported     No    Fall or balance concerns reported    No    Warm-up and Cool-down Performed on first and last piece of equipment    Resistance Training Performed Yes    VAD Patient? No    PAD/SET Patient? No      Pain Assessment   Currently in Pain? No/denies              Social History   Tobacco Use  Smoking Status Former Smoker  . Packs/day: 1.50  . Years: 25.00  . Pack years: 37.50  . Types: Cigarettes  . Quit date: 03/30/1997  . Years since quitting: 22.8  Smokeless Tobacco Never Used    Goals Met:  Independence with exercise equipment Exercise tolerated well No report of cardiac concerns or symptoms Strength training completed today  Goals Unmet:  Not Applicable  Comments: Pt able to follow exercise prescription today without complaint.  Will continue to monitor for progression.    Dr. Emily Filbert is Medical Director for [place] and [facility].

## 2020-02-09 ENCOUNTER — Other Ambulatory Visit: Payer: Self-pay | Admitting: Family

## 2020-02-09 ENCOUNTER — Other Ambulatory Visit: Payer: Self-pay

## 2020-02-09 ENCOUNTER — Encounter: Payer: Medicare HMO | Admitting: *Deleted

## 2020-02-09 DIAGNOSIS — Z952 Presence of prosthetic heart valve: Secondary | ICD-10-CM | POA: Diagnosis not present

## 2020-02-09 DIAGNOSIS — Z951 Presence of aortocoronary bypass graft: Secondary | ICD-10-CM

## 2020-02-09 NOTE — Progress Notes (Signed)
Daily Session Note  Patient Details  Name: Chase Saunders MRN: [id number] Date of Birth: 21-Sep-1950 Referring Provider:     Cardiac Rehab from 01/12/2020 in [facility] Cardiac and Pulmonary Rehab  Referring Provider Ida Rogue MD      Encounter Date: 02/09/2020  Check In:  Session Check In - 02/09/20 1039      Check-In   Supervising physician immediately available to respond to emergencies See telemetry face sheet for immediately available ER MD    Location [facility]-Cardiac & Pulmonary Rehab    Staff Present Renita Papa, RN Moises Blood, BS, ACSM CEP, Exercise Physiologist;Amanda Oletta Darter, [place], ACSM CEP, Exercise Physiologist    Virtual Visit No    Medication changes reported     No    Fall or balance concerns reported    No    Warm-up and Cool-down Performed on first and last piece of equipment    Resistance Training Performed Yes    VAD Patient? No    PAD/SET Patient? No      Pain Assessment   Currently in Pain? No/denies              Social History   Tobacco Use  Smoking Status Former Smoker  . Packs/day: 1.50  . Years: 25.00  . Pack years: 37.50  . Types: Cigarettes  . Quit date: 03/30/1997  . Years since quitting: 22.8  Smokeless Tobacco Never Used    Goals Met:  Independence with exercise equipment Exercise tolerated well No report of cardiac concerns or symptoms Strength training completed today  Goals Unmet:  Not Applicable  Comments: Pt able to follow exercise prescription today without complaint.  Will continue to monitor for progression.    Dr. Emily Filbert is Medical Director for [place] and [facility].

## 2020-02-11 ENCOUNTER — Other Ambulatory Visit: Payer: Self-pay

## 2020-02-11 DIAGNOSIS — Z952 Presence of prosthetic heart valve: Secondary | ICD-10-CM

## 2020-02-11 DIAGNOSIS — Z951 Presence of aortocoronary bypass graft: Secondary | ICD-10-CM

## 2020-02-11 NOTE — Progress Notes (Signed)
Daily Session Note  Patient Details  Name: Chase Saunders MRN: [id number] Date of Birth: [birth year] Referring Provider:     Cardiac Rehab from 01/12/2020 in [facility] Cardiac and Pulmonary Rehab  Referring Provider Ida Rogue MD      Encounter Date: 02/11/2020  Check In:  Session Check In - 02/11/20 0957      Check-In   Supervising physician immediately available to respond to emergencies See telemetry face sheet for immediately available ER MD    Location [facility]-Cardiac & Pulmonary Rehab    Staff Present Birdie Sons, MPA, Elveria Rising, BA, ACSM CEP, Exercise Physiologist;Joseph Tessie Fass RCP,RRT,BSRT    Virtual Visit No    Medication changes reported     No    Fall or balance concerns reported    No    Warm-up and Cool-down Performed on first and last piece of equipment    Resistance Training Performed Yes    VAD Patient? No    PAD/SET Patient? No      Pain Assessment   Currently in Pain? No/denies              Social History   Tobacco Use  Smoking Status Former Smoker  . Packs/day: 1.50  . Years: 25.00  . Pack years: 37.50  . Types: Cigarettes  . Quit date: 03/30/1997  . Years since quitting: 22.8  Smokeless Tobacco Never Used    Goals Met:  Independence with exercise equipment Exercise tolerated well No report of cardiac concerns or symptoms Strength training completed today  Goals Unmet:  Not Applicable  Comments: Pt able to follow exercise prescription today without complaint.  Will continue to monitor for progression.    Dr. Emily Filbert is Medical Director for [place] and [facility].

## 2020-02-14 DIAGNOSIS — S42291A Other displaced fracture of upper end of right humerus, initial encounter for closed fracture: Secondary | ICD-10-CM | POA: Diagnosis not present

## 2020-02-14 DIAGNOSIS — M25562 Pain in left knee: Secondary | ICD-10-CM | POA: Diagnosis not present

## 2020-02-16 DIAGNOSIS — S42291A Other displaced fracture of upper end of right humerus, initial encounter for closed fracture: Secondary | ICD-10-CM | POA: Diagnosis not present

## 2020-02-18 ENCOUNTER — Telehealth: Payer: Self-pay | Admitting: *Deleted

## 2020-02-18 DIAGNOSIS — S8002XA Contusion of left knee, initial encounter: Secondary | ICD-10-CM | POA: Diagnosis not present

## 2020-02-18 DIAGNOSIS — S42211A Unspecified displaced fracture of surgical neck of right humerus, initial encounter for closed fracture: Secondary | ICD-10-CM | POA: Diagnosis not present

## 2020-02-18 NOTE — Telephone Encounter (Signed)
Returned our call and will need to discharge.  He will be out for 6-8 weeks to allow shoulder to heal.

## 2020-02-18 NOTE — Progress Notes (Signed)
Cardiac Individual Treatment Plan  Patient Details  Name: Chase Saunders MRN: [id number] Date of Birth: 09/19/1950 Referring Provider:   Flowsheet Row Cardiac Rehab from 01/12/2020 in [facility] Cardiac and Pulmonary Rehab  Referring Provider Julien Nordmann MD      Initial Encounter Date:  Flowsheet Row Cardiac Rehab from 01/12/2020 in [facility] Cardiac and Pulmonary Rehab  Date 01/12/20      Visit Diagnosis: S/P CABG x 2  S/P aortic valve replacement  Patient's Home Medications on Admission:  Current Outpatient Medications:  .  acetaminophen (TYLENOL) 500 MG tablet, Take 2 tablets (1,000 mg total) by mouth every 6 (six) hours as needed., Disp: 30 tablet, Rfl: 0 .  aspirin EC 81 MG tablet, Take 81 mg by mouth at bedtime. , Disp: , Rfl:  .  CINNAMON PO, Take 200 mcg by mouth daily., Disp: , Rfl:  .  citalopram (CELEXA) 20 MG tablet, Take 20 mg by mouth daily., Disp: , Rfl:  .  clopidogrel (PLAVIX) 75 MG tablet, Take 1 tablet (75 mg total) by mouth daily., Disp: 30 tablet, Rfl: 3 .  furosemide (LASIX) 40 MG tablet, Take 1/2 (one-half) tablet by mouth once daily, Disp: 15 tablet, Rfl: 0 .  Insulin Pen Needle 31G X 8 MM MISC, Use as directed, Disp: 100 each, Rfl: 3 .  LANTUS SOLOSTAR 100 UNIT/ML Solostar Pen, Inject 10-16 Units into the skin daily. Take 20 mg in the morning and 15 mg at bedtime, Disp: , Rfl:  .  losartan (COZAAR) 25 MG tablet, Take 0.5 tablets (12.5 mg total) by mouth daily., Disp: 45 tablet, Rfl: 3 .  lovastatin (MEVACOR) 40 MG tablet, Take 1 tablet (40 mg total) by mouth at bedtime., Disp: 90 tablet, Rfl: 3 .  metFORMIN (GLUCOPHAGE-XR) 500 MG 24 hr tablet, Take 500 mg by mouth at bedtime. , Disp: , Rfl:  .  metoprolol tartrate (LOPRESSOR) 25 MG tablet, Take 0.5 tablets (12.5 mg total) by mouth 2 (two) times daily., Disp: 30 tablet, Rfl: 1 .  Multiple Vitamins-Minerals (MENS MULTIVITAMIN PLUS) TABS, Take by mouth daily., Disp: , Rfl:  .  nitroGLYCERIN (NITROSTAT) 0.4 MG SL  tablet, Place 1 tablet (0.4 mg total) under the tongue every 5 (five) minutes as needed for chest pain., Disp: 25 tablet, Rfl: 3 .  potassium chloride (KLOR-CON) 10 MEQ tablet, Take 1 tablet (10 mEq total) by mouth 3 (three) times a week., Disp: 30 tablet, Rfl: 1 .  sitaGLIPtin (JANUVIA) 100 MG tablet, Take 1 tablet (100 mg total) by mouth daily., Disp: 30 tablet, Rfl: 3  Past Medical History: Past Medical History:  Diagnosis Date  . Arthritis   . Asthma    Childhood  . Cataract   . Coronary artery disease   . Diabetes mellitus without complication (HCC)    Type II  . Heart murmur   . Hyperlipidemia   . Hypertension   . S/P aortic valve replacement with bioprosthetic valve 09/24/2019   Size 23 mm Edwards Inspiris Resilia  . 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 Severe aortic valve stenosis   . Shingles 09/2019    Tobacco Use: Social History   Tobacco Use  Smoking Status Former Smoker  . Packs/day: 1.50  . Years: 25.00  . Pack years: 37.50  . Types: Cigarettes  . Quit date: 03/30/1997  . Years since quitting: 22.9  Smokeless Tobacco Never Used    Labs: Recent Review Advice worker  Labs for ITP Cardiac and Pulmonary Rehab Latest Ref Rng & Units 09/24/2019 09/24/2019 09/24/2019 09/24/2019 09/26/2019   Cholestrol 0 - 200 mg/dL - - - - 78   LDLCALC 0 - 99 mg/dL - - - - 37   HDL >16 mg/dL - - - - 10(R)   Trlycerides <150 mg/dL - - - - 67   Hemoglobin A1c 4.8 - 5.6 % - - - - -   PHART 7.350 - 7.450 - 7.330(L) 7.299(L) 7.280(L) -   PCO2ART 32.0 - 48.0 mmHg - 49.7(H) 51.6(H) 41.1 -   HCO3 20.0 - 28.0 mmol/L - 26.3 25.1 19.1(L) -   TCO2 22 - 32 mmol/L 27 28 27  20(L) -   ACIDBASEDEF 0.0 - 2.0 mmol/L - - 1.0 7.0(H) -   O2SAT % - 96.0 94.0 93.0 -       Exercise Target Goals: Exercise Program Goal: Individual exercise prescription set using results from initial 6 min walk test and THRR while considering  patient's activity barriers and safety.   Exercise  Prescription Goal: Initial exercise prescription builds to 30-45 minutes a day of aerobic activity, 2-3 days per week.  Home exercise guidelines will be given to patient during program as part of exercise prescription that the participant will acknowledge.   Education: Aerobic Exercise: - Group verbal and visual presentation on the components of exercise prescription. Introduces F.I.T.T principle from ACSM for exercise prescriptions.  Reviews F.I.T.T. principles of aerobic exercise including progression. Written material given at graduation.   Education: Resistance Exercise: - Group verbal and visual presentation on the components of exercise prescription. Introduces F.I.T.T principle from ACSM for exercise prescriptions  Reviews F.I.T.T. principles of resistance exercise including progression. Written material given at graduation.    Education: Exercise & Equipment Safety: - Individual verbal instruction and demonstration of equipment use and safety with use of the equipment. Flowsheet Row Cardiac Rehab from 01/28/2020 in [facility] Cardiac and Pulmonary Rehab  Date 01/08/20  Educator [facility]  Instruction Review Code 1- Verbalizes Understanding      Education: Exercise Physiology & General Exercise Guidelines: - Group verbal and written instruction with models to review the exercise physiology of the cardiovascular system and associated critical values. Provides general exercise guidelines with specific guidelines to those with heart or lung disease.  Flowsheet Row Cardiac Rehab from 01/28/2020 in [facility] Cardiac and Pulmonary Rehab  Education need identified 01/12/20      Education: Flexibility, Balance, Mind/Body Relaxation: - Group verbal and visual presentation with interactive activity on the components of exercise prescription. Introduces F.I.T.T principle from ACSM for exercise prescriptions. Reviews F.I.T.T. principles of flexibility and balance exercise training including progression. Also  discusses the mind body connection.  Reviews various relaxation techniques to help reduce and manage stress (i.e. Deep breathing, progressive muscle relaxation, and visualization). Balance handout provided to take home. Written material given at graduation.   Activity Barriers & Risk Stratification:  Activity Barriers & Cardiac Risk Stratification - 01/12/20 1119      Activity Barriers & Cardiac Risk Stratification   Activity Barriers Muscular Weakness;Deconditioning;Shortness of Breath;History of Falls;Other (comment)    Comments increased ectopy with exercise (note sent to cardiologist)    Cardiac Risk Stratification High           6 Minute Walk:  6 Minute Walk    Row Name 01/12/20 1117         6 Minute Walk   Phase Initial     Distance 1079 feet  Walk Time 6 minutes     # of Rest Breaks 0     MPH 2.04     METS 2.75     RPE 12     Perceived Dyspnea  1     VO2 Peak 9.63     Symptoms Yes (comment)     Comments hip pain 5/10, SOB from mask     Resting HR 75 bpm     Resting BP 126/70     Resting Oxygen Saturation  95 %     Exercise Oxygen Saturation  during 6 min walk 96 %     Max Ex. HR 112 bpm     Max Ex. BP 158/64     2 Minute Post BP 112/66            Oxygen Initial Assessment:   Oxygen Re-Evaluation:   Oxygen Discharge (Final Oxygen Re-Evaluation):   Initial Exercise Prescription:  Initial Exercise Prescription - 01/12/20 1100      Date of Initial Exercise RX and Referring Provider   Date 01/12/20    Referring Provider Julien NordmannGollan, Timothy MD      Treadmill   MPH 2    Grade 0.5    Minutes 15    METs 2.67      Recumbant Bike   Level 2    RPM 50    Watts 22    Minutes 15    METs 2      NuStep   Level 2    SPM 80    Minutes 15    METs 2      Arm Ergometer   Level 2    Watts 40    RPM 25    Minutes 15    METs 2      Biostep-RELP   Level 2    SPM 50    Minutes 15    METs 2      Prescription Details   Frequency (times per week)  3    Duration Progress to 30 minutes of continuous aerobic without signs/symptoms of physical distress      Intensity   THRR 40-80% of Max Heartrate 105-136    Ratings of Perceived Exertion 11-13    Perceived Dyspnea 0-4      Progression   Progression Continue to progress workloads to maintain intensity without signs/symptoms of physical distress.      Resistance Training   Training Prescription Yes    Weight 4 lb    Reps 10-15           Perform Capillary Blood Glucose checks as needed.  Exercise Prescription Changes:  Exercise Prescription Changes    Row Name 01/12/20 1100 01/19/20 1300 02/02/20 1200 02/17/20 1600       Response to Exercise   Blood Pressure (Admit) 126/70 104/58 108/60 98/54    Blood Pressure (Exercise) 158/64 126/58 128/78 102/54    Blood Pressure (Exit) 112/66 100/64 96/54 100/58    Heart Rate (Admit) 75 bpm 86 bpm 84 bpm 79 bpm    Heart Rate (Exercise) 112 bpm 99 bpm 103 bpm 120 bpm    Heart Rate (Exit) 81 bpm 82 bpm 91 bpm 96 bpm    Oxygen Saturation (Admit) 95 % -- -- --    Oxygen Saturation (Exercise) 96 % -- -- --    Rating of Perceived Exertion (Exercise) 12 13 13 13     Perceived Dyspnea (Exercise) 1 -- -- --    Symptoms hip pain 5/10  none none none    Comments walk test results second full day of exercise -- --    Duration -- Progress to 30 minutes of  aerobic without signs/symptoms of physical distress Progress to 30 minutes of  aerobic without signs/symptoms of physical distress Continue with 30 min of aerobic exercise without signs/symptoms of physical distress.    Intensity -- THRR unchanged THRR unchanged THRR unchanged         Progression   Progression -- Continue to progress workloads to maintain intensity without signs/symptoms of physical distress. Continue to progress workloads to maintain intensity without signs/symptoms of physical distress. Continue to progress workloads to maintain intensity without signs/symptoms of physical  distress.    Average METs -- 2.65 2.4 2.86         Resistance Training   Training Prescription -- Yes Yes Yes    Weight -- 4 lb 4 lb 4 lb    Reps -- 10-15 10-15 10-15         Interval Training   Interval Training -- No No No         Treadmill   MPH -- 2 -- --    Grade -- 0.5 -- --    Minutes -- 15 -- --    METs -- 2.67 -- --         Recumbant Bike   Level -- -- 4 4    RPM -- -- 50 --    Watts -- -- -- 37    Minutes -- -- 15 15    METs -- -- 2.74 3.33         NuStep   Level -- 2 -- 4    Minutes -- 15 -- 15    METs -- 2.3 -- 3         Arm Ergometer   Level -- -- 3 4    RPM -- -- 25 --    Minutes -- -- 15 15    METs -- -- -- 2.1         Biostep-RELP   Level -- 2 -- 4    Minutes -- 15 -- 15    METs -- 3 -- 3           Exercise Comments:  Exercise Comments    Row Name 01/14/20 1522           Exercise Comments Note sent to MD about increase ectopy during . Return note: Okay to keep exercising, Increasing his metoprolol limited by low blood pressure on last visit If every workout associated with lots of ectopy I can place a Zio monitor to count PVC burden              Exercise Goals and Review:  Exercise Goals    Row Name 01/12/20 1139             Exercise Goals   Increase Physical Activity Yes       Intervention Provide advice, education, support and counseling about physical activity/exercise needs.;Develop an individualized exercise prescription for aerobic and resistive training based on initial evaluation findings, risk stratification, comorbidities and participant's personal goals.       Expected Outcomes Short Term: Attend rehab on a regular basis to increase amount of physical activity.;Long Term: Add in home exercise to make exercise part of routine and to increase amount of physical activity.;Long Term: Exercising regularly at least 3-5 days a week.       Increase Strength and Stamina Yes  Intervention Provide advice, education,  support and counseling about physical activity/exercise needs.;Develop an individualized exercise prescription for aerobic and resistive training based on initial evaluation findings, risk stratification, comorbidities and participant's personal goals.       Expected Outcomes Short Term: Increase workloads from initial exercise prescription for resistance, speed, and METs.;Short Term: Perform resistance training exercises routinely during rehab and add in resistance training at home;Long Term: Improve cardiorespiratory fitness, muscular endurance and strength as measured by increased METs and functional capacity ( )       Able to understand and use rate of perceived exertion (RPE) scale Yes       Intervention Provide education and explanation on how to use RPE scale       Expected Outcomes Short Term: Able to use RPE daily in rehab to express subjective intensity level;Long Term:  Able to use RPE to guide intensity level when exercising independently       Able to understand and use Dyspnea scale Yes       Intervention Provide education and explanation on how to use Dyspnea scale       Expected Outcomes Short Term: Able to use Dyspnea scale daily in rehab to express subjective sense of shortness of breath during exertion;Long Term: Able to use Dyspnea scale to guide intensity level when exercising independently       Knowledge and understanding of Target Heart Rate Range (THRR) Yes       Intervention Provide education and explanation of THRR including how the numbers were predicted and where they are located for reference       Expected Outcomes Short Term: Able to state/look up THRR;Short Term: Able to use daily as guideline for intensity in rehab;Long Term: Able to use THRR to govern intensity when exercising independently       Able to check pulse independently Yes       Intervention Provide education and demonstration on how to check pulse in carotid and radial arteries.;Review the importance of  being able to check your own pulse for safety during independent exercise       Expected Outcomes Short Term: Able to explain why pulse checking is important during independent exercise;Long Term: Able to check pulse independently and accurately       Understanding of Exercise Prescription Yes       Intervention Provide education, explanation, and written materials on patient's individual exercise prescription       Expected Outcomes Short Term: Able to explain program exercise prescription;Long Term: Able to explain home exercise prescription to exercise independently              Exercise Goals Re-Evaluation :  Exercise Goals Re-Evaluation    Row Name 01/16/20 1026 01/19/20 1315 02/02/20 1218 02/17/20 1613       Exercise Goal Re-Evaluation   Exercise Goals Review Increase Physical Activity;Able to understand and use rate of perceived exertion (RPE) scale;Knowledge and understanding of Target Heart Rate Range (THRR);Understanding of Exercise Prescription;Increase Strength and Stamina;Able to check pulse independently Increase Physical Activity;Increase Strength and Stamina;Understanding of Exercise Prescription Increase Physical Activity;Increase Strength and Stamina Increase Physical Activity;Increase Strength and Stamina;Understanding of Exercise Prescription    Comments Reviewed RPE and dyspnea scales, THR and program prescription with pt today.  Pt voiced understanding and was given a copy of goals to take home. Raeden is off to a good start for rehab.  He has completed his first two full days of exercise.  We will continue to montior his  progress. Daymian attends consistently and works in lower end of THR range.  Staff will encourage working in [facility] each time. Angelus fell at home and hurt his shoulder.  We will continue to monitor his progress once able to return.    Expected Outcomes Short: Use RPE daily to regulate intensity. Long: Follow program prescription in THR. Short: Attend rehab  regularly Long: Continue to follow program prescription Short: reach THR range each session Long: continue to build stamina Short: Return to rehab  Long; Continue to build stamina           Discharge Exercise Prescription (Final Exercise Prescription Changes):  Exercise Prescription Changes - 02/17/20 1600      Response to Exercise   Blood Pressure (Admit) 98/54    Blood Pressure (Exercise) 102/54    Blood Pressure (Exit) 100/58    Heart Rate (Admit) 79 bpm    Heart Rate (Exercise) 120 bpm    Heart Rate (Exit) 96 bpm    Rating of Perceived Exertion (Exercise) 13    Symptoms none    Duration Continue with 30 min of aerobic exercise without signs/symptoms of physical distress.    Intensity THRR unchanged      Progression   Progression Continue to progress workloads to maintain intensity without signs/symptoms of physical distress.    Average METs 2.86      Resistance Training   Training Prescription Yes    Weight 4 lb    Reps 10-15      Interval Training   Interval Training No      Recumbant Bike   Level 4    Watts 37    Minutes 15    METs 3.33      NuStep   Level 4    Minutes 15    METs 3      Arm Ergometer   Level 4    Minutes 15    METs 2.1      Biostep-RELP   Level 4    Minutes 15    METs 3           Nutrition:  Target Goals: Understanding of nutrition guidelines, daily intake of sodium 1500mg , cholesterol 200mg , calories 30% from fat and 7% or less from saturated fats, daily to have 5 or more servings of fruits and vegetables.  Education: All About Nutrition: -Group instruction provided by verbal, written material, interactive activities, discussions, models, and posters to present general guidelines for heart healthy nutrition including fat, fiber, MyPlate, the role of sodium in heart healthy nutrition, utilization of the nutrition label, and utilization of this knowledge for meal planning. Follow up email sent as well. Written material given at  graduation. Flowsheet Row Cardiac Rehab from 01/28/2020 in [facility] Cardiac and Pulmonary Rehab  Education need identified 01/12/20      Biometrics:  Pre Biometrics - 01/12/20 1139      Pre Biometrics   Height 5' 8.9" (1.75 m)    Weight 206 lb 4.8 oz (93.6 kg)    BMI (Calculated) 30.56    Single Leg Stand 29.4 seconds            Nutrition Therapy Plan and Nutrition Goals:  Nutrition Therapy & Goals - 01/26/20 1010      Nutrition Therapy   Diet Heart healthy, low Na, diabetes friendly    Protein (specify units) 75g    Fiber 30 grams    Whole Grain Foods 3 servings    Saturated Fats 12 max. grams  Fruits and Vegetables 8 servings/day    Sodium 1.5 grams      Personal Nutrition Goals   Nutrition Goal ST: Add hearty snack or small lunch with protein, vegetables and whole grains such as egg salad sandwich with carrots or tuna salad on whole wheat. Add nuts/seeds or peanut butter to animal cracker snack to improve BG responseLT: He wants to lose 30lbs, A1C at or below 7 (now 8.2).    Comments B: 2 packs of instant grits and boiled egg with banana and sometimes second piece of fruit. S: handful of animal crackers. S2: cheese and wheat crackers D: ex: beef stew over rice, imitation crab salad. S: sugar-free posicles. Drinks at least one 12 oz bottle of mineral water. Drinks: tries to drink water during the day. Fruits/vegetables: green vegetables, bananas, potatoes, mandarines, canned fruit - staying away from now due to sugar. Discussed heart healthy eating as well as diabetes friendly eating.      Intervention Plan   Intervention Nutrition handout(s) given to patient.;Prescribe, educate and counsel regarding individualized specific dietary modifications aiming towards targeted core components such as weight, hypertension, lipid management, diabetes, heart failure and other comorbidities.    Expected Outcomes Short Term Goal: Understand basic principles of dietary content, such as  calories, fat, sodium, cholesterol and nutrients.;Short Term Goal: A plan has been developed with personal nutrition goals set during dietitian appointment.;Long Term Goal: Adherence to prescribed nutrition plan.           Nutrition Assessments:  Nutrition Assessments - 01/12/20 0816      MEDFICTS Scores   Pre Score 23          MEDIFICTS Score Key:  ?70 Need to make dietary changes   40-70 Heart Healthy Diet  ? 40 Therapeutic Level Cholesterol Diet   Picture Your Plate Scores:  <14 Unhealthy dietary pattern with much room for improvement.  41-50 Dietary pattern unlikely to meet recommendations for good health and room for improvement.  51-60 More healthful dietary pattern, with some room for improvement.   >60 Healthy dietary pattern, although there may be some specific behaviors that could be improved.    Nutrition Goals Re-Evaluation:   Nutrition Goals Discharge (Final Nutrition Goals Re-Evaluation):   Psychosocial: Target Goals: Acknowledge presence or absence of significant depression and/or stress, maximize coping skills, provide positive support system. Participant is able to verbalize types and ability to use techniques and skills needed for reducing stress and depression.   Education: Stress, Anxiety, and Depression - Group verbal and visual presentation to define topics covered.  Reviews how body is impacted by stress, anxiety, and depression.  Also discusses healthy ways to reduce stress and to treat/manage anxiety and depression.  Written material given at graduation.   Education: Sleep Hygiene -Provides group verbal and written instruction about how sleep can affect your health.  Define sleep hygiene, discuss sleep cycles and impact of sleep habits. Review good sleep hygiene tips.    Initial Review & Psychosocial Screening:  Initial Psych Review & Screening - 01/08/20 1009      Initial Review   Current issues with None Identified      Family  Dynamics   Good Support System? Yes    Comments He can look to his wife, daughter and grandson for support. Edrees has a positive outlook on his health.      Barriers   Psychosocial barriers to participate in program The patient should benefit from training in stress management and relaxation.;There are no  identifiable barriers or psychosocial needs.      Screening Interventions   Interventions Encouraged to exercise;Provide feedback about the scores to participant;To provide support and resources with identified psychosocial needs    Expected Outcomes Short Term goal: Utilizing psychosocial counselor, staff and physician to assist with identification of specific Stressors or current issues interfering with healing process. Setting desired goal for each stressor or current issue identified.;Long Term Goal: Stressors or current issues are controlled or eliminated.;Short Term goal: Identification and review with participant of any Quality of Life or Depression concerns found by scoring the questionnaire.;Long Term goal: The participant improves quality of Life and PHQ9 Scores as seen by post scores and/or verbalization of changes           Quality of Life Scores:   Quality of Life - 01/12/20 0815      Quality of Life   Select Quality of Life      Quality of Life Scores   Health/Function Pre 28.23 %    Socioeconomic Pre 26.62 %    Psych/Spiritual Pre 29.28 %    Family Pre 30 %    GLOBAL Pre 28.32 %          Scores of 19 and below usually indicate a poorer quality of life in these areas.  A difference of  2-3 points is a clinically meaningful difference.  A difference of 2-3 points in the total score of the Quality of Life Index has been associated with significant improvement in overall quality of life, self-image, physical symptoms, and general health in studies assessing change in quality of life.  PHQ-9: Recent Review Flowsheet Data    Depression screen [facility] 2/9 01/12/2020    Decreased Interest 0   Down, Depressed, Hopeless 0   PHQ - 2 Score 0   Altered sleeping 0   Tired, decreased energy 1   Change in appetite 0   Feeling bad or failure about yourself  0   Trouble concentrating 0   Moving slowly or fidgety/restless 0   Suicidal thoughts 0   PHQ-9 Score 1   Difficult doing work/chores Not difficult at all     Interpretation of Total Score  Total Score Depression Severity:  1-4 = Minimal depression, 5-9 = Mild depression, 10-14 = Moderate depression, 15-19 = Moderately severe depression, 20-27 = Severe depression   Psychosocial Evaluation and Intervention:  Psychosocial Evaluation - 01/08/20 1011      Psychosocial Evaluation & Interventions   Interventions Encouraged to exercise with the program and follow exercise prescription;Relaxation education    Comments He can look to his wife, daughter and grandson for support. Tres has a positive outlook on his health.    Expected Outcomes Short: Exercise regularly to support mental health and notify staff of any changes. Long: maintain mental health and well being through teaching of rehab or prescribed medications independently.    Continue Psychosocial Services  Follow up required by staff           Psychosocial Re-Evaluation:   Psychosocial Discharge (Final Psychosocial Re-Evaluation):   Vocational Rehabilitation: Provide vocational rehab assistance to qualifying candidates.   Vocational Rehab Evaluation & Intervention:   Education: Education Goals: Education classes will be provided on a variety of topics geared toward better understanding of heart health and risk factor modification. Participant will state understanding/return demonstration of topics presented as noted by education test scores.  Learning Barriers/Preferences:  Learning Barriers/Preferences - 01/08/20 1009      Learning Barriers/Preferences  Learning Barriers None    Learning Preferences None           General  Cardiac Education Topics:  AED/CPR: - Group verbal and written instruction with the use of models to demonstrate the basic use of the AED with the basic ABC's of resuscitation.   Anatomy and Cardiac Procedures: - Group verbal and visual presentation and models provide information about basic cardiac anatomy and function. Reviews the testing methods done to diagnose heart disease and the outcomes of the test results. Describes the treatment choices: Medical Management, Angioplasty, or Coronary Bypass Surgery for treating various heart conditions including Myocardial Infarction, Angina, Valve Disease, and Cardiac Arrhythmias.  Written material given at graduation. Flowsheet Row Cardiac Rehab from 01/28/2020 in [facility] Cardiac and Pulmonary Rehab  Education need identified 01/12/20      Medication Safety: - Group verbal and visual instruction to review commonly prescribed medications for heart and lung disease. Reviews the medication, class of the drug, and side effects. Includes the steps to properly store meds and maintain the prescription regimen.  Written material given at graduation.   Intimacy: - Group verbal instruction through game format to discuss how heart and lung disease can affect sexual intimacy. Written material given at graduation..   Know Your Numbers and Heart Failure: - Group verbal and visual instruction to discuss disease risk factors for cardiac and pulmonary disease and treatment options.  Reviews associated critical values for Overweight/Obesity, Hypertension, Cholesterol, and Diabetes.  Discusses basics of heart failure: signs/symptoms and treatments.  Introduces Heart Failure Zone chart for action plan for heart failure.  Written material given at graduation.   Infection Prevention: - Provides verbal and written material to individual with discussion of infection control including proper hand washing and proper equipment cleaning during exercise session. Flowsheet Row  Cardiac Rehab from 01/28/2020 in [facility] Cardiac and Pulmonary Rehab  Date 01/08/20  Educator [facility]  Instruction Review Code 1- Verbalizes Understanding      Falls Prevention: - Provides verbal and written material to individual with discussion of falls prevention and safety. Flowsheet Row Cardiac Rehab from 01/28/2020 in [facility] Cardiac and Pulmonary Rehab  Date 01/08/20  Educator [facility]  Instruction Review Code 1- Verbalizes Understanding      Other: -Provides group and verbal instruction on various topics (see comments)   Knowledge Questionnaire Score:  Knowledge Questionnaire Score - 01/12/20 0816      Knowledge Questionnaire Score   Pre Score 23/26 Education Focus: Exercise, Nutrition, Angina           Core Components/Risk Factors/Patient Goals at Admission:  Personal Goals and Risk Factors at Admission - 01/12/20 1140      Core Components/Risk Factors/Patient Goals on Admission    Weight Management Yes;Weight Loss;Obesity    Intervention Weight Management: Provide education and appropriate resources to help participant work on and attain dietary goals.;Weight Management: Develop a combined nutrition and exercise program designed to reach desired caloric intake, while maintaining appropriate intake of nutrient and fiber, sodium and fats, and appropriate energy expenditure required for the weight goal.;Weight Management/Obesity: Establish reasonable short term and long term weight goals.;Obesity: Provide education and appropriate resources to help participant work on and attain dietary goals.    Admit Weight 206 lb 4.8 oz (93.6 kg)    Goal Weight: Short Term 200 lb (90.7 kg)    Goal Weight: Long Term 180 lb (81.6 kg)    Expected Outcomes Short Term: Continue to assess and modify interventions until short term weight is  achieved;Long Term: Adherence to nutrition and physical activity/exercise program aimed toward attainment of established weight goal;Weight Loss: Understanding of general  recommendations for a balanced deficit meal plan, which promotes 1-2 lb weight loss per week and includes a negative energy balance of [phone] kcal/d;Understanding of distribution of calorie intake throughout the day with the consumption of 4-5 meals/snacks;Understanding recommendations for meals to include 15-35% energy as protein, 25-35% energy from fat, 35-60% energy from carbohydrates, less than 200mg  of dietary cholesterol, 20-35 gm of total fiber daily    Diabetes Yes    Intervention Provide education about signs/symptoms and action to take for hypo/hyperglycemia.;Provide education about proper nutrition, including hydration, and aerobic/resistive exercise prescription along with prescribed medications to achieve blood glucose in normal ranges: Fasting glucose 65-99 mg/dL    Expected Outcomes Short Term: Participant verbalizes understanding of the signs/symptoms and immediate care of hyper/hypoglycemia, proper foot care and importance of medication, aerobic/resistive exercise and nutrition plan for blood glucose control.;Long Term: Attainment of HbA1C < 7%.    Hypertension Yes    Intervention Provide education on lifestyle modifcations including regular physical activity/exercise, weight management, moderate sodium restriction and increased consumption of fresh fruit, vegetables, and low fat dairy, alcohol moderation, and smoking cessation.;Monitor prescription use compliance.    Expected Outcomes Short Term: Continued assessment and intervention until BP is < 140/20mm HG in hypertensive participants. < 130/59mm HG in hypertensive participants with diabetes, heart failure or chronic kidney disease.;Long Term: Maintenance of blood pressure at goal levels.    Lipids Yes    Intervention Provide education and support for participant on nutrition & aerobic/resistive exercise along with prescribed medications to achieve LDL 70mg , HDL >40mg .    Expected Outcomes Short Term: Participant states understanding  of desired cholesterol values and is compliant with medications prescribed. Participant is following exercise prescription and nutrition guidelines.;Long Term: Cholesterol controlled with medications as prescribed, with individualized exercise RX and with personalized nutrition plan. Value goals: LDL < 70mg , HDL > 40 mg.           Education:Diabetes - Individual verbal and written instruction to review signs/symptoms of diabetes, desired ranges of glucose level fasting, after meals and with exercise. Acknowledge that pre and post exercise glucose checks will be done for 3 sessions at entry of program. Flowsheet Row Cardiac Rehab from 01/28/2020 in [facility] Cardiac and Pulmonary Rehab  Date 01/08/20  Educator [facility]  Instruction Review Code 1- Verbalizes Understanding      Core Components/Risk Factors/Patient Goals Review:    Core Components/Risk Factors/Patient Goals at Discharge (Final Review):    ITP Comments:  ITP Comments    Row Name 01/08/20 1013 01/12/20 1117 01/14/20 1522 01/16/20 1025 02/04/20 1034   ITP Comments Virtual Visit completed. Patient informed on EP and RD appointment and 6 Minute walk test. Patient also informed of patient health questionnaires on My Chart. Patient Verbalizes understanding. Visit diagnosis can be found in [facility] 09/24/2019. Completed and gym orientation. Initial ITP created and sent for review to Dr. Bethann Punches, Medical Director. Note sent to MD about increase ectopy during .  Return note: Okay to keep exercising, Increasing his metoprolol limited by low blood pressure on last visit If every workout associated with lots of ectopy I can place a Zio monitor to count PVC burden First full day of exercise!  Patient was oriented to gym and equipment including functions, settings, policies, and procedures.  Patient's individual exercise prescription and treatment plan were reviewed.  All starting workloads were established based on  the results of the 6 minute walk  test done at initial orientation visit.  The plan for exercise progression was also introduced and progression will be customized based on patient's performance and goals. 30 Day review completed. Medical Director ITP review done, changes made as directed, and signed approval by Medical Director. New to program   Row Name 02/17/20 1613 02/18/20 0946 02/18/20 1356       ITP Comments Haynes called to let us know that he feel and hurt his shoulder.  He will be out for at least the next week?? Anthoney Sheppard to follow up on fall with shoulder, left message.  He will need clearance to exercise. Returned call.  He will need to discharge at this time as his recovery will be at least 6-8 weeks for his shoulder.            Comments: Discharge ITP from fall

## 2020-02-18 NOTE — Telephone Encounter (Signed)
Called [facility] to follow up on fall with shoulder, left message.  He will need clearance to exercise.

## 2020-02-18 NOTE — Progress Notes (Signed)
Discharge Progress Report  Patient Details  Name: Chase Saunders MRN: [id number] Date of Birth: 12-25-1950 Referring Provider:   Flowsheet Row Cardiac Rehab from 01/12/2020 in [facility] Cardiac and Pulmonary Rehab  Referring Provider Julien Nordmann MD       Number of Visits: 10  Reason for Discharge:  Early Exit:  Shoulder injury from fall  Smoking History:  Social History   Tobacco Use  Smoking Status Former Smoker  . Packs/day: 1.50  . Years: 25.00  . Pack years: 37.50  . Types: Cigarettes  . Quit date: 03/30/1997  . Years since quitting: 22.9  Smokeless Tobacco Never Used    Diagnosis:  S/P CABG x 2  S/P aortic valve replacement  ADL UCSD:   Initial Exercise Prescription:  Initial Exercise Prescription - 01/12/20 1100      Date of Initial Exercise RX and Referring Provider   Date 01/12/20    Referring Provider Julien Nordmann MD      Treadmill   MPH 2    Grade 0.5    Minutes 15    METs 2.67      Recumbant Bike   Level 2    RPM 50    Watts 22    Minutes 15    METs 2      NuStep   Level 2    SPM 80    Minutes 15    METs 2      Arm Ergometer   Level 2    Watts 40    RPM 25    Minutes 15    METs 2      Biostep-RELP   Level 2    SPM 50    Minutes 15    METs 2      Prescription Details   Frequency (times per week) 3    Duration Progress to 30 minutes of continuous aerobic without signs/symptoms of physical distress      Intensity   THRR 40-80% of Max Heartrate 105-136    Ratings of Perceived Exertion 11-13    Perceived Dyspnea 0-4      Progression   Progression Continue to progress workloads to maintain intensity without signs/symptoms of physical distress.      Resistance Training   Training Prescription Yes    Weight 4 lb    Reps 10-15           Discharge Exercise Prescription (Final Exercise Prescription Changes):  Exercise Prescription Changes - 02/17/20 1600      Response to Exercise   Blood Pressure (Admit) 98/54     Blood Pressure (Exercise) 102/54    Blood Pressure (Exit) 100/58    Heart Rate (Admit) 79 bpm    Heart Rate (Exercise) 120 bpm    Heart Rate (Exit) 96 bpm    Rating of Perceived Exertion (Exercise) 13    Symptoms none    Duration Continue with 30 min of aerobic exercise without signs/symptoms of physical distress.    Intensity THRR unchanged      Progression   Progression Continue to progress workloads to maintain intensity without signs/symptoms of physical distress.    Average METs 2.86      Resistance Training   Training Prescription Yes    Weight 4 lb    Reps 10-15      Interval Training   Interval Training No      Recumbant Bike   Level 4    Watts 37    Minutes 15  METs 3.33      NuStep   Level 4    Minutes 15    METs 3      Arm Ergometer   Level 4    Minutes 15    METs 2.1      Biostep-RELP   Level 4    Minutes 15    METs 3           Functional Capacity:  6 Minute Walk    Row Name 01/12/20 1117         6 Minute Walk   Phase Initial     Distance 1079 feet     Walk Time 6 minutes     # of Rest Breaks 0     MPH 2.04     METS 2.75     RPE 12     Perceived Dyspnea  1     VO2 Peak 9.63     Symptoms Yes (comment)     Comments hip pain 5/10, SOB from mask     Resting HR 75 bpm     Resting BP 126/70     Resting Oxygen Saturation  95 %     Exercise Oxygen Saturation  during 6 min walk 96 %     Max Ex. HR 112 bpm     Max Ex. BP 158/64     2 Minute Post BP 112/66            Psychological, QOL, Others - Outcomes: PHQ 2/9: Depression screen PHQ 2/9 01/12/2020  Decreased Interest 0  Down, Depressed, Hopeless 0  PHQ - 2 Score 0  Altered sleeping 0  Tired, decreased energy 1  Change in appetite 0  Feeling bad or failure about yourself  0  Trouble concentrating 0  Moving slowly or fidgety/restless 0  Suicidal thoughts 0  PHQ-9 Score 1  Difficult doing work/chores Not difficult at all    Quality of Life:  Quality of Life - 01/12/20  0815      Quality of Life   Select Quality of Life      Quality of Life Scores   Health/Function Pre 28.23 %    Socioeconomic Pre 26.62 %    Psych/Spiritual Pre 29.28 %    Family Pre 30 %    GLOBAL Pre 28.32 %

## 2020-02-19 ENCOUNTER — Encounter: Payer: Self-pay | Admitting: Family

## 2020-02-23 ENCOUNTER — Other Ambulatory Visit: Payer: Self-pay | Admitting: Family

## 2020-02-23 DIAGNOSIS — S42211A Unspecified displaced fracture of surgical neck of right humerus, initial encounter for closed fracture: Secondary | ICD-10-CM | POA: Diagnosis not present

## 2020-02-23 DIAGNOSIS — R2231 Localized swelling, mass and lump, right upper limb: Secondary | ICD-10-CM | POA: Diagnosis not present

## 2020-02-24 ENCOUNTER — Telehealth: Payer: Self-pay

## 2020-02-24 NOTE — Telephone Encounter (Signed)
-----   Message from Antonieta Iba, MD sent at 02/24/2020  9:42 AM EST -----   Can we place a diagnosis  with the order for cardiac rehab? CAD,stable angina, s/p CABG, AVR Thx TG

## 2020-02-26 DIAGNOSIS — S42211D Unspecified displaced fracture of surgical neck of right humerus, subsequent encounter for fracture with routine healing: Secondary | ICD-10-CM | POA: Diagnosis not present

## 2020-03-11 DIAGNOSIS — S42211A Unspecified displaced fracture of surgical neck of right humerus, initial encounter for closed fracture: Secondary | ICD-10-CM | POA: Diagnosis not present

## 2020-03-11 DIAGNOSIS — S42211D Unspecified displaced fracture of surgical neck of right humerus, subsequent encounter for fracture with routine healing: Secondary | ICD-10-CM | POA: Diagnosis not present

## 2020-03-17 DIAGNOSIS — S42211A Unspecified displaced fracture of surgical neck of right humerus, initial encounter for closed fracture: Secondary | ICD-10-CM | POA: Diagnosis not present

## 2020-03-29 ENCOUNTER — Other Ambulatory Visit: Payer: Self-pay | Admitting: Physician Assistant

## 2020-03-31 DIAGNOSIS — S42211A Unspecified displaced fracture of surgical neck of right humerus, initial encounter for closed fracture: Secondary | ICD-10-CM | POA: Diagnosis not present

## 2020-04-01 DIAGNOSIS — M25612 Stiffness of left shoulder, not elsewhere classified: Secondary | ICD-10-CM | POA: Diagnosis not present

## 2020-04-01 DIAGNOSIS — M25511 Pain in right shoulder: Secondary | ICD-10-CM | POA: Diagnosis not present

## 2020-04-05 ENCOUNTER — Other Ambulatory Visit: Payer: Self-pay | Admitting: Physician Assistant

## 2020-04-12 DIAGNOSIS — H52213 Irregular astigmatism, bilateral: Secondary | ICD-10-CM | POA: Diagnosis not present

## 2020-04-13 DIAGNOSIS — S46011A Strain of muscle(s) and tendon(s) of the rotator cuff of right shoulder, initial encounter: Secondary | ICD-10-CM | POA: Diagnosis not present

## 2020-04-19 DIAGNOSIS — M25612 Stiffness of left shoulder, not elsewhere classified: Secondary | ICD-10-CM | POA: Diagnosis not present

## 2020-04-19 DIAGNOSIS — M25511 Pain in right shoulder: Secondary | ICD-10-CM | POA: Diagnosis not present

## 2020-04-20 DIAGNOSIS — S46011A Strain of muscle(s) and tendon(s) of the rotator cuff of right shoulder, initial encounter: Secondary | ICD-10-CM | POA: Diagnosis not present

## 2020-04-22 DIAGNOSIS — I700 Atherosclerosis of aorta: Secondary | ICD-10-CM | POA: Diagnosis not present

## 2020-04-22 DIAGNOSIS — Z1159 Encounter for screening for other viral diseases: Secondary | ICD-10-CM | POA: Diagnosis not present

## 2020-04-22 DIAGNOSIS — I10 Essential (primary) hypertension: Secondary | ICD-10-CM | POA: Diagnosis not present

## 2020-04-22 DIAGNOSIS — Z125 Encounter for screening for malignant neoplasm of prostate: Secondary | ICD-10-CM | POA: Diagnosis not present

## 2020-04-22 DIAGNOSIS — S46011A Strain of muscle(s) and tendon(s) of the rotator cuff of right shoulder, initial encounter: Secondary | ICD-10-CM | POA: Diagnosis not present

## 2020-04-22 DIAGNOSIS — E1169 Type 2 diabetes mellitus with other specified complication: Secondary | ICD-10-CM | POA: Diagnosis not present

## 2020-04-22 DIAGNOSIS — E7800 Pure hypercholesterolemia, unspecified: Secondary | ICD-10-CM | POA: Diagnosis not present

## 2020-04-22 DIAGNOSIS — Z0000 Encounter for general adult medical examination without abnormal findings: Secondary | ICD-10-CM | POA: Diagnosis not present

## 2020-04-22 DIAGNOSIS — F411 Generalized anxiety disorder: Secondary | ICD-10-CM | POA: Diagnosis not present

## 2020-04-26 DIAGNOSIS — M25612 Stiffness of left shoulder, not elsewhere classified: Secondary | ICD-10-CM | POA: Diagnosis not present

## 2020-04-26 DIAGNOSIS — M25512 Pain in left shoulder: Secondary | ICD-10-CM | POA: Diagnosis not present

## 2020-04-28 DIAGNOSIS — M25512 Pain in left shoulder: Secondary | ICD-10-CM | POA: Diagnosis not present

## 2020-04-28 DIAGNOSIS — M25612 Stiffness of left shoulder, not elsewhere classified: Secondary | ICD-10-CM | POA: Diagnosis not present

## 2020-05-03 ENCOUNTER — Other Ambulatory Visit: Payer: Self-pay

## 2020-05-03 ENCOUNTER — Ambulatory Visit
Admission: EM | Admit: 2020-05-03 | Discharge: 2020-05-03 | Disposition: A | Discharge status: Home / Self Care | Payer: Medicare HMO

## 2020-05-03 ENCOUNTER — Encounter: Payer: Self-pay | Admitting: Family Medicine

## 2020-05-03 DIAGNOSIS — Z20822 Contact with and (suspected) exposure to covid-19: Secondary | ICD-10-CM

## 2020-05-03 MED ORDER — AMOXICILLIN-POT CLAVULANATE 875-125 MG PO TABS: 1.0000 | ORAL_TABLET | Freq: Two times a day (BID) | ORAL | 0 refills | Status: DC

## 2020-05-03 NOTE — ED Triage Notes (Signed)
Patient in office today c/o nasal and head congestion with cough sx 7days ago.  Denies:fever, N/V  OTC: Mucinex, Cepacol

## 2020-05-03 NOTE — ED Notes (Signed)
Pt denies SOB, CP. C/o significant fatigue the past several days.

## 2020-05-03 NOTE — Discharge Instructions (Addendum)
Treating you for possible sinus infection  Take the antibiotics as prescribed You can take OTC medicines as needed for symptoms Follow up as needed for continued or worsening symptoms

## 2020-05-04 NOTE — ED Provider Notes (Signed)
Chase Saunders    CSN: [id number] Arrival date & time: 05/03/20  0945      History   Chief Complaint Chief Complaint  Patient presents with  . Nasal Congestion  . Cough    HPI Chase Saunders is a 70 y.o. male.   Pt is a 70 year old male that presents with nasal congestion, sinus pressure, cough x 7 days. Reporting thick dark brown mucous. No fever at home but 100.1 here today. Has been using mucinex and cepacol with no relief. No chest pain, SOB.      Past Medical History:  Diagnosis Date  . Arthritis   . Asthma    Childhood  . Cataract   . Coronary artery disease   . Diabetes mellitus without complication (HCC)    Type II  . Heart murmur   . Hyperlipidemia   . Hypertension   . S/P aortic valve replacement with bioprosthetic valve 09/24/2019   Size 23 mm Edwards Inspiris Resilia  . 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 Severe aortic valve stenosis   . Shingles 09/2019    Patient Active Problem List   Diagnosis Date Noted  . S/P CABG x 2 09/24/2019  . S/P aortic valve replacement with bioprosthetic valve 09/24/2019  . Coronary artery disease   . Unstable angina (HCC) 09/19/2019  . Severe aortic valve stenosis   . Encounter to establish care 08/21/2012  . Dizziness 08/21/2012  . Type 2 diabetes mellitus with hyperglycemia, with long-term current use of insulin (HCC) 08/21/2012  . Mixed hyperlipidemia 08/21/2012  . Essential hypertension 08/21/2012  . Morbid obesity (HCC) 08/21/2012    Past Surgical History:  Procedure Laterality Date  . AORTIC VALVE REPLACEMENT N/A 09/24/2019   Procedure: AORTIC VALVE REPLACEMENT (AVR) using Cyndee Brightly Resilia 23 MM Aortic Valve;  Surgeon: Purcell Nails, MD;  Location: [facility] OR;  Service: Open Heart Surgery;  Laterality: N/A;  . CARDIAC CATHETERIZATION    . COLONOSCOPY    . CORONARY ARTERY BYPASS GRAFT N/A 09/24/2019   Procedure: CORONARY ARTERY BYPASS GRAFTING (CABG) using LIMA to LAD;  Endoscopic harvested right greater saphenous vein: SVG to OM2;  Surgeon: Purcell Nails, MD;  Location: [facility] OR;  Service: Open Heart Surgery;  Laterality: N/A;  . ENDOVEIN HARVEST OF GREATER SAPHENOUS VEIN Right 09/24/2019   Procedure: ENDOVEIN HARVEST OF GREATER SAPHENOUS VEIN;  Surgeon: Purcell Nails, MD;  Location: [facility] OR;  Service: Open Heart Surgery;  Laterality: Right;  . RIGHT/LEFT HEART CATH AND CORONARY ANGIOGRAPHY Bilateral 09/19/2019   Procedure: RIGHT/LEFT HEART CATH AND CORONARY ANGIOGRAPHY;  Surgeon: Antonieta Iba, MD;  Location: [facility] INVASIVE CV LAB;  Service: Cardiovascular;  Laterality: Bilateral;  . TEE WITHOUT CARDIOVERSION N/A 09/24/2019   Procedure: TRANSESOPHAGEAL ECHOCARDIOGRAM (TEE);  Surgeon: Purcell Nails, MD;  Location: [facility] OR;  Service: Open Heart Surgery;  Laterality: N/A;  . TONSILLECTOMY AND ADENOIDECTOMY  1969  . WISDOM TOOTH EXTRACTION         Home Medications    Prior to Admission medications   Medication Sig Start Date End Date Taking? Authorizing Provider  amoxicillin (AMOXIL) 500 MG tablet Take 500 mg by mouth. 4 tabs 1hr before dental work   Yes [provider]  amoxicillin-clavulanate (AUGMENTIN) 875-125 MG tablet Take 1 tablet by mouth every 12 (twelve) hours. 05/03/20  Yes Kelen Laura A, NP  aspirin EC 81 MG tablet Take 81 mg by mouth at  bedtime.     [provider]  CINNAMON PO Take 200 mcg by mouth daily.    [provider]  citalopram (CELEXA) 20 MG tablet Take 20 mg by mouth daily.    [provider]  Insulin Pen Needle 31G X 8 MM MISC Use as directed 06/03/13   Rey, Raquel M, NP  LANTUS SOLOSTAR 100 UNIT/ML Solostar Pen Inject 10-16 Units into the skin daily. Take 20 mg in the morning and 15 mg at bedtime 09/05/19   [provider]  losartan (COZAAR) 25 MG tablet Take 0.5 tablets (12.5 mg total) by mouth daily. 01/02/20   Antonieta Iba, MD  lovastatin (MEVACOR) 40 MG tablet Take 1 tablet (40 mg  total) by mouth at bedtime. 10/29/13   Rey, Richarda Overlie, NP  metFORMIN (GLUCOPHAGE-XR) 500 MG 24 hr tablet Take 500 mg by mouth at bedtime.  05/08/13   Rey, Richarda Overlie, NP  metoprolol tartrate (LOPRESSOR) 25 MG tablet Take 0.5 tablets (12.5 mg total) by mouth 2 (two) times daily. 10/06/19   [name], PA-C  Multiple Vitamins-Minerals (MENS MULTIVITAMIN PLUS) TABS Take by mouth daily.    [provider]  nitroGLYCERIN (NITROSTAT) 0.4 MG SL tablet Place 1 tablet (0.4 mg total) under the tongue every 5 (five) minutes as needed for chest pain. 09/19/19   Antonieta Iba, MD  potassium chloride (KLOR-CON) 10 MEQ tablet Take 1 tablet (10 mEq total) by mouth 3 (three) times a week. 10/31/19   Alver Sorrow, NP  furosemide (LASIX) 40 MG tablet Take 1/2 (one-half) tablet by mouth once daily 02/23/20 05/03/20  Alver Sorrow, NP  sitaGLIPtin (JANUVIA) 100 MG tablet Take 1 tablet (100 mg total) by mouth daily. 10/01/19 05/03/20  Barrett, Rae Roam, PA-C    Family History Family History  Problem Relation Age of Onset  . Alcohol abuse Mother   . Hypertension Mother   . Cancer Father 14       Lung Cancer - smoker  . Heart disease Father        Valve Replacement  . Diabetes Paternal Grandmother     Social History Social History   Tobacco Use  . Smoking status: Former Smoker    Packs/day: 1.50    Years: 25.00    Pack years: 37.50    Types: Cigarettes    Quit date: 03/30/1997    Years since quitting: 23.1  . Smokeless tobacco: Never Used  Vaping Use  . Vaping Use: Never used  Substance Use Topics  . Alcohol use: Never  . Drug use: No     Allergies   Patient has no known allergies.   Review of Systems Review of Systems   Physical Exam Triage Vital Signs ED Triage Vitals  Enc Vitals Group     BP 05/03/20 1010 129/75     Pulse Rate 05/03/20 1010 99     Resp 05/03/20 1010 20     Temp 05/03/20 1010 100.1 F (37.8 C)     Temp Source 05/03/20 1010 Oral     SpO2 05/03/20  1010 95 %     Weight 05/03/20 1000 200 lb (90.7 kg)     Height 05/03/20 1000 5\' 9"  (1.753 m)     Head Circumference --      Peak Flow --      Pain Score 05/03/20 0959 8     Pain Loc --      Pain Edu? --  Excl. in GC? --    No data found.  Updated Vital Signs BP 129/75 (BP Location: Left Arm)   Pulse 99   Temp 100.1 F (37.8 C) (Oral)   Resp 20   Ht 5\' 9"  (1.753 m)   Wt 200 lb (90.7 kg)   SpO2 95%   BMI 29.53 kg/m   Visual Acuity Right Eye Distance:   Left Eye Distance:   Bilateral Distance:    Right Eye Near:   Left Eye Near:    Bilateral Near:     Physical Exam Vitals and nursing note reviewed.  Constitutional:      General: He is not in acute distress.    Appearance: Normal appearance. He is not ill-appearing, toxic-appearing or diaphoretic.  HENT:     Head: Normocephalic and atraumatic.     Right Ear: Tympanic membrane and ear canal normal.     Left Ear: Tympanic membrane and ear canal normal.     Nose: Congestion present.     Mouth/Throat:     Pharynx: Oropharynx is clear.  Eyes:     Conjunctiva/sclera: Conjunctivae normal.  Cardiovascular:     Rate and Rhythm: Normal rate and regular rhythm.  Pulmonary:     Effort: Pulmonary effort is normal.     Breath sounds: Normal breath sounds.  Musculoskeletal:        General: Normal range of motion.     Cervical back: Normal range of motion.  Skin:    General: Skin is warm and dry.  Neurological:     Mental Status: He is alert.  Psychiatric:        Mood and Affect: Mood normal.      UC Treatments / Results  Labs (all labs ordered are listed, but only abnormal results are displayed) Labs Reviewed  NOVEL CORONAVIRUS, NAA    EKG   Radiology No results found.  Procedures Procedures (including critical care time)  Medications Ordered in UC Medications - No data to display  Initial Impression / Assessment and Plan / UC Course  I have reviewed the triage vital signs and the nursing  notes.  Pertinent labs & imaging results that were available during my care of the patient were reviewed by me and considered in my medical decision making (see chart for details).     Sinusitis Covering for bacterial sinusitis based on symptoms and length of symptoms.  OTC meds as needed   Final Clinical Impressions(s) / UC Diagnoses   Final diagnoses:  Encounter for laboratory testing for COVID-19 virus     Discharge Instructions     Treating you for possible sinus infection  Take the antibiotics as prescribed You can take OTC medicines as needed for symptoms Follow up as needed for continued or worsening symptoms     ED Prescriptions    Medication Sig Dispense Auth. Provider   amoxicillin-clavulanate (AUGMENTIN) 875-125 MG tablet Take 1 tablet by mouth every 12 (twelve) hours. 14 tablet Delon Revelo A, NP     PDMP not reviewed this encounter.   , NP 05/04/20 [phone]

## 2020-05-05 LAB — NOVEL CORONAVIRUS, NAA: SARS-CoV-2, NAA: DETECTED — AB

## 2020-05-05 LAB — SARS-COV-2, NAA 2 DAY TAT

## 2020-05-06 ENCOUNTER — Telehealth: Payer: Self-pay

## 2020-05-06 NOTE — Telephone Encounter (Signed)
Called to discuss with patient about COVID-19 symptoms and the use of one of the available treatments for those with mild to moderate Covid symptoms and at a high risk of hospitalization.  Pt appears to qualify for outpatient treatment due to co-morbid conditions and/or a member of an at-risk group in accordance with the FDA Emergency Use Authorization.    Symptom onset: 04/26/20 ? Cough, runny nose, fever Vaccinated: Unknown Booster? Unknown Immunocompromised? No Qualifiers: Asthma, CAD, DM, HTN  Unable to reach pt - Left message and call back number [phone].  Esther Hardy

## 2020-05-20 DIAGNOSIS — S43431A Superior glenoid labrum lesion of right shoulder, initial encounter: Secondary | ICD-10-CM | POA: Diagnosis not present

## 2020-05-26 DIAGNOSIS — M9903 Segmental and somatic dysfunction of lumbar region: Secondary | ICD-10-CM | POA: Diagnosis not present

## 2020-05-26 DIAGNOSIS — M4306 Spondylolysis, lumbar region: Secondary | ICD-10-CM | POA: Diagnosis not present

## 2020-05-26 DIAGNOSIS — M25612 Stiffness of left shoulder, not elsewhere classified: Secondary | ICD-10-CM | POA: Diagnosis not present

## 2020-05-26 DIAGNOSIS — M5431 Sciatica, right side: Secondary | ICD-10-CM | POA: Diagnosis not present

## 2020-05-26 DIAGNOSIS — M9905 Segmental and somatic dysfunction of pelvic region: Secondary | ICD-10-CM | POA: Diagnosis not present

## 2020-05-26 DIAGNOSIS — M25511 Pain in right shoulder: Secondary | ICD-10-CM | POA: Diagnosis not present

## 2020-05-28 DIAGNOSIS — M4306 Spondylolysis, lumbar region: Secondary | ICD-10-CM | POA: Diagnosis not present

## 2020-05-28 DIAGNOSIS — M5431 Sciatica, right side: Secondary | ICD-10-CM | POA: Diagnosis not present

## 2020-05-28 DIAGNOSIS — M9905 Segmental and somatic dysfunction of pelvic region: Secondary | ICD-10-CM | POA: Diagnosis not present

## 2020-05-28 DIAGNOSIS — M9903 Segmental and somatic dysfunction of lumbar region: Secondary | ICD-10-CM | POA: Diagnosis not present

## 2020-05-31 DIAGNOSIS — M5431 Sciatica, right side: Secondary | ICD-10-CM | POA: Diagnosis not present

## 2020-05-31 DIAGNOSIS — M9903 Segmental and somatic dysfunction of lumbar region: Secondary | ICD-10-CM | POA: Diagnosis not present

## 2020-05-31 DIAGNOSIS — M9905 Segmental and somatic dysfunction of pelvic region: Secondary | ICD-10-CM | POA: Diagnosis not present

## 2020-05-31 DIAGNOSIS — M4306 Spondylolysis, lumbar region: Secondary | ICD-10-CM | POA: Diagnosis not present

## 2020-06-02 DIAGNOSIS — M4306 Spondylolysis, lumbar region: Secondary | ICD-10-CM | POA: Diagnosis not present

## 2020-06-02 DIAGNOSIS — M9905 Segmental and somatic dysfunction of pelvic region: Secondary | ICD-10-CM | POA: Diagnosis not present

## 2020-06-02 DIAGNOSIS — M9903 Segmental and somatic dysfunction of lumbar region: Secondary | ICD-10-CM | POA: Diagnosis not present

## 2020-06-02 DIAGNOSIS — M5431 Sciatica, right side: Secondary | ICD-10-CM | POA: Diagnosis not present

## 2020-06-04 DIAGNOSIS — M9905 Segmental and somatic dysfunction of pelvic region: Secondary | ICD-10-CM | POA: Diagnosis not present

## 2020-06-04 DIAGNOSIS — M5431 Sciatica, right side: Secondary | ICD-10-CM | POA: Diagnosis not present

## 2020-06-04 DIAGNOSIS — M4306 Spondylolysis, lumbar region: Secondary | ICD-10-CM | POA: Diagnosis not present

## 2020-06-04 DIAGNOSIS — M9903 Segmental and somatic dysfunction of lumbar region: Secondary | ICD-10-CM | POA: Diagnosis not present

## 2020-06-07 ENCOUNTER — Other Ambulatory Visit: Payer: Self-pay | Admitting: Physician Assistant

## 2020-06-07 DIAGNOSIS — M9905 Segmental and somatic dysfunction of pelvic region: Secondary | ICD-10-CM | POA: Diagnosis not present

## 2020-06-07 DIAGNOSIS — M9903 Segmental and somatic dysfunction of lumbar region: Secondary | ICD-10-CM | POA: Diagnosis not present

## 2020-06-07 DIAGNOSIS — M4306 Spondylolysis, lumbar region: Secondary | ICD-10-CM | POA: Diagnosis not present

## 2020-06-07 DIAGNOSIS — M5431 Sciatica, right side: Secondary | ICD-10-CM | POA: Diagnosis not present

## 2020-06-09 DIAGNOSIS — M25612 Stiffness of left shoulder, not elsewhere classified: Secondary | ICD-10-CM | POA: Diagnosis not present

## 2020-06-09 DIAGNOSIS — M25511 Pain in right shoulder: Secondary | ICD-10-CM | POA: Diagnosis not present

## 2020-06-09 DIAGNOSIS — M9905 Segmental and somatic dysfunction of pelvic region: Secondary | ICD-10-CM | POA: Diagnosis not present

## 2020-06-09 DIAGNOSIS — M5431 Sciatica, right side: Secondary | ICD-10-CM | POA: Diagnosis not present

## 2020-06-09 DIAGNOSIS — M4306 Spondylolysis, lumbar region: Secondary | ICD-10-CM | POA: Diagnosis not present

## 2020-06-09 DIAGNOSIS — M9903 Segmental and somatic dysfunction of lumbar region: Secondary | ICD-10-CM | POA: Diagnosis not present

## 2020-06-11 DIAGNOSIS — M25612 Stiffness of left shoulder, not elsewhere classified: Secondary | ICD-10-CM | POA: Diagnosis not present

## 2020-06-11 DIAGNOSIS — M4306 Spondylolysis, lumbar region: Secondary | ICD-10-CM | POA: Diagnosis not present

## 2020-06-11 DIAGNOSIS — M25511 Pain in right shoulder: Secondary | ICD-10-CM | POA: Diagnosis not present

## 2020-06-11 DIAGNOSIS — M5431 Sciatica, right side: Secondary | ICD-10-CM | POA: Diagnosis not present

## 2020-06-11 DIAGNOSIS — M9903 Segmental and somatic dysfunction of lumbar region: Secondary | ICD-10-CM | POA: Diagnosis not present

## 2020-06-11 DIAGNOSIS — M9905 Segmental and somatic dysfunction of pelvic region: Secondary | ICD-10-CM | POA: Diagnosis not present

## 2020-06-14 DIAGNOSIS — M9905 Segmental and somatic dysfunction of pelvic region: Secondary | ICD-10-CM | POA: Diagnosis not present

## 2020-06-14 DIAGNOSIS — M4306 Spondylolysis, lumbar region: Secondary | ICD-10-CM | POA: Diagnosis not present

## 2020-06-14 DIAGNOSIS — M9903 Segmental and somatic dysfunction of lumbar region: Secondary | ICD-10-CM | POA: Diagnosis not present

## 2020-06-14 DIAGNOSIS — M5431 Sciatica, right side: Secondary | ICD-10-CM | POA: Diagnosis not present

## 2020-06-15 DIAGNOSIS — M4306 Spondylolysis, lumbar region: Secondary | ICD-10-CM | POA: Diagnosis not present

## 2020-06-15 DIAGNOSIS — M9905 Segmental and somatic dysfunction of pelvic region: Secondary | ICD-10-CM | POA: Diagnosis not present

## 2020-06-15 DIAGNOSIS — M5431 Sciatica, right side: Secondary | ICD-10-CM | POA: Diagnosis not present

## 2020-06-15 DIAGNOSIS — M9903 Segmental and somatic dysfunction of lumbar region: Secondary | ICD-10-CM | POA: Diagnosis not present

## 2020-06-16 DIAGNOSIS — M4306 Spondylolysis, lumbar region: Secondary | ICD-10-CM | POA: Diagnosis not present

## 2020-06-16 DIAGNOSIS — M5431 Sciatica, right side: Secondary | ICD-10-CM | POA: Diagnosis not present

## 2020-06-16 DIAGNOSIS — M9903 Segmental and somatic dysfunction of lumbar region: Secondary | ICD-10-CM | POA: Diagnosis not present

## 2020-06-16 DIAGNOSIS — M9905 Segmental and somatic dysfunction of pelvic region: Secondary | ICD-10-CM | POA: Diagnosis not present

## 2020-06-18 NOTE — Progress Notes (Signed)
Cardiology Office Note  Date:  06/21/2020   ID:  Chase Saunders, DOB [birth year], MRN [id number]  PCP:  Asencion Gowda.[name], MD   Chief Complaint  Patient presents with  . Follow-up    6 month F/U-discuss med concerns; Patient is unsure of what he is to be taking    HPI:  70 year old male with past medical history of smoking for 20-25 years who stopped 20 years ago,  diabetes,  obesity,  hypertension,  moderate aortic valve stenosis in 2015, normal ejection fraction CAD aortic valve replacement with bypass grafting x2 Who presents for follow-up of his lightheadedness when bending over, fatigue, aortic valve  Last office visit October 2021 In follow-up today, some concerns with his medications He is taking metoprolol tartrate once a day On losartan 25, feels his blood pressure is running high Occasionally has lightheadedness if he stands up too quickly  Denies any chest pain or shortness of breath concerning for angina  No regular exercise program Reports A1c is running high, specifics not available  Cholesterol at goal Weight stable  EKG personally reviewed by myself on todays visit Normal sinus rhythm with rate 101 bpm nonspecific ST abnormality   Other past medical history reviewed aortic valve replacement with bypass grafting x2 September 24, 2019  Aortic Valve Replacement Edwards Inspiris Resilia Stented Bovine Pericardial Tissue Valve (size 36mm, ref #[id number], serial #[id number]) Left Internal Mammary Artery to Distal Left Anterior Descending Coronary ArterySaphenous Vein Graft to SecondObtuse Marginal Branch of Left Circumflex Coronary Artery Endoscopic Vein Harvest from RightThigh   Family history Brother recently had bypass surgery    PMH:   has a past medical history of Arthritis, Asthma, Cataract, Coronary artery disease, Diabetes mellitus without complication (HCC), Heart murmur, Hyperlipidemia, Hypertension, S/P aortic valve  replacement with bioprosthetic valve (09/24/2019), S/P CABG x 2 (09/24/2019), Severe aortic valve stenosis, and Shingles (09/2019).  PSH:    Past Surgical History:  Procedure Laterality Date  . AORTIC VALVE REPLACEMENT N/A 09/24/2019   Procedure: AORTIC VALVE REPLACEMENT (AVR) using Cyndee Brightly Resilia 23 MM Aortic Valve;  Surgeon: Purcell Nails, MD;  Location: [facility] OR;  Service: Open Heart Surgery;  Laterality: N/A;  . CARDIAC CATHETERIZATION    . COLONOSCOPY    . CORONARY ARTERY BYPASS GRAFT N/A 09/24/2019   Procedure: CORONARY ARTERY BYPASS GRAFTING (CABG) using LIMA to LAD; Endoscopic harvested right greater saphenous vein: SVG to OM2;  Surgeon: Purcell Nails, MD;  Location: [facility] OR;  Service: Open Heart Surgery;  Laterality: N/A;  . ENDOVEIN HARVEST OF GREATER SAPHENOUS VEIN Right 09/24/2019   Procedure: ENDOVEIN HARVEST OF GREATER SAPHENOUS VEIN;  Surgeon: Purcell Nails, MD;  Location: [facility] OR;  Service: Open Heart Surgery;  Laterality: Right;  . RIGHT/LEFT HEART CATH AND CORONARY ANGIOGRAPHY Bilateral 09/19/2019   Procedure: RIGHT/LEFT HEART CATH AND CORONARY ANGIOGRAPHY;  Surgeon: Antonieta Iba, MD;  Location: [facility] INVASIVE CV LAB;  Service: Cardiovascular;  Laterality: Bilateral;  . TEE WITHOUT CARDIOVERSION N/A 09/24/2019   Procedure: TRANSESOPHAGEAL ECHOCARDIOGRAM (TEE);  Surgeon: Purcell Nails, MD;  Location: [facility] OR;  Service: Open Heart Surgery;  Laterality: N/A;  . TONSILLECTOMY AND ADENOIDECTOMY  1969  . WISDOM TOOTH EXTRACTION      Current Outpatient Medications  Medication Sig Dispense Refill  . amoxicillin (AMOXIL) 500 MG tablet Take 500 mg by mouth. 4 tabs 1hr before dental work    . aspirin EC 81 MG tablet Take 81 mg by mouth at bedtime.     Marland Kitchen  CINNAMON PO Take 200 mcg by mouth daily.    . citalopram (CELEXA) 20 MG tablet Take 20 mg by mouth daily.    . Insulin Pen Needle 31G X 8 MM MISC Use as directed 100 each 3  . LANTUS SOLOSTAR 100 UNIT/ML Solostar Pen  Inject 10-16 Units into the skin daily. Take 20 mg in the morning and 15 mg at bedtime    . losartan (COZAAR) 25 MG tablet Take 0.5 tablets (12.5 mg total) by mouth daily. 45 tablet 3  . lovastatin (MEVACOR) 40 MG tablet Take 1 tablet (40 mg total) by mouth at bedtime. 90 tablet 3  . metFORMIN (GLUCOPHAGE-XR) 500 MG 24 hr tablet Take 500 mg by mouth at bedtime.    . metoprolol tartrate (LOPRESSOR) 25 MG tablet Take 0.5 tablets (12.5 mg total) by mouth 2 (two) times daily. 30 tablet 1  . Multiple Vitamins-Minerals (MENS MULTIVITAMIN PLUS) TABS Take by mouth daily.    . nitroGLYCERIN (NITROSTAT) 0.4 MG SL tablet Place 1 tablet (0.4 mg total) under the tongue every 5 (five) minutes as needed for chest pain. 25 tablet 3  . potassium chloride (KLOR-CON) 10 MEQ tablet Take 1 tablet (10 mEq total) by mouth 3 (three) times a week. 30 tablet 1   No current facility-administered medications for this visit.     Allergies:   Patient has no known allergies.   Social History:  The patient  reports that he quit smoking about 23 years ago. His smoking use included cigarettes. He has a 37.50 pack-year smoking history. He has never used smokeless tobacco. He reports that he does not drink alcohol and does not use drugs.   Family History:   family history includes Alcohol abuse in his mother; Cancer (age of onset: 10) in his father; Diabetes in his paternal grandmother; Heart disease in his father; Hypertension in his mother.   Review of Systems: Review of Systems  Constitutional: Negative.   HENT: Negative.   Respiratory: Negative.   Cardiovascular: Negative.   Gastrointestinal: Negative.   Musculoskeletal: Negative.   Neurological: Negative.   Psychiatric/Behavioral: Negative.   All other systems reviewed and are negative.  PHYSICAL EXAM: VS:  BP 130/86 (BP Location: Left Arm, Patient Position: Sitting, Cuff Size: Large)   Pulse (!) 101   Ht 5\' 10"  (1.778 m)   Wt 200 lb (90.7 kg)   SpO2 97%   BMI  28.70 kg/m  , BMI Body mass index is 28.7 kg/m. Constitutional:  oriented to person, place, and time. No distress.  HENT:  Head: Grossly normal Eyes:  no discharge. No scleral icterus.  Neck: No JVD, no carotid bruits  Cardiovascular: Regular rate and rhythm, no murmurs appreciated Pulmonary/Chest: Clear to auscultation bilaterally, no wheezes or rails Abdominal: Soft.  no distension.  no tenderness.  Musculoskeletal: Normal range of motion Neurological:  normal muscle tone. Coordination normal. No atrophy Skin: Skin warm and dry Psychiatric: normal affect, pleasant  Recent Labs: 09/22/2019: ALT 33 09/25/2019: Magnesium 2.5 09/29/2019: Hemoglobin 9.8; Platelets 268 10/28/2019: BUN 13; Creatinine, Ser 0.84; Potassium 4.1; Sodium 137    Lipid Panel Lab Results  Component Value Date   CHOL 78 09/26/2019   HDL 28 (L) 09/26/2019   LDLCALC 37 09/26/2019   TRIG 67 09/26/2019      Wt Readings from Last 3 Encounters:  06/21/20 200 lb (90.7 kg)  05/03/20 200 lb (90.7 kg)  01/19/20 205 lb (93 kg)     ASSESSMENT AND PLAN:  Problem List  Items Addressed This Visit      Cardiology Problems   Essential hypertension   Severe aortic valve stenosis   Relevant Orders   EKG 12-Lead     Other   Type 2 diabetes mellitus with hyperglycemia, with long-term current use of insulin (HCC)    Other Visit Diagnoses    Coronary artery disease of native artery of native heart with stable angina pectoris (HCC)    -  Primary   Relevant Orders   EKG 12-Lead   Hyperlipidemia LDL goal <70       S/P AVR       S/P CABG (coronary artery bypass graft)       Lightheadedness       Severe aortic stenosis         Aortic valve stenosis Recent successful surgery AVR 09/2019 August 2021 echo : stable AV bioprosthetic valve  CAD with chronic stable angina Recent bypass x2 Denies anginal symptoms Recommend he stay active, walking program  Essential hypertension Suggested he change the metoprolol to  tartrate which she is only taking once a day to metoprolol succinate 25 daily.  Heart rate elevated on today's visit He is concerned about high blood pressure at home, suggested if this continues to run high that he increase his losartan from 25 mg daily up to 50 mg daily He will check orthostatics at home  Hyperlipidemia Cholesterol is at goal on the current lipid regimen. No changes to the medications were made.   Total encounter time more than 25 minutes  Greater than 50% was spent in counseling and coordination of care with the patient    Signed, Dossie Arbour, M.D., Ph.D. [facility] [place], [place] [phone]

## 2020-06-21 ENCOUNTER — Ambulatory Visit (INDEPENDENT_AMBULATORY_CARE_PROVIDER_SITE_OTHER): Payer: Medicare HMO | Admitting: Cardiovascular Disease

## 2020-06-21 ENCOUNTER — Other Ambulatory Visit: Payer: Self-pay

## 2020-06-21 ENCOUNTER — Encounter: Payer: Self-pay | Admitting: Cardiovascular Disease

## 2020-06-21 VITALS — BP 130/86 | HR 101 | Ht 70.0 in | Wt 200.0 lb

## 2020-06-21 DIAGNOSIS — I10 Essential (primary) hypertension: Secondary | ICD-10-CM

## 2020-06-21 DIAGNOSIS — E1165 Type 2 diabetes mellitus with hyperglycemia: Secondary | ICD-10-CM | POA: Diagnosis not present

## 2020-06-21 DIAGNOSIS — E785 Hyperlipidemia, unspecified: Secondary | ICD-10-CM

## 2020-06-21 DIAGNOSIS — Z951 Presence of aortocoronary bypass graft: Secondary | ICD-10-CM | POA: Diagnosis not present

## 2020-06-21 DIAGNOSIS — I25118 Atherosclerotic heart disease of native coronary artery with other forms of angina pectoris: Secondary | ICD-10-CM

## 2020-06-21 DIAGNOSIS — I350 Nonrheumatic aortic (valve) stenosis: Secondary | ICD-10-CM | POA: Diagnosis not present

## 2020-06-21 DIAGNOSIS — Z794 Long term (current) use of insulin: Secondary | ICD-10-CM | POA: Diagnosis not present

## 2020-06-21 DIAGNOSIS — Z952 Presence of prosthetic heart valve: Secondary | ICD-10-CM | POA: Diagnosis not present

## 2020-06-21 DIAGNOSIS — R42 Dizziness and giddiness: Secondary | ICD-10-CM | POA: Diagnosis not present

## 2020-06-21 MED ORDER — METOPROLOL SUCCINATE ER 25 MG PO TB24: 25.0000 mg | ORAL_TABLET | Freq: Every day | ORAL | 3 refills | Status: DC

## 2020-06-21 MED ORDER — LOVASTATIN 40 MG PO TABS: 40.0000 mg | ORAL_TABLET | Freq: Every day | ORAL | 3 refills | Status: DC

## 2020-06-21 MED ORDER — LOSARTAN POTASSIUM 50 MG PO TABS: 50.0000 mg | ORAL_TABLET | Freq: Every day | ORAL | 3 refills | Status: DC

## 2020-06-21 NOTE — Patient Instructions (Addendum)
Medication Instructions:  Stop metoprolol tartrate Start metoprolol succinate 25 mg daily  See how you feel, If no dizzy spells and blood pressure high, then: Losartan up to 50 mg daily   If you need a refill on your cardiac medications before your next appointment, please call your pharmacy.    Lab work: No new labs needed   If you have labs (blood work) drawn today and your tests are completely normal, you will receive your results only by: Marland Kitchen MyChart Message (if you have MyChart) OR . A paper copy in the mail If you have any lab test that is abnormal or we need to change your treatment, we will call you to review the results.   Testing/Procedures: No new testing needed   Follow-Up: At [facility], you and your health needs are our priority.  As part of our continuing mission to provide you with exceptional heart care, we have created designated Provider Care Teams.  These Care Teams include your primary Cardiologist (physician) and Advanced Practice Providers (APPs -  Physician Assistants and Nurse Practitioners) who all work together to provide you with the care you need, when you need it.  . You will need a follow up appointment in 6 months  . Providers on your designated Care Team:   . Nicolasa Ducking, NP . Eula Listen, PA-C . Marisue Ivan, PA-C  Any Other Special Instructions Will Be Listed Below (If Applicable).  COVID-19 Vaccine Information can be found at: [email or web address] For questions related to vaccine distribution or appointments, please email [email or web address] or call [phone].

## 2020-06-22 DIAGNOSIS — M4306 Spondylolysis, lumbar region: Secondary | ICD-10-CM | POA: Diagnosis not present

## 2020-06-22 DIAGNOSIS — M5431 Sciatica, right side: Secondary | ICD-10-CM | POA: Diagnosis not present

## 2020-06-22 DIAGNOSIS — M9905 Segmental and somatic dysfunction of pelvic region: Secondary | ICD-10-CM | POA: Diagnosis not present

## 2020-06-22 DIAGNOSIS — M9903 Segmental and somatic dysfunction of lumbar region: Secondary | ICD-10-CM | POA: Diagnosis not present

## 2020-06-25 DIAGNOSIS — M9903 Segmental and somatic dysfunction of lumbar region: Secondary | ICD-10-CM | POA: Diagnosis not present

## 2020-06-25 DIAGNOSIS — M9905 Segmental and somatic dysfunction of pelvic region: Secondary | ICD-10-CM | POA: Diagnosis not present

## 2020-06-25 DIAGNOSIS — M5431 Sciatica, right side: Secondary | ICD-10-CM | POA: Diagnosis not present

## 2020-06-25 DIAGNOSIS — M4306 Spondylolysis, lumbar region: Secondary | ICD-10-CM | POA: Diagnosis not present

## 2020-06-28 DIAGNOSIS — M25612 Stiffness of left shoulder, not elsewhere classified: Secondary | ICD-10-CM | POA: Diagnosis not present

## 2020-06-28 DIAGNOSIS — M5431 Sciatica, right side: Secondary | ICD-10-CM | POA: Diagnosis not present

## 2020-06-28 DIAGNOSIS — M4306 Spondylolysis, lumbar region: Secondary | ICD-10-CM | POA: Diagnosis not present

## 2020-06-28 DIAGNOSIS — M9905 Segmental and somatic dysfunction of pelvic region: Secondary | ICD-10-CM | POA: Diagnosis not present

## 2020-06-28 DIAGNOSIS — M9903 Segmental and somatic dysfunction of lumbar region: Secondary | ICD-10-CM | POA: Diagnosis not present

## 2020-06-28 DIAGNOSIS — M25511 Pain in right shoulder: Secondary | ICD-10-CM | POA: Diagnosis not present

## 2020-06-30 DIAGNOSIS — M9905 Segmental and somatic dysfunction of pelvic region: Secondary | ICD-10-CM | POA: Diagnosis not present

## 2020-06-30 DIAGNOSIS — M9903 Segmental and somatic dysfunction of lumbar region: Secondary | ICD-10-CM | POA: Diagnosis not present

## 2020-06-30 DIAGNOSIS — M4306 Spondylolysis, lumbar region: Secondary | ICD-10-CM | POA: Diagnosis not present

## 2020-06-30 DIAGNOSIS — M5431 Sciatica, right side: Secondary | ICD-10-CM | POA: Diagnosis not present

## 2020-06-30 DIAGNOSIS — M25612 Stiffness of left shoulder, not elsewhere classified: Secondary | ICD-10-CM | POA: Diagnosis not present

## 2020-06-30 DIAGNOSIS — M25511 Pain in right shoulder: Secondary | ICD-10-CM | POA: Diagnosis not present

## 2020-07-01 DIAGNOSIS — S42211A Unspecified displaced fracture of surgical neck of right humerus, initial encounter for closed fracture: Secondary | ICD-10-CM | POA: Diagnosis not present

## 2020-07-06 DIAGNOSIS — M25612 Stiffness of left shoulder, not elsewhere classified: Secondary | ICD-10-CM | POA: Diagnosis not present

## 2020-07-06 DIAGNOSIS — M9903 Segmental and somatic dysfunction of lumbar region: Secondary | ICD-10-CM | POA: Diagnosis not present

## 2020-07-06 DIAGNOSIS — M4306 Spondylolysis, lumbar region: Secondary | ICD-10-CM | POA: Diagnosis not present

## 2020-07-06 DIAGNOSIS — M5431 Sciatica, right side: Secondary | ICD-10-CM | POA: Diagnosis not present

## 2020-07-06 DIAGNOSIS — M9905 Segmental and somatic dysfunction of pelvic region: Secondary | ICD-10-CM | POA: Diagnosis not present

## 2020-07-06 DIAGNOSIS — M25511 Pain in right shoulder: Secondary | ICD-10-CM | POA: Diagnosis not present

## 2020-09-13 ENCOUNTER — Encounter: Payer: Medicare HMO | Admitting: Thoracic Surgery (Cardiothoracic Vascular Surgery)

## 2020-10-22 DIAGNOSIS — E7800 Pure hypercholesterolemia, unspecified: Secondary | ICD-10-CM | POA: Diagnosis not present

## 2020-10-22 DIAGNOSIS — E1169 Type 2 diabetes mellitus with other specified complication: Secondary | ICD-10-CM | POA: Diagnosis not present

## 2020-10-22 DIAGNOSIS — F411 Generalized anxiety disorder: Secondary | ICD-10-CM | POA: Diagnosis not present

## 2020-10-22 DIAGNOSIS — I10 Essential (primary) hypertension: Secondary | ICD-10-CM | POA: Diagnosis not present

## 2020-12-23 DIAGNOSIS — I700 Atherosclerosis of aorta: Secondary | ICD-10-CM | POA: Diagnosis not present

## 2020-12-23 DIAGNOSIS — Z952 Presence of prosthetic heart valve: Secondary | ICD-10-CM | POA: Diagnosis not present

## 2020-12-23 DIAGNOSIS — E785 Hyperlipidemia, unspecified: Secondary | ICD-10-CM | POA: Diagnosis not present

## 2020-12-23 DIAGNOSIS — I10 Essential (primary) hypertension: Secondary | ICD-10-CM | POA: Diagnosis not present

## 2020-12-23 DIAGNOSIS — F39 Unspecified mood [affective] disorder: Secondary | ICD-10-CM | POA: Diagnosis not present

## 2020-12-23 DIAGNOSIS — Z794 Long term (current) use of insulin: Secondary | ICD-10-CM | POA: Diagnosis not present

## 2020-12-23 DIAGNOSIS — I2510 Atherosclerotic heart disease of native coronary artery without angina pectoris: Secondary | ICD-10-CM | POA: Diagnosis not present

## 2020-12-23 DIAGNOSIS — E1169 Type 2 diabetes mellitus with other specified complication: Secondary | ICD-10-CM | POA: Diagnosis not present

## 2021-01-06 ENCOUNTER — Telehealth: Payer: Self-pay | Admitting: Cardiovascular Disease

## 2021-01-06 NOTE — Telephone Encounter (Signed)
Pt c/o BP issue: STAT if pt c/o blurred vision, one-sided weakness or slurred speech  1. What are your last 5 BP readings? 107/69 today   2. Are you having any other symptoms (ex. Dizziness, headache, blurred vision, passed out)? Fatigue 3-4 weeks, last 4 months  rising to quick bending over feels pre syncopal    3. What is your BP issue? Was taking losartan decreased to 25 mg per pcp and today did not take at all

## 2021-01-06 NOTE — Telephone Encounter (Signed)
I called and spoke with the patient. He advised for the last 3-4 weeks he has been feeling a fairly good amount of fatigue. 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 However, when able, he can nap for 2 hours during the day. He has not been checking his BP recently but did check this this morning at ~ 7:45 am x 3 readings= 105/68, 114/69, 104/70. He did not write down the heart rate.   He took his medications at 9:00 am, which included:  losartan 25 mg & metoprolol succinate 25 mg. He advised that his PCP did recently decrease the dose of the losartan down to 25 mg once daily due to his fatigue and feelings of pre-syncope when he bends over and stands up.  I advised the patient that I will need to forward to Dr. Mariah Milling to review, but recommendations may include: 1) splitting up the times when he take losartan & metoprolol 2) cutting down on the metoprolol dose  I have advised him to continue to check his BP, but have also instructed him on how to take orthostatic BP/ HR readings at home. He will try to obtain these readings for [place]. He confirms no other recent medication changes and that he is staying hydrated.   The patient is advised to continue his current medications at this time, and that we will call him back after reviewing with Dr. Mariah Milling. The patient voices understanding and is agreeable.

## 2021-01-07 NOTE — Telephone Encounter (Signed)
I spoke with the patient and advised him of Dr. Windell Hummingbird recommendations to split up the times when taking losartan & metoprolol.  Losartan in the AM Metoprolol in the PM Increase fluids, especially in the AM Continue to monitor BP's  The patient voices understanding and is agreeable with all of the above. He also advised that he will obtain orthostatic BP readings over the weekend.  I have asked that he touch base next week with how his BP's are trending/ how he is feeling and what the orthostatic readings are.  He is once again agreeable and very appreciative for the call back.

## 2021-01-07 NOTE — Telephone Encounter (Signed)
Antonieta Iba, MD  Sent: Caleen Essex January 07, 2021  7:39 AM  To: Jefferey Pica, RN          Message  Lets try to split them up (metoprolol in PM)  Increase fluids, especially first thing in AM  Monitor BPs  TG

## 2021-01-11 NOTE — Telephone Encounter (Signed)
Able to return call to Chase Saunders, he wanted to f/u on his BP readings and nurse Chase Saunders advised him to call back with orthostatic BP/HR readings. Pt reports  Orthostatic BP Laying 113/69 HR 87 After 1 min Sitting 124/66 HR 8 After 1 min Standing 116/71 HR 81 After 3 mins Standing 113/71 HR 80  Pt denies dizziness or lightheadedness at this time. Reports he did not realize his wife "took me off the losartan last Sat, I did not know" Adivsed pt to start with Dr. Windell Hummingbird instructions of:  Losartan in the AM Metoprolol in the PM  Starting tomorrow to continue to increase fluid intake int he am, as well as continuing to monitor his BP. Advised will r/t Dr. Mariah Milling for review and may call back in the meantime with any other concerns. Chase Saunders verbalized understanding and is thankful for call.

## 2021-01-11 NOTE — Telephone Encounter (Signed)
Patient calling wants to discuss BP readings

## 2021-01-24 NOTE — Telephone Encounter (Signed)
Attempted to reach out to Mr. Chase Saunders, LDM of Dr. Windell Hummingbird advice  "Pressures were good without the losartan  So lets hold off on the losartan for now see how the numbers go  Thx  Tgollan"  Advised to call back to f/u on BP and Losartan. Will await return call.

## 2021-01-25 NOTE — Telephone Encounter (Signed)
Attempted to reach out to pt for second time, LM on cell VM (DPR approved)  Advised on Dr. Ethelene Hal' recommendation of holding Losartan if BP continues to be WNL w/o medication, if BP trends back up, then reincorporate losartan 25 mg daily back into medication regiment.  Encouraged pt to call back with any concerns or questions.

## 2021-06-14 ENCOUNTER — Other Ambulatory Visit: Payer: Self-pay | Admitting: Cardiovascular Disease

## 2021-06-14 NOTE — Telephone Encounter (Signed)
Attempted to schedule.  LMOV to call office.  ° °

## 2021-06-14 NOTE — Telephone Encounter (Signed)
Please schedule overdue appointment for 90 day refills. Thank you! ?

## 2021-06-18 NOTE — Progress Notes (Signed)
Cardiology Office Note ? ?Date:  06/20/2021  ? ?IDCordella Saunders:  Chase Saunders, DOB [birth year], MRN [id number] ? ?PCP:  [name], L.[name], MD  ? ?Chief Complaint  ?Patient presents with  ? 6 month follow up   ?  "Doing well." Medications reviewed by the patient verbally.   ? ? ?HPI:  ?71 year old male with past medical history of ?smoking for 20-25 years who stopped 20 years ago,  ?diabetes,  ?obesity,  ?hypertension,  ?moderate aortic valve stenosis in 2015, normal ejection fraction ?CAD ?aortic valve replacement by Dr. Freida BusmanAllen for aortic valve stenosis with bypass grafting x2 ?Who presents for follow-up of his lightheadedness when bending over, fatigue, aortic valve replacement ? ?Last office visit April 2022 ?On that visit was taking metoprolol tartrate once a day, this was changed to metoprolol succinate ?Losartan 25 continued ? ?In general reports he feels well, no regular exercise program ?A1c continues to run high, in the 7 range ?Working with primary care, they have started a new medication ?Cut back on insulin ?Home sugars 141-200 ?Was started on metformin combo, he does not remember the name ? ?Cholesterol at goal ?Weight stable ? ?EKG personally reviewed by myself on todays visit ?Normal sinus rhythm with rate 81 bpm nonspecific ST abnormality ? ?Other past medical history reviewed ?aortic valve replacement with bypass grafting x2 ?September 24, 2019 ?Aortic Valve Replacement ?            Edwards Inspiris Resilia Stented Bovine Pericardial Tissue Valve (size 23mm, ref # [id number], serial # [id number]) ?            Left Internal Mammary Artery to Distal Left Anterior Descending Coronary Artery Saphenous Vein Graft to Second Obtuse Marginal Branch of Left Circumflex Coronary Artery Endoscopic Vein Harvest from Right Thigh  ? ?Family history ?Brother recently had bypass surgery ?  ? ? ?PMH:   has a past medical history of Arthritis, Asthma, Cataract, Coronary artery disease, Diabetes mellitus without complication (HCC), Heart  murmur, Hyperlipidemia, Hypertension, S/P aortic valve replacement with bioprosthetic valve (09/24/2019), S/P CABG x 2 (09/24/2019), Severe aortic valve stenosis, and Shingles (09/2019). ? ?PSH:    ?Past Surgical History:  ?Procedure Laterality Date  ? AORTIC VALVE REPLACEMENT N/A 09/24/2019  ? Procedure: AORTIC VALVE REPLACEMENT (AVR) using Cyndee BrightlyEdwards INSPIRIS Resilia 23 MM Aortic Valve;  Surgeon: Purcell Nailswen, Clarence H, MD;  [facility];  Service: Open Heart Surgery;  Laterality: N/A;  ? CARDIAC CATHETERIZATION    ? COLONOSCOPY    ? CORONARY ARTERY BYPASS GRAFT N/A 09/24/2019  ? Procedure: CORONARY ARTERY BYPASS GRAFTING (CABG) using LIMA to LAD; Endoscopic harvested right greater saphenous vein: SVG to OM2;  Surgeon: Purcell Nailswen, Clarence H, MD;  [facility];  Service: Open Heart Surgery;  Laterality: N/A;  ? ENDOVEIN HARVEST OF GREATER SAPHENOUS VEIN Right 09/24/2019  ? Procedure: ENDOVEIN HARVEST OF GREATER SAPHENOUS VEIN;  Surgeon: Purcell Nailswen, Clarence H, MD;  [facility];  Service: Open Heart Surgery;  Laterality: Right;  ? RIGHT/LEFT HEART CATH AND CORONARY ANGIOGRAPHY Bilateral 09/19/2019  ? Procedure: RIGHT/LEFT HEART CATH AND CORONARY ANGIOGRAPHY;  Surgeon: Antonieta IbaGollan, Quyen Cutsforth J, MD;  Location: [facility] INVASIVE CV LAB;  Service: Cardiovascular;  Laterality: Bilateral;  ? TEE WITHOUT CARDIOVERSION N/A 09/24/2019  ? Procedure: TRANSESOPHAGEAL ECHOCARDIOGRAM (TEE);  Surgeon: Purcell Nailswen, Clarence H, MD;  [facility];  Service: Open Heart Surgery;  Laterality: N/A;  ? TONSILLECTOMY AND ADENOIDECTOMY  1969  ? WISDOM TOOTH EXTRACTION    ? ? ?Current Outpatient Medications  ?Medication Sig  Dispense Refill  ? aspirin EC 81 MG tablet Take 81 mg by mouth at bedtime.     ? CINNAMON PO Take 200 mcg by mouth daily.    ? citalopram (CELEXA) 20 MG tablet Take 20 mg by mouth daily.    ? Insulin Pen Needle 31G X 8 MM MISC Use as directed 100 each 3  ? LANTUS SOLOSTAR 100 UNIT/ML Solostar Pen Inject 10-16 Units into the skin daily. Take 20 mg in the  morning and 15 mg at bedtime    ? losartan (COZAAR) 25 MG tablet Take 1 tablet (25 mg) by mouth once daily    ? lovastatin (MEVACOR) 40 MG tablet Take 1 tablet (40 mg total) by mouth at bedtime. 90 tablet 3  ? metoprolol succinate (TOPROL-XL) 25 MG 24 hr tablet Take 1 tablet (25 mg total) by mouth daily. Take with or immediately following a meal. 90 tablet 3  ? Multiple Vitamins-Minerals (MENS MULTIVITAMIN PLUS) TABS Take by mouth daily.    ? nitroGLYCERIN (NITROSTAT) 0.4 MG SL tablet Place 1 tablet (0.4 mg total) under the tongue every 5 (five) minutes as needed for chest pain. 25 tablet 3  ? amoxicillin (AMOXIL) 500 MG tablet Take 500 mg by mouth. 4 tabs 1hr before dental work (Patient not taking: Reported on 06/20/2021)    ? metFORMIN (GLUCOPHAGE-XR) 500 MG 24 hr tablet Take 500 mg by mouth at bedtime. (Patient not taking: Reported on 06/20/2021)    ? ?No current facility-administered medications for this visit.  ? ? ? ?Allergies:   Patient has no known allergies.  ? ?Social History:  The patient  reports that he quit smoking about 24 years ago. His smoking use included cigarettes. He has a 37.50 pack-year smoking history. He has never used smokeless tobacco. He reports that he does not drink alcohol and does not use drugs.  ? ?Family History:   family history includes Alcohol abuse in his mother; Cancer (age of onset: 7) in his father; Diabetes in his paternal grandmother; Heart disease in his father; Hypertension in his mother.  ? ?Review of Systems: ?Review of Systems  ?Constitutional: Negative.   ?HENT: Negative.    ?Respiratory: Negative.    ?Cardiovascular: Negative.   ?Gastrointestinal: Negative.   ?Musculoskeletal: Negative.   ?Neurological: Negative.   ?Psychiatric/Behavioral: Negative.    ?All other systems reviewed and are negative. ? ?PHYSICAL EXAM: ?VS:  BP 104/60 (BP Location: Left Arm, Patient Position: Sitting, Cuff Size: Normal)   Pulse 81   Ht 5\' 10"  (1.778 m)   Wt 90.8 kg   SpO2 98%   BMI  28.73 kg/m?  , BMI Body mass index is 28.73 kg/m? ?Constitutional:  oriented to person, place, and time. No distress.  ?HENT:  ?Head: Grossly normal ?Eyes:  no discharge. No scleral icterus.  ?Neck: No JVD, no carotid bruits  ?Cardiovascular: Regular rate and rhythm, no murmurs appreciated ?Pulmonary/Chest: Clear to auscultation bilaterally, no wheezes or rails ?Abdominal: Soft.  no distension.  no tenderness.  ?Musculoskeletal: Normal range of motion ?Neurological:  normal muscle tone. Coordination normal. No atrophy ?Skin: Skin warm and dry ?Psychiatric: normal affect, pleasant ? ?Recent Labs: ?No results found for requested labs within last 8760 hours.  ? ? ?Lipid Panel ?Lab Results  ?Component Value Date  ? CHOL 78 09/26/2019  ? HDL 28 (L) 09/26/2019  ? LDLCALC 37 09/26/2019  ? TRIG 67 09/26/2019  ? ?  ? ?Wt Readings from Last 3 Encounters:  ?06/20/21 90.8 kg  ?  06/21/20 90.7 kg  ?05/03/20 90.7 kg  ?  ? ?ASSESSMENT AND PLAN: ? ?Problem List Items Addressed This Visit   ? ? Type 2 diabetes mellitus with hyperglycemia, with long-term current use of insulin (HCC)  ? Mixed hyperlipidemia  ? Essential hypertension  ? Severe aortic valve stenosis  ? ?Other Visit Diagnoses   ? ? Coronary artery disease of native artery of native heart with stable angina pectoris (HCC)    -  Primary  ? S/P AVR      ? S/P CABG (coronary artery bypass graft)      ? ?  ?Aortic valve stenosis ?Recent successful surgery ?AVR 09/2019 ?August 2021 echo : stable AV bioprosthetic valve ?No significant murmur on exam ? ?CAD with chronic stable angina ?History of bypass x2 ?Denies anginal symptoms ?Recommended regular walking program, aggressive diabetes management ?Cholesterol at goal ? ?Essential hypertension ?Continue metoprolol succinate 25 daily, losartan 25 daily ?Hydrate for orthostasis symptoms ? ?Hyperlipidemia ?Cholesterol is at goal on the current lipid regimen. No changes to the medications were made. ? ?Diabetes type 2 with  complications ?Recommend he work closely with primary care, they have recently started day new metformin combo pill, he does not remember the name ?They have cut back on his insulin ? ? Total encounter time more than 30 m

## 2021-06-20 ENCOUNTER — Encounter: Payer: Self-pay | Admitting: Cardiovascular Disease

## 2021-06-20 ENCOUNTER — Ambulatory Visit: Payer: Medicare HMO | Admitting: Cardiovascular Disease

## 2021-06-20 VITALS — BP 104/60 | HR 81 | Ht 70.0 in | Wt 200.2 lb

## 2021-06-20 DIAGNOSIS — I10 Essential (primary) hypertension: Secondary | ICD-10-CM

## 2021-06-20 DIAGNOSIS — I350 Nonrheumatic aortic (valve) stenosis: Secondary | ICD-10-CM | POA: Diagnosis not present

## 2021-06-20 DIAGNOSIS — E782 Mixed hyperlipidemia: Secondary | ICD-10-CM

## 2021-06-20 DIAGNOSIS — Z951 Presence of aortocoronary bypass graft: Secondary | ICD-10-CM

## 2021-06-20 DIAGNOSIS — I25118 Atherosclerotic heart disease of native coronary artery with other forms of angina pectoris: Secondary | ICD-10-CM | POA: Diagnosis not present

## 2021-06-20 DIAGNOSIS — E1165 Type 2 diabetes mellitus with hyperglycemia: Secondary | ICD-10-CM

## 2021-06-20 DIAGNOSIS — Z794 Long term (current) use of insulin: Secondary | ICD-10-CM

## 2021-06-20 DIAGNOSIS — Z952 Presence of prosthetic heart valve: Secondary | ICD-10-CM | POA: Diagnosis not present

## 2021-06-20 MED ORDER — METOPROLOL SUCCINATE ER 25 MG PO TB24: 25.0000 mg | ORAL_TABLET | Freq: Every day | ORAL | 3 refills | Status: DC

## 2021-06-20 MED ORDER — LOVASTATIN 40 MG PO TABS: 40.0000 mg | ORAL_TABLET | Freq: Every day | ORAL | 3 refills | Status: DC

## 2021-06-20 MED ORDER — LOSARTAN POTASSIUM 25 MG PO TABS: 25.0000 mg | ORAL_TABLET | Freq: Every day | ORAL | 3 refills | Status: DC

## 2021-06-20 NOTE — Patient Instructions (Signed)
Chase Saunders, PMD ?We will request labs ? ? ?Medication Instructions:  ?No changes ? ?If you need a refill on your cardiac medications before your next appointment, please call your pharmacy.  ? ?Lab work: ?No new labs needed ? ?Testing/Procedures: ?No new testing needed ? ?Follow-Up: ?At [facility], you and your health needs are our priority.  As part of our continuing mission to provide you with exceptional heart care, we have created designated Provider Care Teams.  These Care Teams include your primary Cardiologist (physician) and Advanced Practice Providers (APPs -  Physician Assistants and Nurse Practitioners) who all work together to provide you with the care you need, when you need it. ? ?You will need a follow up appointment in 12 months ? ?Providers on your designated Care Team:   ?Murray Hodgkins, NP ?Christell Faith, PA-C ?Cadence Kathlen Mody, PA-C ? ?COVID-19 Vaccine Information can be found at: [email or web address] For questions related to vaccine distribution or appointments, please email [email or web address] or call [phone].  ? ?

## 2021-09-11 IMAGING — DX DG CHEST 1V PORT
1 series · 1 of 1 positions shown · non-contrast
Comparison: 09/24/2019

CLINICAL DATA: Chest tube, status post CABG

EXAM:
PORTABLE CHEST 1 VIEW

[chest ap]
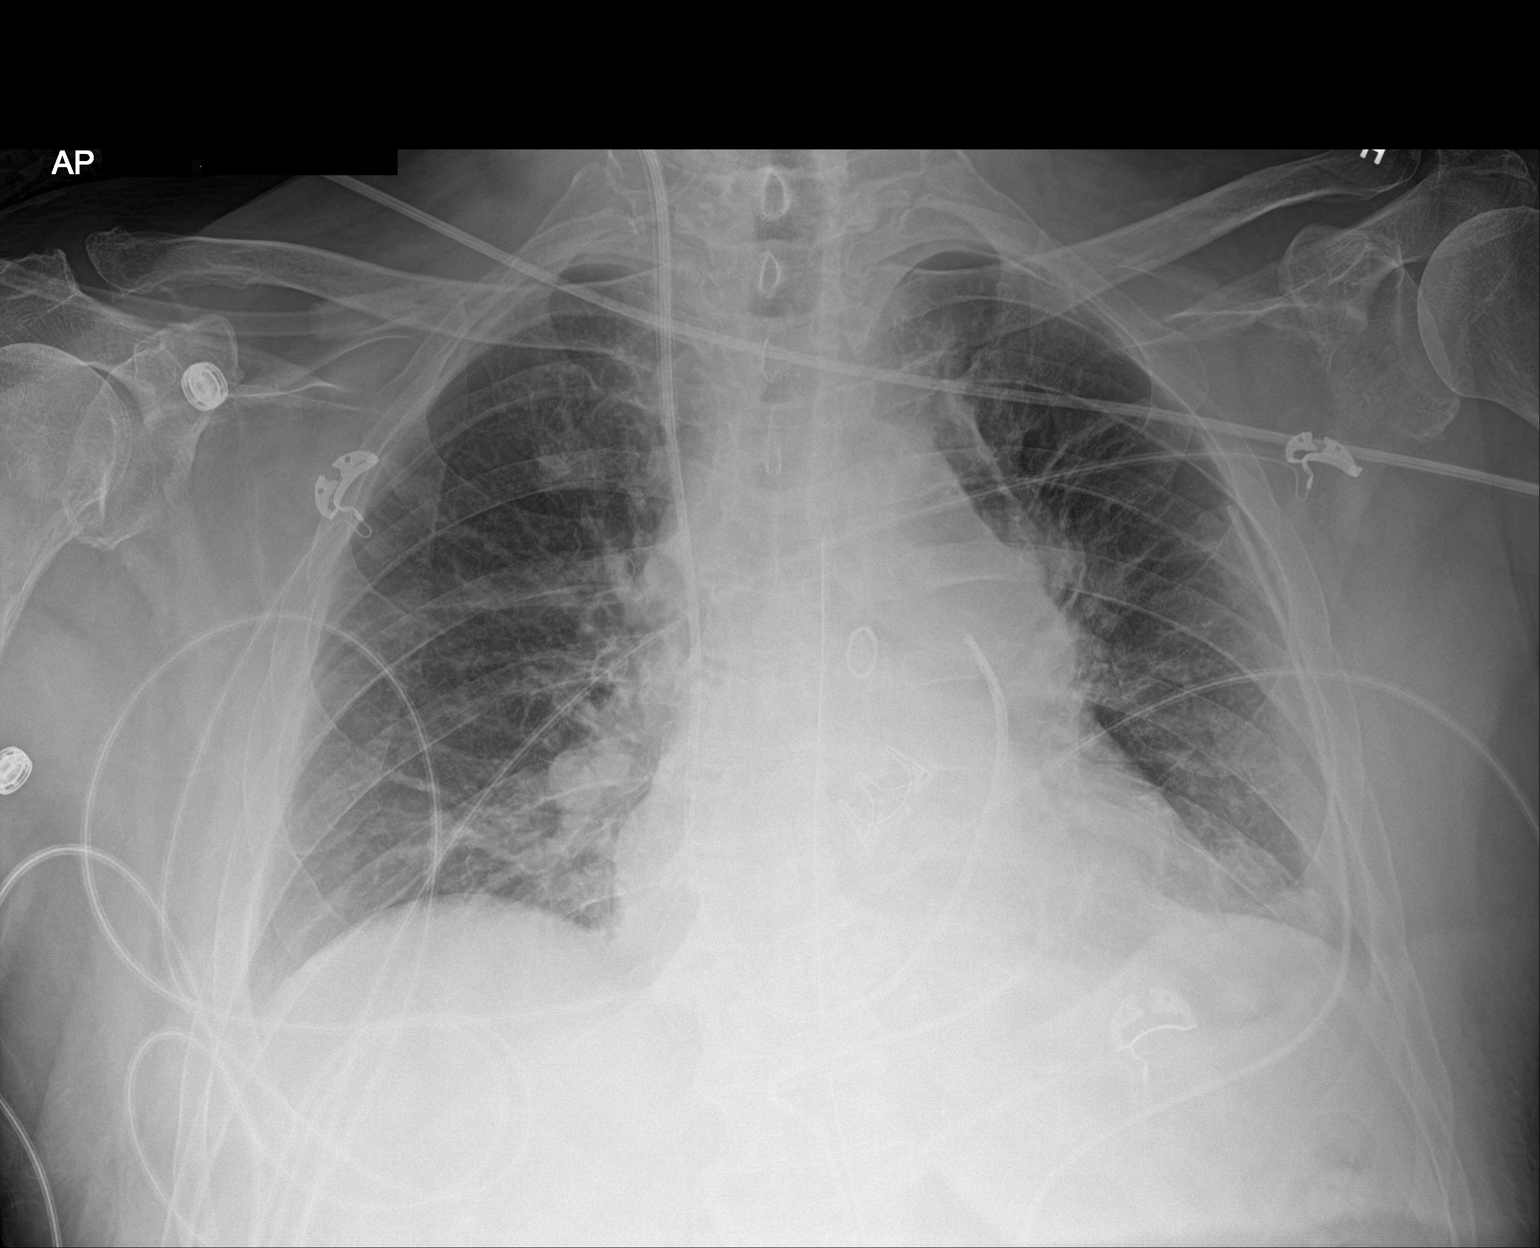

[1 of 1 positions shown; findings below may reference images not displayed]

FINDINGS: Interval endotracheal and esophagogastric extubation. Support
apparatus is otherwise unchanged including right neck pulmonary
vascular catheter, tip projecting over the pulmonary outflow tract,
and left chest and mediastinal drainage tubes. There is minimal,
less than 5% left apical pneumothorax. Mild, diffuse interstitial
pulmonary opacity. Mild cardiomegaly with aortic valve prosthesis
and CABG markers. No acute airspace opacity.
IMPRESSION: 1. Interval endotracheal and esophagogastric extubation. Other
support apparatus unchanged.
2. There is minimal, less than 5% left apical pneumothorax with
chest tubes in position.
3. Mild, diffuse interstitial pulmonary opacity, likely edema. No
acute airspace opacity.

## 2021-09-13 IMAGING — CR DG CHEST 2V
2 series · 2 of 2 positions shown · non-contrast
Comparison: 09/26/2019

CLINICAL DATA: Evaluate for atelectasis.

EXAM:
CHEST - 2 VIEW

[chest lat]
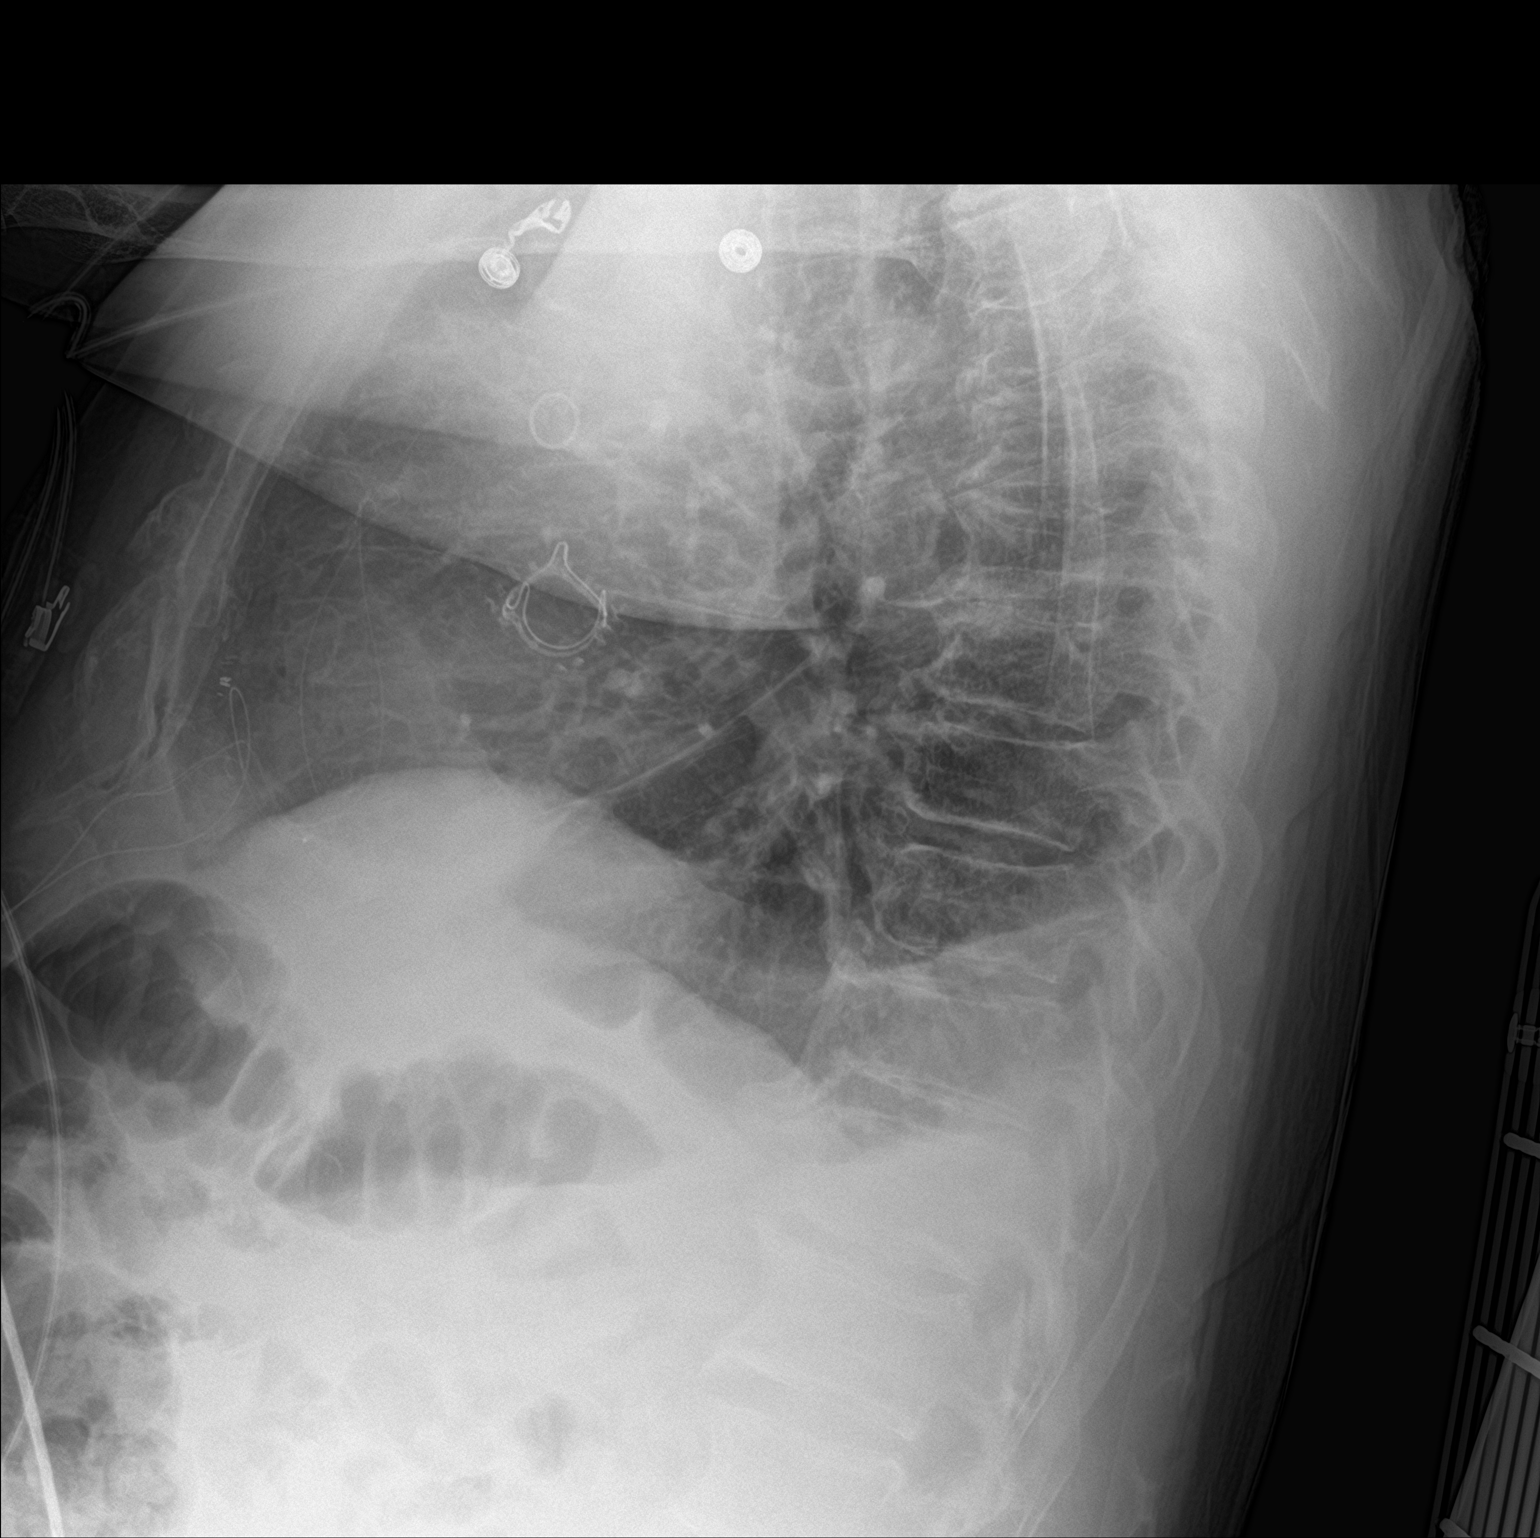

[chest ap]
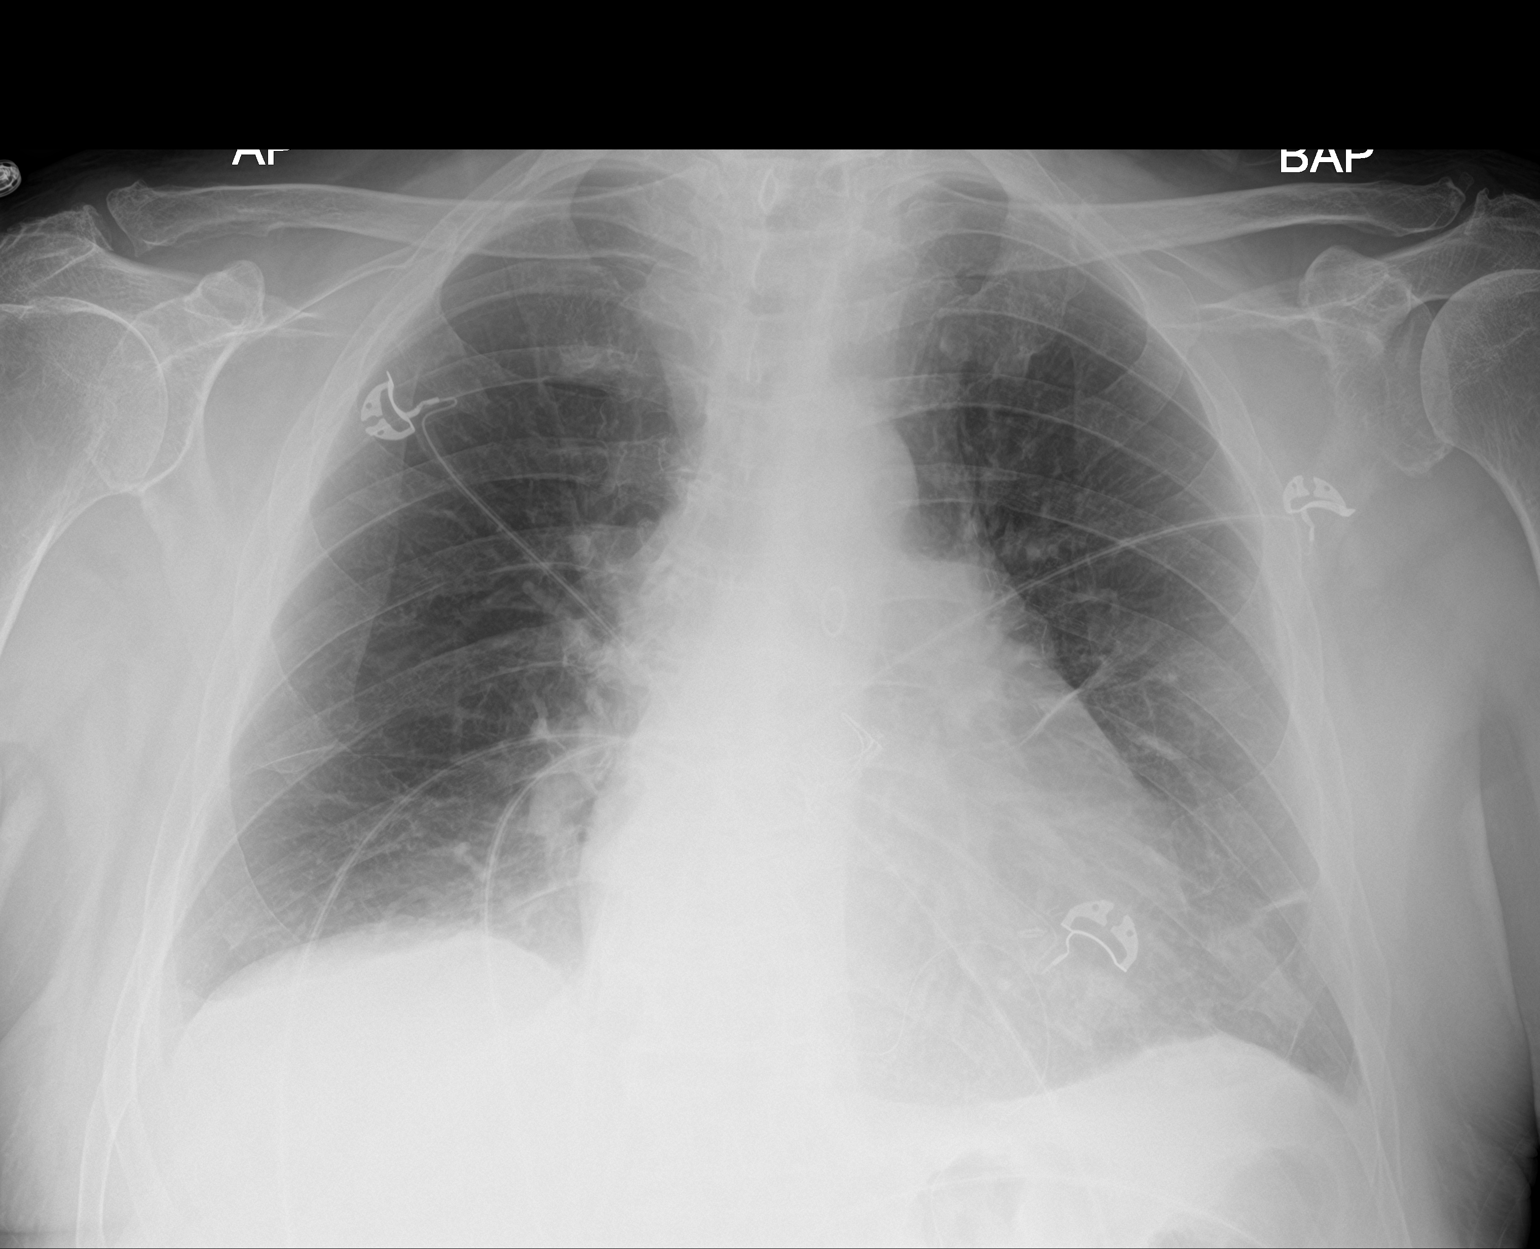

[2 of 2 positions shown; findings below may reference images not displayed]

FINDINGS: Normal heart size. Small bilateral pleural effusions noted. The left
upper lobe platelike atelectasis is improved from previous exam. Two
small areas of linear atelectasis noted in the left midlung and left
lower lobe.
IMPRESSION: 1. Improving left upper lobe atelectasis.
2. Small bilateral pleural effusions.

## 2021-10-29 ENCOUNTER — Encounter: Payer: Self-pay | Admitting: Cardiovascular Disease

## 2021-11-03 IMAGING — DX DG CHEST 2V
2 series · 2 of 2 positions shown · non-contrast
Comparison: 10/13/2019 chest radiographs and earlier.

CLINICAL DATA: 68-year-old male status post CABG and valve
replacement in [REDACTED]. Chest pain.

EXAM:
CHEST - 2 VIEW

[dg chest 2 view (1 of 2)]
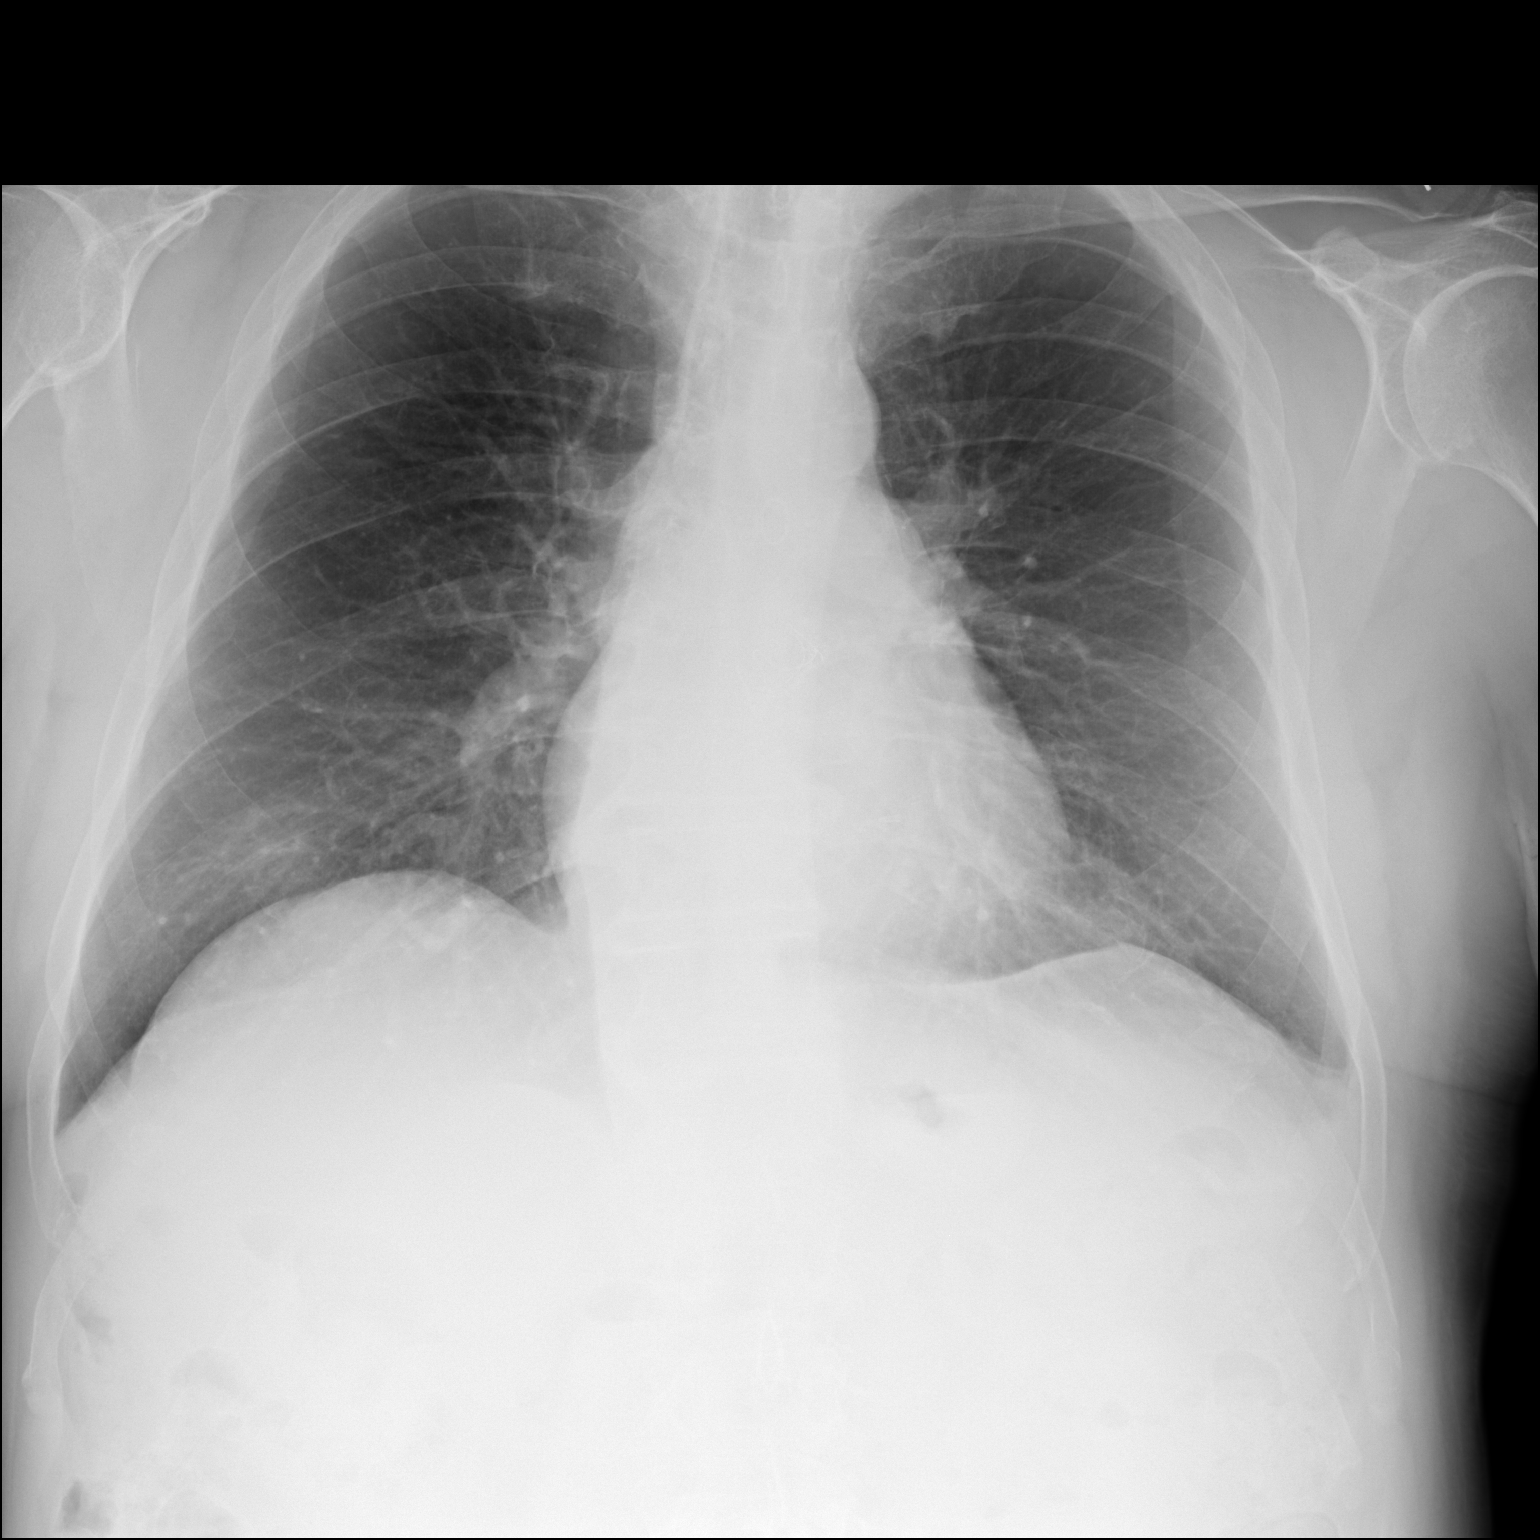

[dg chest 2 view (2 of 2)]
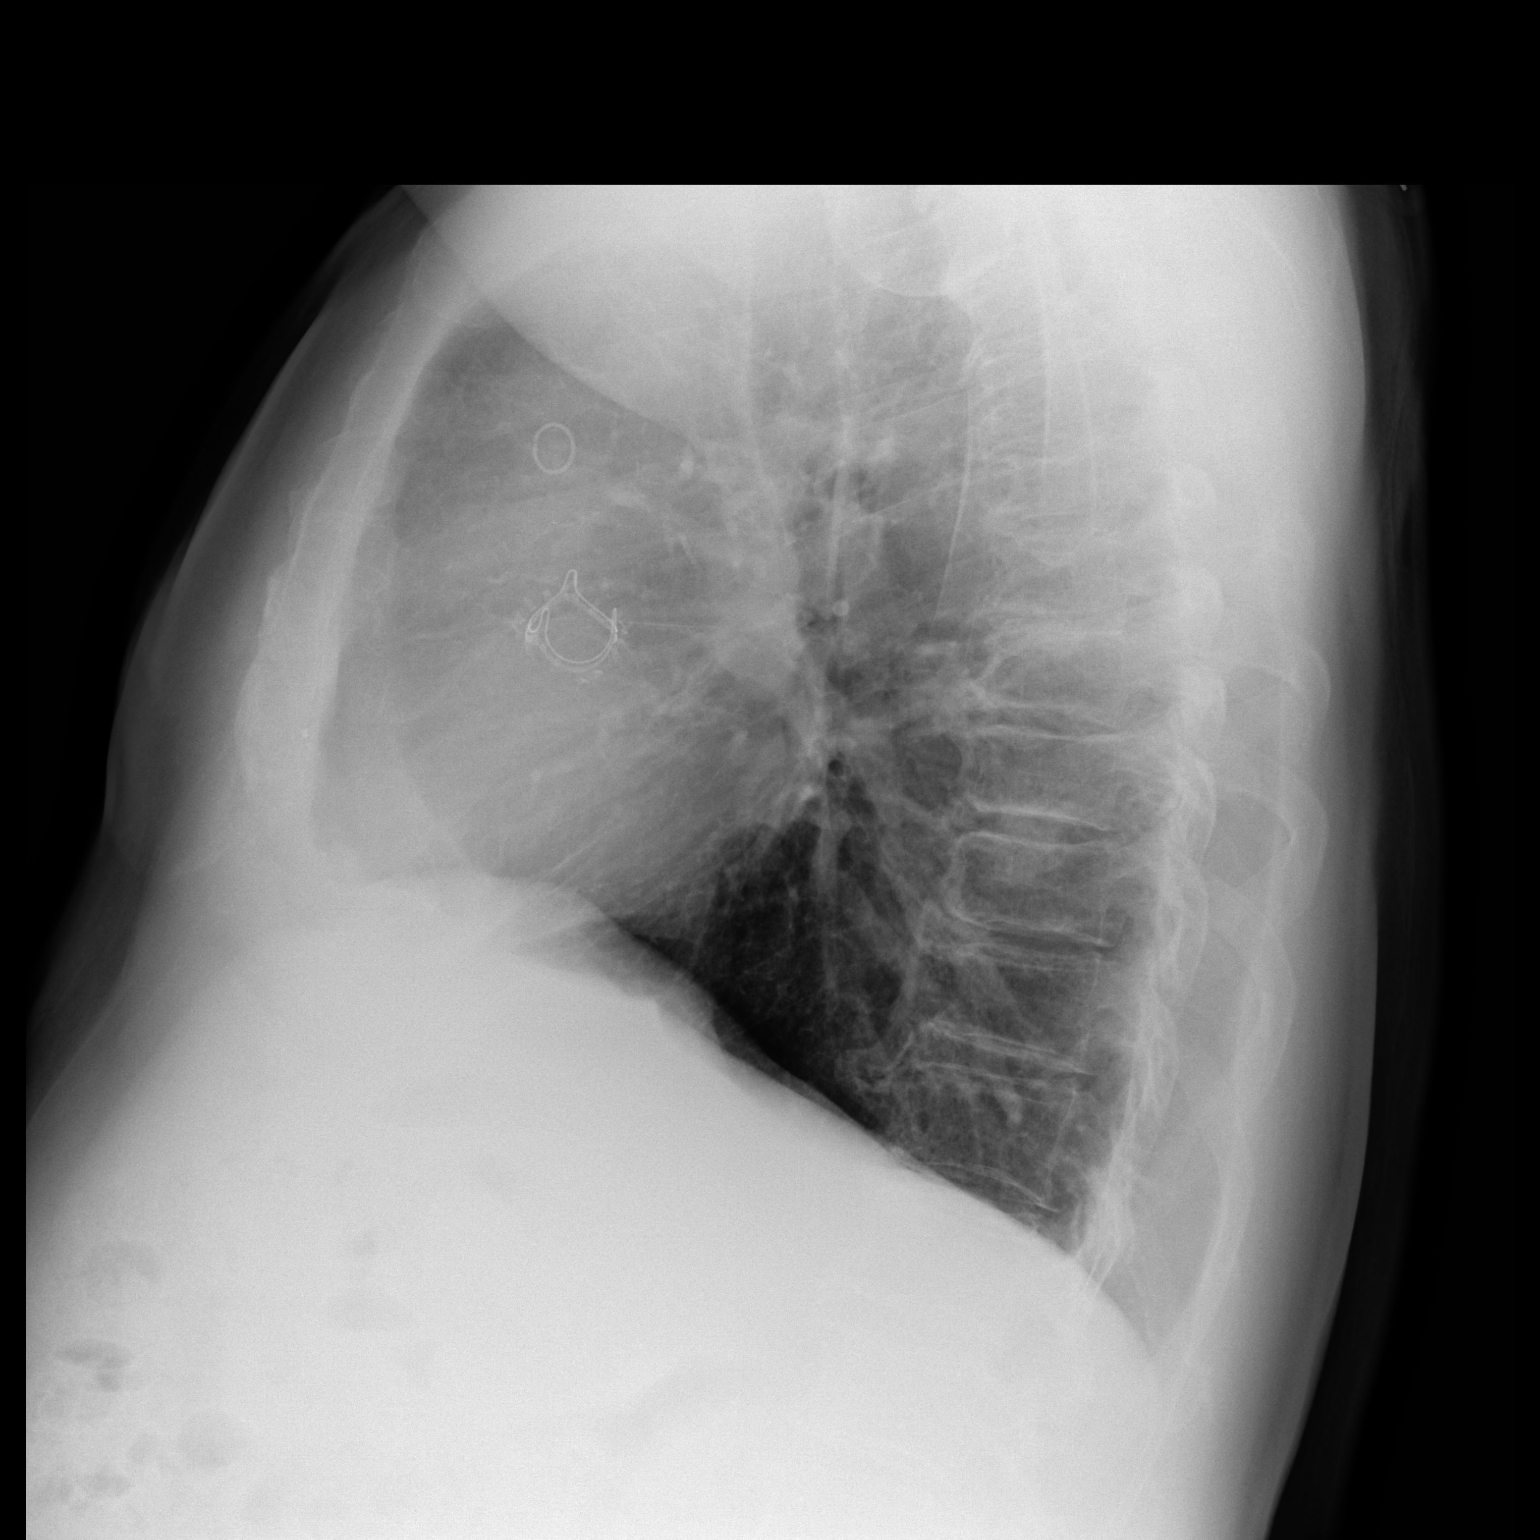

[2 of 2 positions shown; findings below may reference images not displayed]

FINDINGS: PA and lateral views. CABG marker and cardiac valve replacement
redemonstrated, although no metallic sternotomy wires in place.
Mediastinal contours are stable and within normal limits. Visualized
tracheal air column is within normal limits. Left greater than right
pleural effusions have resolved. No pneumothorax, pulmonary edema or
confluent pulmonary opacity now. Stable visualized osseous
structures. Negative visible bowel gas pattern.
IMPRESSION: Resolved pleural effusions since [REDACTED]. Stable cardiac postoperative
appearance. No acute cardiopulmonary abnormality.

## 2023-03-19 NOTE — Progress Notes (Signed)
 Cardiology Office Note  Date:  03/20/2023   ID:  Chase Saunders, DOB 07-21-1950, MRN [id number]  PCP:  Merilee [email or web address], MD (Inactive)   Chief Complaint  Patient presents with   12 month follow up     Patient c/o chest pain/indigestion with activity and has taken more NTG within the past 3 month follow up.  Patient has an appointment with Dr. Trudy this Thursday to have his right groin evaluated as he pulled something.     HPI:  73 year old male with past medical history of smoking for 20-25 years who stopped 20 years ago,  diabetes,  obesity,  hypertension,  moderate aortic valve stenosis in 2015, normal ejection fraction CAD aortic valve replacement by Dr. Dasie for aortic valve stenosis with bypass grafting x2 Who presents for follow-up of his lightheadedness when bending over, fatigue, aortic valve replacement  Last office visit April 2023  Works at gas station, paresthesia, standing 8 hours Off metoprolol  and losartan , per PMD Dr. Trudy Off his lovastatin , not on Januvia , off metformin  Not checking his sugars at home No regular exercise program  Lab work reviewed from January 2024 A1C 10.2, previously in the 7 range LDL 72  Weight stable around 200 pounds Denies significant chest pain on exertion  EKG personally reviewed by myself on todays visit EKG Interpretation Date/Time:  Tuesday March 20 2023 10:39:36 EST Ventricular Rate:  91 PR Interval:  140 QRS Duration:  84 QT Interval:  394 QTC Calculation: 484 R Axis:   80  Text Interpretation: Normal sinus rhythm Normal ECG When compared with ECG of 25-Sep-2019 06:45, No significant change was found Confirmed by Perla Lye [phone]) on 03/20/2023 10:52:17 AM    Other past medical history reviewed aortic valve replacement with bypass grafting x2 September 24, 2019 Aortic Valve Replacement             Edwards Inspiris Resilia Stented Bovine Pericardial Tissue Valve (size 23mm, ref # [id number], serial #  [id number])             Left Internal Mammary Artery to Distal Left Anterior Descending Coronary Artery Saphenous Vein Graft to Second Obtuse Marginal Branch of Left Circumflex Coronary Artery Endoscopic Vein Harvest from Right Thigh   Family history Brother recently had bypass surgery    PMH:   has a past medical history of Arthritis, Asthma, Cataract, Coronary artery disease, Diabetes mellitus without complication (HCC), Heart murmur, Hyperlipidemia, Hypertension, S/P aortic valve replacement with bioprosthetic valve (09/24/2019), S/P CABG x 2 (09/24/2019), Severe aortic valve stenosis, and Shingles (09/2019).  PSH:    Past Surgical History:  Procedure Laterality Date   AORTIC VALVE REPLACEMENT N/A 09/24/2019   Procedure: AORTIC VALVE REPLACEMENT (AVR) using Celestia CROOKED Resilia 23 MM Aortic Valve;  Surgeon: Dusty Sudie DEL, MD;  Location: [facility] OR;  Service: Open Heart Surgery;  Laterality: N/A;   CARDIAC CATHETERIZATION     COLONOSCOPY     CORONARY ARTERY BYPASS GRAFT N/A 09/24/2019   Procedure: CORONARY ARTERY BYPASS GRAFTING (CABG) using LIMA to LAD; Endoscopic harvested right greater saphenous vein: SVG to OM2;  Surgeon: Dusty Sudie DEL, MD;  Location: [facility] OR;  Service: Open Heart Surgery;  Laterality: N/A;   ENDOVEIN HARVEST OF GREATER SAPHENOUS VEIN Right 09/24/2019   Procedure: ENDOVEIN HARVEST OF GREATER SAPHENOUS VEIN;  Surgeon: Dusty Sudie DEL, MD;  Location: [facility] OR;  Service: Open Heart Surgery;  Laterality: Right;   RIGHT/LEFT HEART CATH AND CORONARY ANGIOGRAPHY Bilateral 09/19/2019   Procedure: RIGHT/LEFT HEART CATH  AND CORONARY ANGIOGRAPHY;  Surgeon: Perla Evalene PARAS, MD;  Location: [facility] INVASIVE CV LAB;  Service: Cardiovascular;  Laterality: Bilateral;   TEE WITHOUT CARDIOVERSION N/A 09/24/2019   Procedure: TRANSESOPHAGEAL ECHOCARDIOGRAM (TEE);  Surgeon: Dusty Sudie DEL, MD;  Location: [facility] OR;  Service: Open Heart Surgery;  Laterality: N/A;   TONSILLECTOMY AND ADENOIDECTOMY  1969    WISDOM TOOTH EXTRACTION      Current Outpatient Medications  Medication Sig Dispense Refill   Alpha-Lipoic Acid 600 MG CAPS Take 600 mg by mouth daily.     aspirin  EC 81 MG tablet Take 81 mg by mouth at bedtime.      BERBERINE CHLORIDE PO Take by mouth daily.     Boswellia-Glucosamine-Vit D (OSTEO BI-FLEX ONE PER DAY PO) Take by mouth in the morning and at bedtime.     CINNAMON PO Take 200 mcg by mouth daily.     citalopram  (CELEXA ) 20 MG tablet Take 20 mg by mouth daily.     LANTUS SOLOSTAR 100 UNIT/ML Solostar Pen Inject 10-16 Units into the skin daily. Take 20 mg in the morning and 15 mg at bedtime     MAGNESIUM  COMPLEX HIGH POTENCY PO Take 500 mg by mouth daily.     Misc Natural Products (OSTEO BI-FLEX ADV JOINT SHIELD PO) Take by mouth daily.     Multiple Vitamins-Minerals (MENS MULTIVITAMIN PLUS) TABS Take by mouth daily.     niacinamide 500 MG tablet Take 500 mg by mouth daily.     nitroGLYCERIN  (NITROSTAT ) 0.4 MG SL tablet Place 1 tablet (0.4 mg total) under the tongue every 5 (five) minutes as needed for chest pain. 25 tablet 3   NON FORMULARY [place] 1800 mg one tablet daily     Turmeric (CURCUMIN 95) 500 MG CAPS Take 500 mg by mouth daily.     amoxicillin  (AMOXIL ) 500 MG tablet Take 500 mg by mouth. 4 tabs 1hr before dental work (Patient not taking: Reported on 03/20/2023)     Insulin  Pen Needle 31G X 8 MM MISC Use as directed (Patient not taking: Reported on 03/20/2023) 100 each 3   No current facility-administered medications for this visit.   Allergies:   Patient has no known allergies.   Social History:  The patient  reports that he quit smoking about 25 years ago. His smoking use included cigarettes. He started smoking about 51 years ago. He has a 37.5 pack-year smoking history. He has never used smokeless tobacco. He reports that he does not drink alcohol and does not use drugs.   Family History:   family history includes Alcohol abuse in his mother; Cancer (age of  onset: 34) in his father; Diabetes in his paternal grandmother; Heart disease in his father; Hypertension in his mother.   Review of Systems: Review of Systems  Constitutional: Negative.   HENT: Negative.    Respiratory: Negative.    Cardiovascular: Negative.   Gastrointestinal: Negative.   Musculoskeletal: Negative.   Neurological: Negative.   Psychiatric/Behavioral: Negative.    All other systems reviewed and are negative.  PHYSICAL EXAM: VS:  BP 126/60 (BP Location: Left Arm, Patient Position: Sitting, Cuff Size: Normal)   Pulse 91   Ht 5' 10 (1.778 m)   Wt 204 lb 6 oz (92.7 kg)   SpO2 95%   BMI 29.32 kg/m  , BMI Body mass index is 29.32 kg/m. Constitutional:  oriented to person, place, and time. No distress.  HENT:  Head: Grossly normal Eyes:  no discharge.  No scleral icterus.  Neck: No JVD, no carotid bruits  Cardiovascular: Regular rate and rhythm, 1-2/6 SEM right sternal border Pulmonary/Chest: Clear to auscultation bilaterally, no wheezes or rails Abdominal: Soft.  no distension.  no tenderness.  Musculoskeletal: Normal range of motion Neurological:  normal muscle tone. Coordination normal. No atrophy Skin: Skin warm and dry Psychiatric: normal affect, pleasant  Recent Labs: No results found for requested labs within last 365 days.    Lipid Panel Lab Results  Component Value Date   CHOL 78 09/26/2019   HDL 28 (L) 09/26/2019   LDLCALC 37 09/26/2019   TRIG 67 09/26/2019      Wt Readings from Last 3 Encounters:  03/20/23 204 lb 6 oz (92.7 kg)  06/20/21 200 lb 4 oz (90.8 kg)  06/21/20 200 lb (90.7 kg)     ASSESSMENT AND PLAN:  Problem List Items Addressed This Visit       Cardiology Problems   Severe aortic valve stenosis   Relevant Orders   EKG 12-Lead (Completed)   Mixed hyperlipidemia   Essential hypertension   Relevant Orders   EKG 12-Lead (Completed)     Other   Type 2 diabetes mellitus with hyperglycemia, with long-term current use of  insulin  (HCC)   Morbid obesity (HCC)   Other Visit Diagnoses       Coronary artery disease of native artery of native heart with stable angina pectoris (HCC)    -  Primary   Relevant Orders   EKG 12-Lead (Completed)     S/P AVR         S/P CABG (coronary artery bypass graft)       Relevant Orders   EKG 12-Lead (Completed)      Aortic valve stenosis Recent successful surgery AVR 09/2019 August 2021 echo : stable AV bioprosthetic valve Murmur on exam, repeat echocardiogram ordered  CAD with chronic stable angina History of bypass x2 Office statin, off his beta-blocker Recommend he restart lovastatin  20, lipids ordered today Goal LDL less than 55  Diabetes type 2 with complications A1c over 10 on last evaluation with primary care/Dr. Trudy Not on metformin  or Januvia , no recent A1c A1c ordered today Recommend he restart losartan  25 daily for renal protection Reports that he has follow-up with Dr. Trudy  Essential hypertension Recommend he restart losartan  given his diabetes Was previously on metoprolol , will continue to hold for now  Hyperlipidemia Goal LDL less than 55, recommend he start lovastatin  20 Was previously on lovastatin  40 Lipids ordered today   Signed, Velinda Lunger, M.D., Ph.D. [facility] [place], [place] [phone]

## 2023-03-20 ENCOUNTER — Encounter: Payer: Self-pay | Admitting: Cardiovascular Disease

## 2023-03-20 ENCOUNTER — Ambulatory Visit: Payer: Medicare HMO | Attending: Cardiovascular Disease | Admitting: Cardiovascular Disease

## 2023-03-20 VITALS — BP 126/60 | HR 91 | Ht 70.0 in | Wt 204.4 lb

## 2023-03-20 DIAGNOSIS — Z952 Presence of prosthetic heart valve: Secondary | ICD-10-CM

## 2023-03-20 DIAGNOSIS — E1165 Type 2 diabetes mellitus with hyperglycemia: Secondary | ICD-10-CM | POA: Diagnosis not present

## 2023-03-20 DIAGNOSIS — Z79899 Other long term (current) drug therapy: Secondary | ICD-10-CM

## 2023-03-20 DIAGNOSIS — I10 Essential (primary) hypertension: Secondary | ICD-10-CM

## 2023-03-20 DIAGNOSIS — I25118 Atherosclerotic heart disease of native coronary artery with other forms of angina pectoris: Secondary | ICD-10-CM | POA: Diagnosis not present

## 2023-03-20 DIAGNOSIS — Z951 Presence of aortocoronary bypass graft: Secondary | ICD-10-CM

## 2023-03-20 DIAGNOSIS — I350 Nonrheumatic aortic (valve) stenosis: Secondary | ICD-10-CM

## 2023-03-20 DIAGNOSIS — E782 Mixed hyperlipidemia: Secondary | ICD-10-CM

## 2023-03-20 DIAGNOSIS — Z794 Long term (current) use of insulin: Secondary | ICD-10-CM

## 2023-03-20 MED ORDER — LOVASTATIN ER 20 MG PO TB24: 20.0000 mg | ORAL_TABLET | Freq: Every day | ORAL | 3 refills | Status: DC

## 2023-03-20 MED ORDER — LOSARTAN POTASSIUM 25 MG PO TABS: 25.0000 mg | ORAL_TABLET | Freq: Every day | ORAL | 3 refills | Status: AC

## 2023-03-20 MED ORDER — NITROGLYCERIN 0.4 MG SL SUBL: 0.4000 mg | SUBLINGUAL_TABLET | SUBLINGUAL | 3 refills | Status: AC | PRN

## 2023-03-20 MED ORDER — LOVASTATIN 20 MG PO TABS: 20.0000 mg | ORAL_TABLET | Freq: Every day | ORAL | 3 refills | Status: DC

## 2023-03-20 NOTE — Patient Instructions (Addendum)
 Medication Instructions:  Please restart losartan  25 mg daily Please restart lovastatin  20 mg daily  If you need a refill on your cardiac medications before your next appointment, please call your pharmacy.   Lab work: Your provider would like for you to have following labs drawn today A1C and Lipid Panel.    Testing/Procedures: Your physician has requested that you have an echocardiogram. Echocardiography is a painless test that uses sound waves to create images of your heart. It provides your doctor with information about the size and shape of your heart and how well your heart's chambers and valves are working.   You may receive an ultrasound enhancing agent through an IV if needed to better visualize your heart during the echo. This procedure takes approximately one hour.  There are no restrictions for this procedure.  This will take place at [address] [facility] [address], [place] [address]  Please note: We ask at that you not bring children with you during ultrasound (echo/ vascular) testing. Due to room size and safety concerns, children are not allowed in the ultrasound rooms during exams. Our front office staff cannot provide observation of children in our lobby area while testing is being conducted. An adult accompanying a patient to their appointment will only be allowed in the ultrasound room at the discretion of the ultrasound technician under special circumstances. We apologize for any inconvenience.    Follow-Up: At [facility], you and your health needs are our priority.  As part of our continuing mission to provide you with exceptional heart care, we have created designated Provider Care Teams.  These Care Teams include your primary Cardiologist (physician) and Advanced Practice Providers (APPs -  Physician Assistants and Nurse Practitioners) who all work together to provide you with the care you need, when you need it.  You will need a follow up  appointment in 12 months  Providers on your designated Care Team:   Lonni Meager, NP Bernardino Bring, PA-C Cadence [place]ster, [place]  COVID-19 Vaccine Information can be found at: [email or web address] For questions related to vaccine distribution or appointments, please email [email or web address] or call [phone].

## 2023-03-21 LAB — LIPID PANEL
Chol/HDL Ratio: 3.9 {ratio} (ref 0.0–5.0)
Cholesterol, Total: 168 mg/dL (ref 100–199)
HDL: 43 mg/dL (ref >39)
LDL Chol Calc (NIH): 108 mg/dL — ABNORMAL HIGH (ref 0–99)
Triglycerides: 91 mg/dL (ref 0–149)
VLDL Cholesterol Cal: 17 mg/dL (ref 5–40)

## 2023-03-21 LAB — HEMOGLOBIN A1C
Est. average glucose Bld gHb Est-mCnc: 223 mg/dL
Hgb A1c MFr Bld: 9.4 % — ABNORMAL HIGH (ref 4.8–5.6)

## 2023-03-23 ENCOUNTER — Other Ambulatory Visit: Payer: Self-pay | Admitting: Emergency Medicine

## 2023-03-23 ENCOUNTER — Encounter: Payer: Self-pay | Admitting: Cardiovascular Disease

## 2023-03-23 MED ORDER — LOVASTATIN 40 MG PO TABS: 40.0000 mg | ORAL_TABLET | Freq: Every day | ORAL | 3 refills | Status: AC

## 2023-04-03 ENCOUNTER — Ambulatory Visit
Admission: EM | Admit: 2023-04-03 | Discharge: 2023-04-03 | Disposition: A | Discharge status: Home / Self Care | Payer: Medicare HMO | Attending: Emergency Medicine | Admitting: Emergency Medicine

## 2023-04-03 DIAGNOSIS — J069 Acute upper respiratory infection, unspecified: Secondary | ICD-10-CM

## 2023-04-03 MED ORDER — AZITHROMYCIN 250 MG PO TABS: 250.0000 mg | ORAL_TABLET | Freq: Every day | ORAL | 0 refills | Status: AC

## 2023-04-03 MED ORDER — BENZONATATE 100 MG PO CAPS: 100.0000 mg | ORAL_CAPSULE | Freq: Three times a day (TID) | ORAL | 0 refills | Status: AC

## 2023-04-03 NOTE — ED Provider Notes (Signed)
Chase Saunders    CSN: [id number] Arrival date & time: 04/03/23  1310      History   Chief Complaint Chief Complaint  Patient presents with   Cough    HPI Chase Saunders is a 73 y.o. male.   Patient presents for evaluation of nasal congestion, rhinorrhea, productive cough, wheezing and sore throat present for 6 days.  Wife reporting illness for 14 days.  Has been using DayQuil and NyQuil.  Eating food and liquids.  Denies shortness of breath, or fever.  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   S/P aortic valve replacement with bioprosthetic valve 09/24/2019   Coronary artery disease    Unstable angina (HCC) 09/19/2019   Severe aortic valve stenosis    Encounter to establish care 08/21/2012   Dizziness 08/21/2012   Type 2 diabetes mellitus with hyperglycemia, with long-term current use of insulin (HCC) 08/21/2012   Mixed hyperlipidemia 08/21/2012   Essential hypertension 08/21/2012   Morbid obesity (HCC) 08/21/2012    Past Surgical History:  Procedure Laterality Date   AORTIC VALVE REPLACEMENT N/A 09/24/2019   Procedure: AORTIC VALVE REPLACEMENT (AVR) using Cyndee Brightly Resilia 23 MM Aortic Valve;  Surgeon: Purcell Nails, MD;  Location: [facility] OR;  Service: Open Heart Surgery;  Laterality: N/A;   CARDIAC CATHETERIZATION     COLONOSCOPY     CORONARY ARTERY BYPASS GRAFT N/A 09/24/2019   Procedure: CORONARY ARTERY BYPASS GRAFTING (CABG) using LIMA to LAD; Endoscopic harvested right greater  saphenous vein: SVG to OM2;  Surgeon: Purcell Nails, MD;  Location: [facility] OR;  Service: Open Heart Surgery;  Laterality: N/A;   ENDOVEIN HARVEST OF GREATER SAPHENOUS VEIN Right 09/24/2019   Procedure: ENDOVEIN HARVEST OF GREATER SAPHENOUS VEIN;  Surgeon: Purcell Nails, MD;  Location: [facility] OR;  Service: Open Heart Surgery;  Laterality: Right;   RIGHT/LEFT HEART CATH AND CORONARY ANGIOGRAPHY Bilateral 09/19/2019   Procedure: RIGHT/LEFT HEART CATH AND CORONARY ANGIOGRAPHY;  Surgeon: Antonieta Iba, MD;  Location: [facility] INVASIVE CV LAB;  Service: Cardiovascular;  Laterality: Bilateral;   TEE WITHOUT CARDIOVERSION N/A 09/24/2019   Procedure: TRANSESOPHAGEAL ECHOCARDIOGRAM (TEE);  Surgeon: Purcell Nails, MD;  Location: [facility] OR;  Service: Open Heart Surgery;  Laterality: N/A;   TONSILLECTOMY AND ADENOIDECTOMY  1969   WISDOM TOOTH EXTRACTION         Home Medications    Prior to Admission medications   Medication Sig Start Date End Date Taking? Authorizing Provider  azithromycin (ZITHROMAX) 250 MG tablet Take 1 tablet (250 mg total) by mouth daily. Take first 2 tablets together, then 1 every day until finished. 04/03/23  Yes Samiya Mervin R, NP  benzonatate (TESSALON) 100 MG capsule Take 1 capsule (100 mg total) by mouth every 8 (eight) hours. 04/03/23  Yes Lindsie Simar, Elita Boone, NP  Alpha-Lipoic Acid 600 MG CAPS Take  600 mg by mouth daily.    [provider]  amoxicillin (AMOXIL) 500 MG tablet Take 500 mg by mouth. 4 tabs 1hr before dental work Patient not taking: Reported on 03/20/2023    [provider]  aspirin EC 81 MG tablet Take 81 mg by mouth at bedtime.     [provider]  BERBERINE CHLORIDE PO Take by mouth daily.    [provider]  Boswellia-Glucosamine-Vit D (OSTEO BI-FLEX ONE PER DAY PO) Take by mouth in the morning and at bedtime.    [provider]  CINNAMON PO Take 200 mcg by mouth daily.    [provider]  citalopram (CELEXA) 20  MG tablet Take 20 mg by mouth daily.    [provider]  Insulin Pen Needle 31G X 8 MM MISC Use as directed Patient not taking: Reported on 03/20/2023 06/03/13   Rey, Richarda Overlie, NP  LANTUS SOLOSTAR 100 UNIT/ML Solostar Pen Inject 10-16 Units into the skin daily. Take 20 mg in the morning and 15 mg at bedtime 09/05/19   [provider]  losartan (COZAAR) 25 MG tablet Take 1 tablet (25 mg total) by mouth daily. Take 1 tablet (25 mg) by mouth once daily 03/20/23   Antonieta Iba, MD  lovastatin (MEVACOR) 40 MG tablet Take 1 tablet (40 mg total) by mouth at bedtime. 03/23/23   Antonieta Iba, MD  MAGNESIUM COMPLEX HIGH POTENCY PO Take 500 mg by mouth daily.    [provider]  Misc Natural Products (OSTEO BI-FLEX ADV JOINT SHIELD PO) Take by mouth daily.    [provider]  Multiple Vitamins-Minerals (MENS MULTIVITAMIN PLUS) TABS Take by mouth daily.    [provider]  niacinamide 500 MG tablet Take 500 mg by mouth daily.    [provider]  nitroGLYCERIN (NITROSTAT) 0.4 MG SL tablet Place 1 tablet (0.4 mg total) under the tongue every 5 (five) minutes as needed for chest pain. 03/20/23   Antonieta Iba, MD  NON FORMULARY [place] 1800 mg one tablet daily    [provider]  Turmeric (CURCUMIN 95) 500 MG CAPS Take 500 mg by mouth daily.    [provider]  furosemide (LASIX) 40 MG tablet Take 1/2 (one-half) tablet by mouth once daily Patient not taking: Reported on 03/20/2023 02/23/20 05/03/20  Alver Sorrow, NP  sitaGLIPtin (JANUVIA) 100 MG tablet Take 1 tablet (100 mg total) by mouth daily. Patient not taking: Reported on 03/20/2023 10/01/19 05/03/20  Barrett, Rae Roam, PA-C    Family History Family History  Problem Relation Age of Onset   Alcohol abuse Mother    Hypertension Mother    Cancer Father 48       Lung Cancer - smoker   Heart disease Father        Valve Replacement   Diabetes Paternal Grandmother      Social History Social History   Tobacco Use   Smoking status: Former    Current packs/day: 0.00    Average packs/day: 1.5 packs/day for 25.0 years (37.5 ttl pk-yrs)    Types: Cigarettes    Start date: 03/30/1972    Quit date: 03/30/1997    Years since quitting: 26.0   Smokeless tobacco: Never  Vaping Use   Vaping status: Never Used  Substance Use Topics   Alcohol use: Never   Drug use: No     Allergies   Patient has no known allergies.   Review of Systems  Review of Systems   Physical Exam Triage Vital Signs ED Triage Vitals  Encounter Vitals Group     BP 04/03/23 1424 117/67     Systolic BP Percentile --      Diastolic BP Percentile --      Pulse Rate 04/03/23 1424 85     Resp 04/03/23 1424 18     Temp 04/03/23 1424 99.4 F (37.4 C)     Temp Source 04/03/23 1424 Oral     SpO2 04/03/23 1424 93 %     Weight --      Height --      Head Circumference --      Peak Flow --      Pain Score 04/03/23 1355 8     Pain Loc --      Pain Education --      Exclude from Growth Chart --    No data found.  Updated Vital Signs BP 117/67 (BP Location: Left Arm)   Pulse 85   Temp 99.4 F (37.4 C) (Oral)   Resp 18   SpO2 93%   Visual Acuity Right Eye Distance:   Left Eye Distance:   Bilateral Distance:    Right Eye Near:   Left Eye Near:    Bilateral Near:     Physical Exam Constitutional:      Appearance: Normal appearance.  HENT:     Right Ear: Tympanic membrane, ear canal and external ear normal.     Left Ear: Tympanic membrane, ear canal and external ear normal.     Nose: Congestion present. No rhinorrhea.     Mouth/Throat:     Pharynx: No oropharyngeal exudate or posterior oropharyngeal erythema.  Eyes:     Extraocular Movements: Extraocular movements intact.  Cardiovascular:     Rate and Rhythm: Normal rate and regular rhythm.     Pulses: Normal pulses.     Heart sounds: Normal heart sounds.  Pulmonary:     Effort: Pulmonary effort is normal.      Breath sounds: Normal breath sounds.  Neurological:     Mental Status: He is alert and oriented to person, place, and time. Mental status is at baseline.      UC Treatments / Results  Labs (all labs ordered are listed, but only abnormal results are displayed) Labs Reviewed - No data to display  EKG   Radiology No results found.  Procedures Procedures (including critical care time)  Medications Ordered in UC Medications - No data to display  Initial Impression / Assessment and Plan / UC Course  I have reviewed the triage vital signs and the nursing notes.  Pertinent labs & imaging results that were available during my care of the patient were reviewed by me and considered in my medical decision making (see chart for details).  Acute URI  Patient is in no signs of distress nor toxic appearing.  Vital signs are stable.  Low suspicion for pneumonia, pneumothorax or bronchitis and therefore will defer imaging.  Prescribed azithromycin and Tessalon.May use additional over-the-counter medications as needed for supportive care.  May follow-up with urgent care as needed if symptoms persist or worsen.   Final Clinical Impressions(s) / UC Diagnoses   Final diagnoses:  Acute URI     Discharge Instructions      Begin Azithromycin to cover for bacteria which may be causing symptoms to linger  You may take Tessalon pill every 8 hours as needed for cough    You can  take Tylenol and/or Ibuprofen as needed for fever reduction and pain relief.   For cough: honey 1/2 to 1 teaspoon (you can dilute the honey in water or another fluid).  You can also use guaifenesin and dextromethorphan for cough. You can use a humidifier for chest congestion and cough.  If you don't have a humidifier, you can sit in the bathroom with the hot shower running.      For sore throat: try warm salt water gargles, cepacol lozenges, throat spray, warm tea or water with lemon/honey, popsicles or ice, or OTC  cold relief medicine for throat discomfort.   For congestion: take a daily anti-histamine like Zyrtec, Claritin, and a oral decongestant, such as pseudoephedrine.  You can also use Flonase 1-2 sprays in each nostril daily.   It is important to stay hydrated: drink plenty of fluids (water, gatorade/powerade/pedialyte, juices, or teas) to keep your throat moisturized and help further relieve irritation/discomfort.    ED Prescriptions     Medication Sig Dispense Auth. Provider   azithromycin (ZITHROMAX) 250 MG tablet Take 1 tablet (250 mg total) by mouth daily. Take first 2 tablets together, then 1 every day until finished. 6 tablet Khari Lett R, NP   benzonatate (TESSALON) 100 MG capsule Take 1 capsule (100 mg total) by mouth every 8 (eight) hours. 21 capsule Derrich Gaby, Elita Boone, NP      PDMP not reviewed this encounter.   Valinda Hoar, NP 04/03/23 [phone]

## 2023-04-03 NOTE — ED Triage Notes (Addendum)
Patient to Urgent Care with complaints of deep cough and chest congestion/ runny nose/ headache. Possible fevers. Poor appetite.  Reports symptoms started six days ago.  Taking Mucinex.

## 2023-04-03 NOTE — Discharge Instructions (Signed)
Begin Azithromycin to cover for bacteria which may be causing symptoms to linger  You may take Tessalon pill every 8 hours as needed for cough    You can take Tylenol and/or Ibuprofen as needed for fever reduction and pain relief.   For cough: honey 1/2 to 1 teaspoon (you can dilute the honey in water or another fluid).  You can also use guaifenesin and dextromethorphan for cough. You can use a humidifier for chest congestion and cough.  If you don't have a humidifier, you can sit in the bathroom with the hot shower running.      For sore throat: try warm salt water gargles, cepacol lozenges, throat spray, warm tea or water with lemon/honey, popsicles or ice, or OTC cold relief medicine for throat discomfort.   For congestion: take a daily anti-histamine like Zyrtec, Claritin, and a oral decongestant, such as pseudoephedrine.  You can also use Flonase 1-2 sprays in each nostril daily.   It is important to stay hydrated: drink plenty of fluids (water, gatorade/powerade/pedialyte, juices, or teas) to keep your throat moisturized and help further relieve irritation/discomfort.

## 2023-04-05 ENCOUNTER — Ambulatory Visit: Payer: Medicare HMO

## 2023-04-19 ENCOUNTER — Ambulatory Visit: Payer: Medicare HMO | Attending: Cardiovascular Disease

## 2023-04-19 DIAGNOSIS — Z952 Presence of prosthetic heart valve: Secondary | ICD-10-CM

## 2023-04-19 LAB — ECHOCARDIOGRAM COMPLETE
AV Mean grad: 17 mm[Hg]
AV Peak grad: 29.6 mm[Hg]
Ao pk vel: 2.72 m/s
Area-P 1/2: 3.77 cm2
S' Lateral: 3.2 cm

## 2023-04-20 ENCOUNTER — Encounter: Payer: Self-pay | Admitting: Cardiovascular Disease

## 2023-04-23 ENCOUNTER — Other Ambulatory Visit: Payer: Self-pay | Admitting: Emergency Medicine

## 2023-04-23 DIAGNOSIS — Z953 Presence of xenogenic heart valve: Secondary | ICD-10-CM

## 2023-05-23 ENCOUNTER — Other Ambulatory Visit: Payer: Medicare HMO

## 2023-08-20 ENCOUNTER — Other Ambulatory Visit: Payer: Self-pay | Admitting: *Deleted

## 2023-08-20 ENCOUNTER — Telehealth: Payer: Self-pay | Admitting: Cardiovascular Disease

## 2023-08-20 MED ORDER — AMOXICILLIN 500 MG PO TABS: 500.0000 mg | ORAL_TABLET | Freq: Once | ORAL | 0 refills | Status: DC

## 2023-08-20 NOTE — Progress Notes (Signed)
 Pt requesting Amoxicillin  to take prior to dental work

## 2023-08-20 NOTE — Telephone Encounter (Signed)
 Spoke with patient.  Pt is scheduled for dental work and requested a refill for Amoxicillin  500mg  (4 tabs) to take prior to appointment.  Re-ordered and sent to [facility].  Pt very appreciative of return call and follow up.

## 2023-08-20 NOTE — Telephone Encounter (Signed)
*  STAT* If patient is at the pharmacy, call can be transferred to refill team.   1. Which medications need to be refilled? (please list name of each medication and dose if known)  amoxicillin  (AMOXIL ) 500 MG tablet   2. Which pharmacy/location (including street and city if local pharmacy) is medication to be sent to? [facility] [id number] - [place], [place] - [address]  3. Do they need a 30 day or 90 day supply?  Standard supply for future dental work.

## 2023-08-20 NOTE — Telephone Encounter (Signed)
 Pt of Dr. Gollan. Would Dr. Gollan like to refill this Non Cardiac RX? Please advise

## 2023-08-30 DIAGNOSIS — K000 Anodontia: Secondary | ICD-10-CM | POA: Diagnosis not present

## 2023-09-04 DIAGNOSIS — H18593 Other hereditary corneal dystrophies, bilateral: Secondary | ICD-10-CM | POA: Diagnosis not present

## 2023-09-04 DIAGNOSIS — H401131 Primary open-angle glaucoma, bilateral, mild stage: Secondary | ICD-10-CM | POA: Diagnosis not present

## 2023-09-04 DIAGNOSIS — H26492 Other secondary cataract, left eye: Secondary | ICD-10-CM | POA: Diagnosis not present

## 2023-09-04 DIAGNOSIS — H18413 Arcus senilis, bilateral: Secondary | ICD-10-CM | POA: Diagnosis not present

## 2023-09-10 ENCOUNTER — Other Ambulatory Visit (HOSPITAL_COMMUNITY): Payer: Self-pay

## 2023-09-10 MED ORDER — AMOXICILLIN 500 MG PO TABS
500.0000 mg | ORAL_TABLET | Freq: Once | ORAL | 0 refills | Status: DC
  Filled 2023-09-10: qty 4, 4d supply, fill #0

## 2023-09-10 MED ORDER — AMOXICILLIN 500 MG PO TABS: 500.0000 mg | ORAL_TABLET | Freq: Once | ORAL | 0 refills | Status: DC

## 2023-09-10 NOTE — Addendum Note (Signed)
 Addended by: DASIE SHARLET RAMAN on: 09/10/2023 11:12 AM   Modules accepted: Orders

## 2023-09-10 NOTE — Telephone Encounter (Signed)
 Notified patient that refill has been sent into his pharmacy and he was very appreciative with no further needs.

## 2023-09-10 NOTE — Telephone Encounter (Signed)
 Pt requesting another refill for Amoxicillin  for his upcoming dental appt.

## 2023-10-09 ENCOUNTER — Other Ambulatory Visit: Payer: Self-pay | Admitting: Cardiovascular Disease

## 2023-10-11 NOTE — Telephone Encounter (Signed)
 For review

## 2023-10-22 DIAGNOSIS — Z0100 Encounter for examination of eyes and vision without abnormal findings: Secondary | ICD-10-CM | POA: Diagnosis not present

## 2023-10-31 ENCOUNTER — Ambulatory Visit
Admission: RE | Admit: 2023-10-31 | Discharge: 2023-10-31 | Disposition: A | Discharge status: Home / Self Care | Source: Ambulatory Visit | Attending: Physician Assistant | Admitting: Physician Assistant

## 2023-10-31 ENCOUNTER — Other Ambulatory Visit: Payer: Self-pay | Admitting: Physician Assistant

## 2023-10-31 DIAGNOSIS — M24811 Other specific joint derangements of right shoulder, not elsewhere classified: Secondary | ICD-10-CM | POA: Diagnosis not present

## 2023-10-31 DIAGNOSIS — M778 Other enthesopathies, not elsewhere classified: Secondary | ICD-10-CM | POA: Diagnosis not present

## 2023-10-31 DIAGNOSIS — I700 Atherosclerosis of aorta: Secondary | ICD-10-CM | POA: Diagnosis not present

## 2023-10-31 DIAGNOSIS — M19011 Primary osteoarthritis, right shoulder: Secondary | ICD-10-CM | POA: Diagnosis not present

## 2023-11-02 DIAGNOSIS — M19011 Primary osteoarthritis, right shoulder: Secondary | ICD-10-CM | POA: Diagnosis not present

## 2023-11-02 DIAGNOSIS — M19012 Primary osteoarthritis, left shoulder: Secondary | ICD-10-CM | POA: Diagnosis not present

## 2024-01-17 ENCOUNTER — Other Ambulatory Visit: Payer: Self-pay | Admitting: Emergency Medicine

## 2024-01-17 DIAGNOSIS — Z953 Presence of xenogenic heart valve: Secondary | ICD-10-CM

## 2024-01-18 ENCOUNTER — Telehealth: Payer: Self-pay | Admitting: Cardiovascular Disease

## 2024-01-18 NOTE — Telephone Encounter (Signed)
LVM to schedule echo appt 

## 2024-01-18 NOTE — Telephone Encounter (Signed)
-----   Message from Nurse Olivia HERO sent at 01/17/2024  4:29 PM EST ----- New order has been placed ----- Message ----- From: Celine Arsenio GAILS Sent: 01/17/2024   4:26 PM EST To: Olivia JINNY Lager, RN  Order was cancelled please put in new order. Thanks ----- Message ----- From: Lager Olivia JINNY, RN Sent: 01/17/2024   4:15 PM EST To: Arsenio Celine V  There is an order in for an Echo. It expires 04/22/24. ----- Message ----- From: Celine Arsenio GAILS Sent: 01/17/2024   1:38 PM EST To: Olivia JINNY Lager, RN  Need order for echo please thanks ----- Message ----- From: Lager Olivia JINNY, RN Sent: 01/17/2024   8:57 AM EST To: [facility] Scheduling  Will you scheduled patient for an echo in Feb please?

## 2024-03-19 NOTE — Progress Notes (Signed)
 ONEILL BAIS                                          MRN: [id number]   03/19/2024   The [facility] Quality Team Specialist reviewed this patient medical record for the purposes of chart review for care gap closure. The following were reviewed: chart review for care gap closure-glycemic status assessment.    [facility] Quality Team

## 2024-04-07 ENCOUNTER — Ambulatory Visit

## 2024-04-21 ENCOUNTER — Ambulatory Visit
